# Patient Record
Sex: Male | Born: 1965 | Race: White | Hispanic: No | Smoking: Current every day smoker
Health system: Southern US, Community
[De-identification: ages and names within clinical notes are randomized; demographics above are authoritative.]

## PROBLEM LIST (undated history)

## (undated) DIAGNOSIS — F32A Depression, unspecified: Secondary | ICD-10-CM

## (undated) DIAGNOSIS — E785 Hyperlipidemia, unspecified: Secondary | ICD-10-CM

## (undated) DIAGNOSIS — K859 Acute pancreatitis without necrosis or infection, unspecified: Secondary | ICD-10-CM

## (undated) DIAGNOSIS — F419 Anxiety disorder, unspecified: Secondary | ICD-10-CM

## (undated) DIAGNOSIS — G4730 Sleep apnea, unspecified: Secondary | ICD-10-CM

## (undated) DIAGNOSIS — Z720 Tobacco use: Secondary | ICD-10-CM

## (undated) DIAGNOSIS — M1990 Unspecified osteoarthritis, unspecified site: Secondary | ICD-10-CM

## (undated) DIAGNOSIS — F329 Major depressive disorder, single episode, unspecified: Secondary | ICD-10-CM

## (undated) DIAGNOSIS — I10 Essential (primary) hypertension: Secondary | ICD-10-CM

## (undated) DIAGNOSIS — K219 Gastro-esophageal reflux disease without esophagitis: Secondary | ICD-10-CM

## (undated) HISTORY — DX: Hyperlipidemia, unspecified: E78.5

## (undated) HISTORY — PX: FRACTURE SURGERY: SHX138

## (undated) HISTORY — DX: Depression, unspecified: F32.A

## (undated) HISTORY — DX: Anxiety disorder, unspecified: F41.9

## (undated) HISTORY — PX: JOINT REPLACEMENT: SHX530

## (undated) HISTORY — DX: Gastro-esophageal reflux disease without esophagitis: K21.9

## (undated) HISTORY — DX: Acute pancreatitis without necrosis or infection, unspecified: K85.90

## (undated) HISTORY — DX: Major depressive disorder, single episode, unspecified: F32.9

## (undated) HISTORY — DX: Tobacco use: Z72.0

## (undated) HISTORY — DX: Sleep apnea, unspecified: G47.30

## (undated) HISTORY — PX: EYE SURGERY: SHX253

## (undated) HISTORY — DX: Essential (primary) hypertension: I10

## (undated) HISTORY — DX: Unspecified osteoarthritis, unspecified site: M19.90

---

## 1998-05-24 ENCOUNTER — Ambulatory Visit (HOSPITAL_BASED_OUTPATIENT_CLINIC_OR_DEPARTMENT_OTHER): Admission: RE | Admit: 1998-05-24 | Discharge: 1998-05-24 | Payer: Self-pay | Admitting: Specialist

## 2000-11-05 ENCOUNTER — Emergency Department (HOSPITAL_COMMUNITY): Admission: EM | Admit: 2000-11-05 | Discharge: 2000-11-06 | Payer: Self-pay | Admitting: Emergency Medicine

## 2000-11-14 ENCOUNTER — Encounter: Admission: RE | Admit: 2000-11-14 | Discharge: 2000-11-14 | Payer: Self-pay | Admitting: *Deleted

## 2000-11-14 ENCOUNTER — Encounter: Payer: Self-pay | Admitting: *Deleted

## 2008-02-28 ENCOUNTER — Emergency Department (HOSPITAL_COMMUNITY): Admission: EM | Admit: 2008-02-28 | Discharge: 2008-02-28 | Payer: Self-pay | Admitting: Emergency Medicine

## 2008-06-30 ENCOUNTER — Emergency Department (HOSPITAL_COMMUNITY): Admission: EM | Admit: 2008-06-30 | Discharge: 2008-06-30 | Payer: Self-pay | Admitting: Emergency Medicine

## 2009-07-07 ENCOUNTER — Encounter: Payer: Self-pay | Admitting: Emergency Medicine

## 2009-07-07 ENCOUNTER — Ambulatory Visit: Payer: Self-pay | Admitting: Family Medicine

## 2009-07-08 ENCOUNTER — Inpatient Hospital Stay (HOSPITAL_COMMUNITY): Admission: EM | Admit: 2009-07-08 | Discharge: 2009-07-09 | Payer: Self-pay | Admitting: Family Medicine

## 2010-11-09 LAB — CBC AND DIFFERENTIAL: WBC: 10.8 10^3/mL

## 2010-11-09 LAB — HEPATIC FUNCTION PANEL
ALT: 29 U/L (ref 10–40)
AST: 31 U/L (ref 14–40)
Bilirubin, Total: 0.5 mg/dL

## 2010-11-09 LAB — BASIC METABOLIC PANEL
BUN: 11 mg/dL (ref 4–21)
Creatinine: 0.8 mg/dL (ref <=1.3)
Glucose: 90 mg/dL
Sodium: 138 mmol/L (ref 137–147)

## 2010-11-09 LAB — LIPID PANEL
HDL: 38 mg/dL (ref 35–70)
LDl/HDL Ratio: 4.4

## 2011-01-20 LAB — LIPASE, BLOOD: Lipase: 1539 U/L — ABNORMAL HIGH (ref 11–59)

## 2011-01-20 LAB — CBC
HCT: 40.8 % (ref 39.0–52.0)
HCT: 33.5 % — ABNORMAL LOW (ref 39.0–52.0)
HCT: 35.1 % — ABNORMAL LOW (ref 39.0–52.0)
Hemoglobin: 14.3 g/dL (ref 13.0–17.0)
Hemoglobin: 12.4 g/dL — ABNORMAL LOW (ref 13.0–17.0)
MCHC: 35.1 g/dL (ref 30.0–36.0)
MCHC: 35.3 g/dL (ref 30.0–36.0)
MCV: 91.8 fL (ref 78.0–100.0)
MCV: 92.7 fL (ref 78.0–100.0)
MCV: 92.9 fL (ref 78.0–100.0)
Platelets: 179 10*3/uL (ref 150–400)
RBC: 4.44 MIL/uL (ref 4.22–5.81)
RBC: 3.6 MIL/uL — ABNORMAL LOW (ref 4.22–5.81)
RBC: 3.78 MIL/uL — ABNORMAL LOW (ref 4.22–5.81)
RDW: 13.2 % (ref 11.5–15.5)
RDW: 13.2 % (ref 11.5–15.5)
WBC: 16.7 10*3/uL — ABNORMAL HIGH (ref 4.0–10.5)
WBC: 10.8 10*3/uL — ABNORMAL HIGH (ref 4.0–10.5)

## 2011-01-20 LAB — DIFFERENTIAL
Basophils Absolute: 0 10*3/uL (ref 0.0–0.1)
Basophils Relative: 0 % (ref 0–1)
Lymphocytes Relative: 11 % — ABNORMAL LOW (ref 12–46)
Monocytes Absolute: 0.6 10*3/uL (ref 0.1–1.0)
Neutro Abs: 14.2 10*3/uL — ABNORMAL HIGH (ref 1.7–7.7)
Neutrophils Relative %: 85 % — ABNORMAL HIGH (ref 43–77)

## 2011-01-20 LAB — COMPREHENSIVE METABOLIC PANEL
AST: 74 U/L — ABNORMAL HIGH (ref 0–37)
Albumin: 3.8 g/dL (ref 3.5–5.2)
Albumin: 2.8 g/dL — ABNORMAL LOW (ref 3.5–5.2)
Albumin: 3.1 g/dL — ABNORMAL LOW (ref 3.5–5.2)
Alkaline Phosphatase: 143 U/L — ABNORMAL HIGH (ref 39–117)
Alkaline Phosphatase: 77 U/L (ref 39–117)
BUN: 9 mg/dL (ref 6–23)
BUN: 5 mg/dL — ABNORMAL LOW (ref 6–23)
CO2: 29 mEq/L (ref 19–32)
Calcium: 8.4 mg/dL (ref 8.4–10.5)
Chloride: 102 mEq/L (ref 96–112)
Chloride: 100 mEq/L (ref 96–112)
Creatinine, Ser: 0.82 mg/dL (ref 0.4–1.5)
Creatinine, Ser: 0.76 mg/dL (ref 0.4–1.5)
Creatinine, Ser: 0.79 mg/dL (ref 0.4–1.5)
GFR calc Af Amer: >60 mL/min (ref >60)
GFR calc non Af Amer: >60 mL/min (ref >60)
Glucose, Bld: 97 mg/dL (ref 70–99)
Glucose, Bld: 79 mg/dL (ref 70–99)
Potassium: 3.1 mEq/L — ABNORMAL LOW (ref 3.5–5.1)
Potassium: 3.9 mEq/L (ref 3.5–5.1)
Total Bilirubin: 1.6 mg/dL — ABNORMAL HIGH (ref 0.3–1.2)
Total Bilirubin: 1.2 mg/dL (ref 0.3–1.2)
Total Protein: 7.3 g/dL (ref 6.0–8.3)
Total Protein: 6.1 g/dL (ref 6.0–8.3)

## 2011-01-20 LAB — LIPID PANEL
Cholesterol: 82 mg/dL (ref 0–200)
LDL Cholesterol: 32 mg/dL (ref 0–99)
LDL Cholesterol: 7 mg/dL (ref 0–99)
Total CHOL/HDL Ratio: 3.4 RATIO
Triglycerides: 266 mg/dL — ABNORMAL HIGH (ref <150)
VLDL: 24 mg/dL (ref 0–40)
VLDL: 53 mg/dL — ABNORMAL HIGH (ref 0–40)

## 2011-01-20 LAB — URINALYSIS, ROUTINE W REFLEX MICROSCOPIC
Glucose, UA: NEGATIVE mg/dL
Hgb urine dipstick: NEGATIVE
Ketones, ur: 40 mg/dL — AB
Leukocytes, UA: NEGATIVE
Nitrite: NEGATIVE
Protein, ur: 30 mg/dL — AB
Specific Gravity, Urine: 1.031 — ABNORMAL HIGH (ref 1.005–1.030)
Urobilinogen, UA: 1 mg/dL (ref 0.0–1.0)
pH: 5.5 (ref 5.0–8.0)

## 2011-01-20 LAB — URINE MICROSCOPIC-ADD ON

## 2011-03-02 ENCOUNTER — Inpatient Hospital Stay (HOSPITAL_COMMUNITY)
Admission: EM | Admit: 2011-03-02 | Discharge: 2011-03-07 | DRG: 494 | Disposition: A | Discharge status: Home Health | Payer: PRIVATE HEALTH INSURANCE | Attending: Orthopaedic Surgery | Admitting: Orthopaedic Surgery

## 2011-03-02 ENCOUNTER — Emergency Department (HOSPITAL_COMMUNITY): Payer: PRIVATE HEALTH INSURANCE

## 2011-03-02 DIAGNOSIS — E7800 Pure hypercholesterolemia, unspecified: Secondary | ICD-10-CM | POA: Diagnosis present

## 2011-03-02 DIAGNOSIS — F411 Generalized anxiety disorder: Secondary | ICD-10-CM | POA: Diagnosis present

## 2011-03-02 DIAGNOSIS — Y92009 Unspecified place in unspecified non-institutional (private) residence as the place of occurrence of the external cause: Secondary | ICD-10-CM

## 2011-03-02 DIAGNOSIS — K219 Gastro-esophageal reflux disease without esophagitis: Secondary | ICD-10-CM | POA: Diagnosis present

## 2011-03-02 DIAGNOSIS — W03XXXA Other fall on same level due to collision with another person, initial encounter: Secondary | ICD-10-CM | POA: Diagnosis present

## 2011-03-02 DIAGNOSIS — F17200 Nicotine dependence, unspecified, uncomplicated: Secondary | ICD-10-CM | POA: Diagnosis present

## 2011-03-02 DIAGNOSIS — I10 Essential (primary) hypertension: Secondary | ICD-10-CM | POA: Diagnosis present

## 2011-03-02 DIAGNOSIS — S82109A Unspecified fracture of upper end of unspecified tibia, initial encounter for closed fracture: Principal | ICD-10-CM | POA: Diagnosis present

## 2011-03-03 ENCOUNTER — Emergency Department (HOSPITAL_COMMUNITY): Payer: PRIVATE HEALTH INSURANCE

## 2011-03-03 LAB — POCT I-STAT, CHEM 8
BUN: 10 mg/dL (ref 6–23)
Calcium, Ion: 1.06 mmol/L — ABNORMAL LOW (ref 1.12–1.32)
Chloride: 100 mEq/L (ref 96–112)
HCT: 44 % (ref 39.0–52.0)
Potassium: 3 mEq/L — ABNORMAL LOW (ref 3.5–5.1)
Sodium: 136 mEq/L (ref 135–145)

## 2011-03-03 LAB — CBC
HCT: 39.7 % (ref 39.0–52.0)
Hemoglobin: 13.7 g/dL (ref 13.0–17.0)
MCH: 30.8 pg (ref 26.0–34.0)
MCV: 89.2 fL (ref 78.0–100.0)
Platelets: 188 10*3/uL (ref 150–400)
RBC: 4.45 MIL/uL (ref 4.22–5.81)
WBC: 16.8 10*3/uL — ABNORMAL HIGH (ref 4.0–10.5)

## 2011-03-03 LAB — DIFFERENTIAL
Eosinophils Absolute: 0.1 10*3/uL (ref 0.0–0.7)
Lymphocytes Relative: 11 % — ABNORMAL LOW (ref 12–46)
Lymphs Abs: 1.9 10*3/uL (ref 0.7–4.0)
Monocytes Relative: 7 % (ref 3–12)
Neutro Abs: 13.7 10*3/uL — ABNORMAL HIGH (ref 1.7–7.7)
Neutrophils Relative %: 82 % — ABNORMAL HIGH (ref 43–77)

## 2011-03-03 LAB — ETHANOL: Alcohol, Ethyl (B): 53 mg/dL — ABNORMAL HIGH (ref 0–10)

## 2011-03-04 LAB — CBC
HCT: 38.2 % — ABNORMAL LOW (ref 39.0–52.0)
MCV: 89.7 fL (ref 78.0–100.0)
RBC: 4.26 MIL/uL (ref 4.22–5.81)
RDW: 12.8 % (ref 11.5–15.5)
WBC: 13.8 10*3/uL — ABNORMAL HIGH (ref 4.0–10.5)

## 2011-03-04 LAB — BASIC METABOLIC PANEL
BUN: 5 mg/dL — ABNORMAL LOW (ref 6–23)
Chloride: 96 mEq/L (ref 96–112)
GFR calc non Af Amer: >60 mL/min (ref >60)
Glucose, Bld: 102 mg/dL — ABNORMAL HIGH (ref 70–99)
Potassium: 3.6 mEq/L (ref 3.5–5.1)
Sodium: 133 mEq/L — ABNORMAL LOW (ref 135–145)

## 2011-03-05 ENCOUNTER — Inpatient Hospital Stay (HOSPITAL_COMMUNITY): Payer: PRIVATE HEALTH INSURANCE

## 2011-03-05 LAB — PROTIME-INR: Prothrombin Time: 13.4 seconds (ref 11.6–15.2)

## 2011-03-06 LAB — PROTIME-INR
INR: 1.11 (ref 0.00–1.49)
Prothrombin Time: 14.5 seconds (ref 11.6–15.2)

## 2011-03-07 LAB — PROTIME-INR: Prothrombin Time: 19.3 seconds — ABNORMAL HIGH (ref 11.6–15.2)

## 2011-03-11 NOTE — Discharge Summary (Signed)
  NAMEJARAN, Tanner Gomez                  ACCOUNT NO.:  [id number]  MEDICAL RECORD NO.:  [id number]           PATIENT TYPE:  I  LOCATION:  1612                         FACILITY:  [facility]  PHYSICIAN:  Vanita Panda. Magnus Ivan, M.D.DATE OF BIRTH:  06/14/1966  DATE OF ADMISSION:  03/02/2011 DATE OF DISCHARGE:  03/07/2011                              DISCHARGE SUMMARY   ADMITTING DIAGNOSIS:  Left knee bicondylar tibial plateau fracture.  DISCHARGE DIAGNOSIS:  Left knee bicondylar tibial plateau fracture.  PROCEDURES:  Open reduction and internal fixation of left knee, complex bicondylar tibial plateau fracture on Mar 05, 2011.  HOSPITAL COURSE:  [name] is a 45 year old gentleman who was admitted to the emergency room after having sustained severely displaced impacted bicondylar tibial plateau fracture when he was wrestling a friend.  We waited 2 days to take him to the operating room to have the soft tissue swelling to decrease.  He was taken to the operating room on Mar 05, 2011, where he underwent a successful open reduction and internal fixation of his bicondylar tibial plateau fracture.  He then began working with physical therapy with nonweightbearing on his left lower extremity and was placed in a Bledsoe hinged knee brace.  By the day of discharge, incision was clean, dry, and intact.  He was tolerating physical therapy as well as oral pain medications.  He was thought he could be discharged safely to home.  DISPOSITION:  Discharged to home.  DISCHARGE INSTRUCTIONS:  While he is at home, he will keep his incision clean and dry.  He can get it wet in the shower starting Friday, Mar 10, 2011.  Physical Therapy will come in to work on balance, gait training, coordination with nonweightbearing of his left lower extremity.  A followup appointment should be established in our office in 2 weeks.  DISCHARGE MEDICATIONS:  Prescription given as follows, 1. Percocet. 2. Robaxin. 3.  Xarelto.  Continue home medications including alprazolam, amlodipine, atenolol, Niaspan, pravastatin, and Prilosec.     Vanita Panda. Magnus Ivan, M.D.     CYB/MEDQ  D:  03/07/2011  T:  03/08/2011  Job:  [id number]  Electronically Signed by Doneen Poisson M.D. on 03/11/2011 11:10:32 AM

## 2011-03-11 NOTE — Op Note (Signed)
NAMELYRIQ, JARCHOW                  ACCOUNT NO.:  [id number]  MEDICAL RECORD NO.:  [id number]           PATIENT TYPE:  I  LOCATION:  1612                         FACILITY:  [facility]  PHYSICIAN:  Vanita Panda. Magnus Ivan, M.D.DATE OF BIRTH:  10/11/1966  DATE OF PROCEDURE:  03/05/2011 DATE OF DISCHARGE:                              OPERATIVE REPORT   PREOPERATIVE DIAGNOSIS:  Left knee bicondylar tibial plateau fracture.  POSTOPERATIVE DIAGNOSIS:  Left knee bicondylar tibial plateau fracture.  PROCEDURE:  Open reduction and internal fixation of left knee bicondylar tibial plateau fracture with lateral and medial plating.  SURGEON:  Vanita Panda. Magnus Ivan, M.D.  ANESTHESIA:  General.  ESTIMATED BLOOD LOSS:  Less than 100 cc.  TOURNIQUET TIME:  113 minutes.  ANTIBIOTICS:  1 g of IV Ancef.  COMPLICATIONS:  None.  INDICATIONS:  Mr. Mcwhirter is a 45 year old gentleman who was wrestling in the ER with a friend of his who was obese.  They tangled each other and the friend landed on him and his knee popped.  He was seen in the [facility] Emergency Room and found to have a significantly comminuted intra- articular bicondylar tibial plateau fracture.  Due to the comminuted nature of this fracture and the swelling, we admitted him as an inpatient, so we could observe soft tissue swelling to make sure he did not develop a compartment syndrome.  He is now presenting for definitive fixation of the fracture.  The risks and benefits of this have been explained to him in detail and he understands the reason behind proceeding with surgery.  PROCEDURE DESCRIPTION:  Informed consent was obtained, appropriate left knee was marked.  He was brought to the operating room, placed supine on a flat Jackson radiolucent table.  General anesthesia was then obtained and a sterile tourniquet was placed around his upper left thigh and his left leg was prepped and draped with DuraPrep and sterile  drapes including sterile stockinette.  A time-out was called, identified the correct patient, correct left knee.  I then used Esmarch wrap on the leg and the tourniquet was inflated to 300 mm of pressure.  First, I made a lateral incision along the lateral plateau, the level of the joint line down distal.  I dissected down to the soft tissues.  I did not perform an arthrotomy at all.  I was able to access the fracture and use a Freer to tap up any posterior joint line in the central area of the lateral condyle.  I then made a separate stab incision on the medial side and using Darrick Penna reduction forceps.  I was able to reduce the fracture under visualization indirect fluoroscope and guidance.  This was reviewed in the AP and lateral planes.  I then chose a Smith and Nephew 3.5 mm periarticular locking plate and fashioned this along the lateral cortex of the tibial plateau and was secured this proximally and distally.  Attention was then turned to the medial side of the knee, I made a posteromedial incision.  I was able to meticulously dissect down to interval the hamstrings just anterior in the area  of the PES.  I was able to then place a Smith and Nephew buttress type of T plate with 2 screws proximal and 2 screws distal with buttress effect for the posteromedial aspect to hopefully keep these from following into a varus position.  Again, this was all performed under direct fluoroscopic guidance.  I then copiously irrigated the 2 incisions.  I closed the deep tissue with #1 Vicryl followed by 0-Vicryl, 2-0 Vicryl, and interrupted 2-0 nylon on both skin incisions.  Xeroform followed by a well-padded sterile dressings and plaster splint with medial and lateral slabs placed.  The tourniquet was let down.  Of note, the toes had pinked up nicely, he had a palpable pulse in this foot.  He was awakened, extubated, and taken to the recovery room in stable condition. All final counts were  correct and there were no complications noted. Postoperatively, he will start getting followup with Physical Therapy and Occupational Therapy with nonweightbearing on his left lower extremity.  We will also then get him into a type of Bledsoe hinged knee brace knee brace.     Vanita Panda. Magnus Ivan, M.D.     CYB/MEDQ  D:  03/05/2011  T:  03/06/2011  Job:  [id number]  Electronically Signed by Doneen Poisson M.D. on 03/11/2011 11:10:30 AM

## 2011-07-12 LAB — CBC
HCT: 39.9
Hemoglobin: 14
MCHC: 35.2
MCV: 90.3
Platelets: 227
RBC: 4.42
RDW: 12.8
WBC: 22.8 — ABNORMAL HIGH

## 2011-07-12 LAB — URINALYSIS, ROUTINE W REFLEX MICROSCOPIC
Glucose, UA: NEGATIVE
Hgb urine dipstick: NEGATIVE
Leukocytes, UA: NEGATIVE
Specific Gravity, Urine: 1.02
pH: 6

## 2011-07-12 LAB — DIFFERENTIAL
Basophils Relative: 0
Eosinophils Relative: 1
Lymphs Abs: 2.1
Monocytes Relative: 3
Neutro Abs: 19.8 — ABNORMAL HIGH

## 2011-07-12 LAB — COMPREHENSIVE METABOLIC PANEL
ALT: 49
AST: 57 — ABNORMAL HIGH
Albumin: 4
CO2: 24
Calcium: 9.3
GFR calc Af Amer: >60
GFR calc non Af Amer: >60
Sodium: 137
Total Protein: 7.6

## 2011-07-12 LAB — URINE MICROSCOPIC-ADD ON

## 2011-07-17 LAB — COMPREHENSIVE METABOLIC PANEL
AST: 46 — ABNORMAL HIGH
Albumin: 4.3
BUN: 13
Chloride: 98
Creatinine, Ser: 1.03
GFR calc Af Amer: >60
Potassium: 4.1
Total Bilirubin: 0.6
Total Protein: 7.9

## 2011-07-17 LAB — URINALYSIS, ROUTINE W REFLEX MICROSCOPIC
Bilirubin Urine: NEGATIVE
Glucose, UA: NEGATIVE
Hgb urine dipstick: NEGATIVE
Specific Gravity, Urine: 1.013
pH: 6

## 2011-07-17 LAB — DIFFERENTIAL
Basophils Absolute: 0.1
Eosinophils Relative: 1
Lymphocytes Relative: 11 — ABNORMAL LOW
Monocytes Absolute: 0.7
Monocytes Relative: 4
Neutro Abs: 16.2 — ABNORMAL HIGH

## 2011-07-17 LAB — CBC
MCV: 91.9
Platelets: 243
RDW: 13
WBC: 19.4 — ABNORMAL HIGH

## 2011-11-09 ENCOUNTER — Encounter: Payer: Self-pay | Admitting: Internal Medicine

## 2011-11-09 DIAGNOSIS — F329 Major depressive disorder, single episode, unspecified: Secondary | ICD-10-CM | POA: Insufficient documentation

## 2011-11-09 DIAGNOSIS — F17200 Nicotine dependence, unspecified, uncomplicated: Secondary | ICD-10-CM

## 2011-11-09 DIAGNOSIS — F32A Depression, unspecified: Secondary | ICD-10-CM | POA: Insufficient documentation

## 2011-11-09 DIAGNOSIS — E781 Pure hyperglyceridemia: Secondary | ICD-10-CM | POA: Insufficient documentation

## 2011-11-09 DIAGNOSIS — K859 Acute pancreatitis without necrosis or infection, unspecified: Secondary | ICD-10-CM | POA: Insufficient documentation

## 2011-11-09 DIAGNOSIS — E785 Hyperlipidemia, unspecified: Secondary | ICD-10-CM | POA: Insufficient documentation

## 2011-11-09 DIAGNOSIS — K219 Gastro-esophageal reflux disease without esophagitis: Secondary | ICD-10-CM | POA: Insufficient documentation

## 2011-11-09 DIAGNOSIS — I10 Essential (primary) hypertension: Secondary | ICD-10-CM | POA: Insufficient documentation

## 2011-11-09 DIAGNOSIS — M109 Gout, unspecified: Secondary | ICD-10-CM | POA: Insufficient documentation

## 2011-11-15 ENCOUNTER — Encounter: Payer: Self-pay | Admitting: Internal Medicine

## 2011-11-15 ENCOUNTER — Ambulatory Visit (INDEPENDENT_AMBULATORY_CARE_PROVIDER_SITE_OTHER): Payer: PRIVATE HEALTH INSURANCE | Admitting: Internal Medicine

## 2011-11-15 VITALS — BP 135/96 | HR 72 | Temp 97.8°F | Resp 18 | Ht 68.5 in | Wt 202.0 lb

## 2011-11-15 DIAGNOSIS — K219 Gastro-esophageal reflux disease without esophagitis: Secondary | ICD-10-CM

## 2011-11-15 DIAGNOSIS — E785 Hyperlipidemia, unspecified: Secondary | ICD-10-CM

## 2011-11-15 DIAGNOSIS — I10 Essential (primary) hypertension: Secondary | ICD-10-CM

## 2011-11-15 DIAGNOSIS — F17200 Nicotine dependence, unspecified, uncomplicated: Secondary | ICD-10-CM

## 2011-11-15 DIAGNOSIS — F32A Depression, unspecified: Secondary | ICD-10-CM

## 2011-11-15 DIAGNOSIS — E291 Testicular hypofunction: Secondary | ICD-10-CM

## 2011-11-15 DIAGNOSIS — F329 Major depressive disorder, single episode, unspecified: Secondary | ICD-10-CM

## 2011-11-15 LAB — COMPREHENSIVE METABOLIC PANEL
Albumin: 4.8 g/dL (ref 3.5–5.2)
BUN: 10 mg/dL (ref 6–23)
Calcium: 9.9 mg/dL (ref 8.4–10.5)
Chloride: 101 mEq/L (ref 96–112)
Creat: 0.84 mg/dL (ref 0.50–1.35)
Glucose, Bld: 101 mg/dL — ABNORMAL HIGH (ref 70–99)
Potassium: 4 mEq/L (ref 3.5–5.3)

## 2011-11-15 LAB — CBC WITH DIFFERENTIAL/PLATELET
Eosinophils Absolute: 0.3 10*3/uL (ref 0.0–0.7)
Hemoglobin: 15.8 g/dL (ref 13.0–17.0)
Lymphs Abs: 3 10*3/uL (ref 0.7–4.0)
MCH: 32 pg (ref 26.0–34.0)
Neutro Abs: 6.6 10*3/uL (ref 1.7–7.7)
Neutrophils Relative %: 61 % (ref 43–77)
Platelets: 218 10*3/uL (ref 150–400)
RBC: 4.94 MIL/uL (ref 4.22–5.81)
WBC: 10.8 10*3/uL — ABNORMAL HIGH (ref 4.0–10.5)

## 2011-11-15 LAB — LIPID PANEL
Cholesterol: 187 mg/dL (ref 0–200)
Total CHOL/HDL Ratio: 4.9 Ratio
Triglycerides: 310 mg/dL — ABNORMAL HIGH (ref <150)

## 2011-11-15 MED ORDER — ATENOLOL 100 MG PO TABS: 100.0000 mg | ORAL_TABLET | Freq: Every day | ORAL | Status: DC

## 2011-11-15 MED ORDER — AMLODIPINE BESYLATE 10 MG PO TABS: 10.0000 mg | ORAL_TABLET | Freq: Every day | ORAL | Status: DC

## 2011-11-15 MED ORDER — OMEPRAZOLE 40 MG PO CPDR: 40.0000 mg | DELAYED_RELEASE_CAPSULE | Freq: Every day | ORAL | Status: DC

## 2011-11-15 MED ORDER — NIACIN ER (ANTIHYPERLIPIDEMIC) 1000 MG PO TBCR: 1000.0000 mg | EXTENDED_RELEASE_TABLET | Freq: Every day | ORAL | Status: DC

## 2011-11-15 MED ORDER — PRAVASTATIN SODIUM 40 MG PO TABS: 40.0000 mg | ORAL_TABLET | Freq: Every day | ORAL | Status: DC

## 2011-11-15 NOTE — Patient Instructions (Signed)
Nicotine Addiction Nicotine can act as both a stimulant (excites/activates) and a sedative (calms/quiets). Immediately after exposure to nicotine, there is a "kick" caused in part by the drug's stimulation of the adrenal glands and resulting discharge of adrenaline (epinephrine). The rush of adrenaline stimulates the body and causes a sudden release of sugar. This means that smokers are always slightly hyperglycemic. Hyperglycemic means that the blood sugar is high, just like in diabetics. Nicotine also decreases the amount of insulin which helps control sugar levels in the body. There is an increase in blood pressure, breathing, and the rate of heart beats.  In addition, nicotine indirectly causes a release of dopamine in the brain that controls pleasure and motivation. A similar reaction is seen with other drugs of abuse, such as cocaine and heroin. This dopamine release is thought to cause the pleasurable sensations when smoking. In some different cases, nicotine can also create a calming effect, depending on sensitivity of the smoker's nervous system and the dose of nicotine taken. WHAT HAPPENS WHEN NICOTINE IS TAKEN FOR LONG PERIODS OF TIME?  Long-term use of nicotine results in addiction. It is difficult to stop.   Repeated use of nicotine creates tolerance. Higher doses of nicotine are needed to get the "kick."  When nicotine use is stopped, withdrawal may last a month or more. Withdrawal may begin within a few hours after the last cigarette. Symptoms peak within the first few days and may lessen within a few weeks. For some people, however, symptoms may last for months or longer. Withdrawal symptoms include:   Irritability.   Craving.   Learning and attention deficits.   Sleep disturbances.   Increased appetite.  Craving for tobacco may last for 6 months or longer. Many behaviors done while using nicotine can also play a part in the severity of withdrawal symptoms. For some people, the  feel, smell, and sight of a cigarette and the ritual of obtaining, handling, lighting, and smoking the cigarette are closely linked with the pleasure of smoking. When stopped, they also miss the related behaviors which make the withdrawal or craving worse. While nicotine gum and patches may lessen the drug aspects of withdrawal, cravings often persist. WHAT ARE THE MEDICAL CONSEQUENCES OF NICOTINE USE?  Nicotine addiction accounts for one-third of all cancers. The top cancer caused by tobacco is lung cancer. Lung cancer is the number one cancer killer of both men and women.   Smoking is also associated with cancers of the:   Mouth.   Pharynx.   Larynx.   Esophagus.   Stomach.   Pancreas.   Cervix.   Kidney.   Ureter.   Bladder.   Smoking also causes lung diseases such as lasting (chronic) bronchitis and emphysema.   It worsens asthma in adults and children.   Smoking increases the risk of heart disease, including:   Stroke.   Heart attack.   Vascular disease.   Aneurysm.   Passive or secondary smoke can also increase medical risks including:   Asthma in children.   Sudden Infant Death Syndrome (SIDS).   Additionally, dropped cigarettes are the leading cause of residential fire fatalities.   Nicotine poisoning has been reported from accidental ingestion of tobacco products by children and pets. Death usually results in a few minutes from respiratory failure (when a person stops breathing) caused by paralysis.  TREATMENT   Medication. Nicotine replacement medicines such as nicotine gum and the patch are used to stop smoking. These medicines gradually lower the dosage   of nicotine in the body. These medicines do not contain the carbon monoxide and other toxins found in tobacco smoke.   Hypnotherapy.   Relaxation therapy.   Nicotine Anonymous (a 12-step support program). Find times and locations in your local yellow pages.  Document Released: 06/07/2004 Document  Revised: 06/14/2011 Document Reviewed: 10/30/2007 Exit[employer] Patient Information 2012 [employer]. 

## 2011-11-15 NOTE — Progress Notes (Signed)
  Subjective:    Patient ID: Tanner Gomez, male    DOB: 1966/08/27, 46 y.o.   MRN: [id number]  HPI Tanner Gomez is here for followup of his chronic problems. He is still out of work recovering from knee surgery by Dr. Rayburn Ma. He has been relat and has not been able to lose weight. He has had no gallop since he's been out of work. His hypertension reflux and hyperlipidemia have apparently been in control during this time.he has a new complaint of a loss of interest in sex. His girlfriend is currently being evaluated for Crohn's disease and this may be part of the problem. 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 It will be difficult for him to stop doing the heavy work and just be a Merchandiser, retail because of his nature     Review of Systems Subjective:    Tanner Gomez is here for follow-up of his hypertension. His high blood pressure was first noted at doctor's office several years ago. Home blood pressure readings: not doing. Salt intake and diet: salt not added to cooking. Usual weight: 200#. Associated signs and symptoms: none. Patient denies: blurred vision, chest pain, dyspnea, neck aches, palpitations and peripheral edema. Use of agents associated with hypertension:      Review of Systems Genitourinary:positive for libido changes Musculoskeletal:positive for recent surgery with prolonged recovery Behavioral/Psych: negative for Problems related to his anxiety. He has not overused alcohol. He has not been depressed.     Objective:    Physical Exam: BP 135/96  Pulse 72  Temp(Src) 97.8 F (36.6 C) (Oral)  Resp 18  Ht 5' 8.5" (1.74 m)  Wt 202 lb (91.627 kg)  BMI 30.27 kg/m2 General appearance: alert, cooperative and appears older than stated age Head: Normocephalic, without obvious abnormality, atraumatic Eyes: conjunctivae/corneas clear. PERRL, EOM's intact. Fundi benign. Neck: no adenopathy, no carotid bruit, no  JVD, supple, symmetrical, trachea midline and thyroid not enlarged, symmetric, no tenderness/mass/nodules Lungs: clear to auscultation bilaterally Heart: regular rate and rhythm, S1, S2 normal, no murmur, click, rub or gallop Abdomen: soft, non-tender; bowel sounds normal; no masses,  no organomegaly  Lab Review  Abstract on 11/09/2011  Component Date Value  . WBC 11/09/2010 10.8   . Glucose 11/09/2010 90   . BUN 11/09/2010 11   . Creatinine 11/09/2010 0.8   . Sodium 11/09/2010 138   . LDl/HDL Ratio 11/09/2010 4.4   . Triglycerides 11/09/2010 349*  . Cholesterol 11/09/2010 166   . HDL 11/09/2010 38   . LDL Cholesterol 11/09/2010 58   . Alkaline Phosphatase 11/09/2010 66   . ALT 11/09/2010 29   . AST 11/09/2010 31   . Bilirubin, Total 11/09/2010 0.5        Assessment:    Hypertension, stage 1 in fair control. Evidence of target organ damage: none. Hyperlipidemia no change in labs checked  Depression with anxiety stable GERD stable Smoker unwilling to quit Possible hypergonadism with labs being checked   Plan:    Medication: no change. Regular aerobic exercise.      Objective:   Physical Exam         Assessment & Plan:

## 2011-11-16 LAB — TESTOSTERONE, FREE, TOTAL, SHBG
Sex Hormone Binding: 44 nmol/L (ref 13–71)
Testosterone, Free: 138.2 pg/mL (ref 47.0–244.0)
Testosterone-% Free: 1.9 % (ref 1.6–2.9)

## 2011-11-20 ENCOUNTER — Other Ambulatory Visit: Payer: Self-pay | Admitting: Internal Medicine

## 2011-11-20 ENCOUNTER — Encounter: Payer: Self-pay | Admitting: Internal Medicine

## 2011-12-01 ENCOUNTER — Encounter: Payer: Self-pay | Admitting: Internal Medicine

## 2012-02-13 ENCOUNTER — Other Ambulatory Visit: Payer: Self-pay | Admitting: *Deleted

## 2012-02-13 MED ORDER — ALPRAZOLAM 1 MG PO TABS: 1.0000 mg | ORAL_TABLET | Freq: Three times a day (TID) | ORAL | Status: DC | PRN

## 2012-03-18 ENCOUNTER — Other Ambulatory Visit: Payer: Self-pay | Admitting: Physician Assistant

## 2012-03-18 MED ORDER — ALPRAZOLAM 1 MG PO TABS: 1.0000 mg | ORAL_TABLET | Freq: Three times a day (TID) | ORAL | Status: DC | PRN

## 2012-05-16 ENCOUNTER — Other Ambulatory Visit: Payer: Self-pay | Admitting: Physician Assistant

## 2012-05-16 MED ORDER — ALPRAZOLAM 1 MG PO TABS: ORAL_TABLET | ORAL | Status: DC

## 2012-07-16 ENCOUNTER — Ambulatory Visit (INDEPENDENT_AMBULATORY_CARE_PROVIDER_SITE_OTHER): Payer: BC Managed Care – PPO | Admitting: Internal Medicine

## 2012-07-16 VITALS — BP 124/80 | HR 79 | Temp 98.5°F | Resp 16 | Ht 68.0 in | Wt 208.0 lb

## 2012-07-16 DIAGNOSIS — N481 Balanitis: Secondary | ICD-10-CM

## 2012-07-16 DIAGNOSIS — N476 Balanoposthitis: Secondary | ICD-10-CM

## 2012-07-16 DIAGNOSIS — F411 Generalized anxiety disorder: Secondary | ICD-10-CM | POA: Insufficient documentation

## 2012-07-16 DIAGNOSIS — F17200 Nicotine dependence, unspecified, uncomplicated: Secondary | ICD-10-CM

## 2012-07-16 MED ORDER — FLUCONAZOLE 150 MG PO TABS: 150.0000 mg | ORAL_TABLET | Freq: Once | ORAL | Status: DC

## 2012-07-16 MED ORDER — KETOCONAZOLE 2 % EX CREA: TOPICAL_CREAM | Freq: Every day | CUTANEOUS | Status: DC

## 2012-07-16 MED ORDER — ALPRAZOLAM 1 MG PO TABS: 1.0000 mg | ORAL_TABLET | Freq: Three times a day (TID) | ORAL | Status: DC | PRN

## 2012-07-16 NOTE — Progress Notes (Signed)
  Subjective:    Patient ID: Tanner Gomez, male    DOB: [birth year], 46 y.o.   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 Balanitis  fluconazole (DIFLUCAN) 150 MG tablet, ketoconazole (NIZORAL) 2 % cream  2. GAD (generalized anxiety disorder)  ALPRAZolam (XANAX) 1 MG tablet  3. Active smoker     Meds ordered this encounter  Medications  . fluconazole (DIFLUCAN) 150 MG tablet    Sig: Take 1 tablet (150 mg total) by mouth once.    Dispense:  1 tablet    Refill:  0  . ketoconazole (NIZORAL) 2 % cream    Sig: Apply topically daily. For at least one month    Dispense:  15 g    Refill:  2  . ALPRAZolam (XANAX) 1 MG tablet    Sig: Take 1 tablet (1 mg total) by mouth 3 (three) times daily as needed for anxiety.    Dispense:  90 tablet    Refill:  3   Followup physical examination in January 104 appt made Recheck #1 if not well in one month

## 2012-07-19 NOTE — Progress Notes (Signed)
CPE scheduled for 1/8 with Dr. Merla Riches.

## 2012-09-07 ENCOUNTER — Other Ambulatory Visit: Payer: Self-pay | Admitting: Internal Medicine

## 2012-10-23 ENCOUNTER — Ambulatory Visit (INDEPENDENT_AMBULATORY_CARE_PROVIDER_SITE_OTHER): Payer: BC Managed Care – PPO | Admitting: Internal Medicine

## 2012-10-23 ENCOUNTER — Encounter: Payer: Self-pay | Admitting: Internal Medicine

## 2012-10-23 ENCOUNTER — Telehealth: Payer: Self-pay | Admitting: *Deleted

## 2012-10-23 VITALS — BP 132/88 | HR 74 | Temp 98.1°F | Resp 16 | Ht 68.0 in | Wt 203.8 lb

## 2012-10-23 DIAGNOSIS — M25569 Pain in unspecified knee: Secondary | ICD-10-CM

## 2012-10-23 DIAGNOSIS — Z0000 Encounter for general adult medical examination without abnormal findings: Secondary | ICD-10-CM

## 2012-10-23 DIAGNOSIS — K219 Gastro-esophageal reflux disease without esophagitis: Secondary | ICD-10-CM

## 2012-10-23 DIAGNOSIS — E785 Hyperlipidemia, unspecified: Secondary | ICD-10-CM

## 2012-10-23 DIAGNOSIS — B351 Tinea unguium: Secondary | ICD-10-CM

## 2012-10-23 DIAGNOSIS — N481 Balanitis: Secondary | ICD-10-CM

## 2012-10-23 DIAGNOSIS — I10 Essential (primary) hypertension: Secondary | ICD-10-CM

## 2012-10-23 DIAGNOSIS — F411 Generalized anxiety disorder: Secondary | ICD-10-CM

## 2012-10-23 LAB — CBC WITH DIFFERENTIAL/PLATELET
Basophils Absolute: 0 10*3/uL (ref 0.0–0.1)
Basophils Relative: 0 % (ref 0–1)
Eosinophils Absolute: 0.2 10*3/uL (ref 0.0–0.7)
Eosinophils Relative: 1 % (ref 0–5)
MCH: 31.7 pg (ref 26.0–34.0)
MCHC: 35.6 g/dL (ref 30.0–36.0)
MCV: 89.1 fL (ref 78.0–100.0)
Platelets: 246 10*3/uL (ref 150–400)
RDW: 13.2 % (ref 11.5–15.5)

## 2012-10-23 LAB — POCT URINALYSIS DIPSTICK
Glucose, UA: NEGATIVE
Leukocytes, UA: NEGATIVE
Nitrite, UA: NEGATIVE
Protein, UA: NEGATIVE
Urobilinogen, UA: 0.2

## 2012-10-23 LAB — COMPREHENSIVE METABOLIC PANEL
Albumin: 4.7 g/dL (ref 3.5–5.2)
Alkaline Phosphatase: 63 U/L (ref 39–117)
CO2: 24 mEq/L (ref 19–32)
Chloride: 101 mEq/L (ref 96–112)
Glucose, Bld: 85 mg/dL (ref 70–99)
Potassium: 4.1 mEq/L (ref 3.5–5.3)
Sodium: 136 mEq/L (ref 135–145)
Total Protein: 7.6 g/dL (ref 6.0–8.3)

## 2012-10-23 LAB — LIPID PANEL: Total CHOL/HDL Ratio: 4.8 Ratio

## 2012-10-23 MED ORDER — ALPRAZOLAM 1 MG PO TABS: 1.0000 mg | ORAL_TABLET | Freq: Three times a day (TID) | ORAL | Status: DC | PRN

## 2012-10-23 MED ORDER — NIACIN ER (ANTIHYPERLIPIDEMIC) 1000 MG PO TBCR: 1000.0000 mg | EXTENDED_RELEASE_TABLET | Freq: Every day | ORAL | Status: DC

## 2012-10-23 MED ORDER — AMLODIPINE BESYLATE 10 MG PO TABS: 10.0000 mg | ORAL_TABLET | Freq: Every day | ORAL | Status: DC

## 2012-10-23 MED ORDER — OMEPRAZOLE 40 MG PO CPDR: 40.0000 mg | DELAYED_RELEASE_CAPSULE | Freq: Every day | ORAL | Status: DC

## 2012-10-23 MED ORDER — IBUPROFEN 800 MG PO TABS: 800.0000 mg | ORAL_TABLET | Freq: Three times a day (TID) | ORAL | Status: DC | PRN

## 2012-10-23 MED ORDER — PRAVASTATIN SODIUM 40 MG PO TABS: 40.0000 mg | ORAL_TABLET | Freq: Every day | ORAL | Status: DC

## 2012-10-23 MED ORDER — KETOCONAZOLE 2 % EX CREA: TOPICAL_CREAM | Freq: Every day | CUTANEOUS | Status: DC

## 2012-10-23 MED ORDER — ATENOLOL 100 MG PO TABS: 100.0000 mg | ORAL_TABLET | Freq: Every day | ORAL | Status: DC

## 2012-10-23 MED ORDER — TERBINAFINE HCL 250 MG PO TABS: 250.0000 mg | ORAL_TABLET | Freq: Every day | ORAL | Status: DC

## 2012-10-23 NOTE — Telephone Encounter (Signed)
Xanax RX faxed to pharmacy ([facility]) in [place] and confirmation page rec'd [phone].

## 2012-10-23 NOTE — Patient Instructions (Signed)
Open patellar knee brace or patellar sleeve

## 2012-10-23 NOTE — Progress Notes (Signed)
  Subjective:    Patient ID: Tanner Gomez, male    DOB: 05-Feb-1966, 47 y.o.   MRN: [id number]  HPI    Review of Systems  Constitutional: Negative.   HENT: Negative.   Eyes: Positive for visual disturbance.  Respiratory: Negative.   Cardiovascular: Negative.   Gastrointestinal: Negative.   Genitourinary: Positive for penile pain.  Musculoskeletal: Positive for arthralgias.  Skin: Negative.   Neurological: Negative.   Hematological: Negative.   Psychiatric/Behavioral: Negative.        Objective:   Physical Exam        Assessment & Plan:

## 2012-10-23 NOTE — Progress Notes (Signed)
Subjective:    Patient ID: Tanner Gomez, male    DOB: 1965/12/25, 47 y.o.   MRN: [id number]  HPIannual Patient Active Problem List  Diagnosis  . HTN (hypertension)  . Hyperlipidemia  . Active smoker-down to 1/2 pack-trying  . GERD (gastroesophageal reflux disease)-stable on meds  . LFT elevation  . Depression-stable  . Gout-none this year  . Acute pancreatitis-no probs now off etoh  . GAD (generalized anxiety disorder)-stable on meds  Current outpatient prescriptions: ALPRAZolam (XANAX) 1 MG tablet, Take 1 tablet (1 mg total) by mouth 3 (three) times daily as needed for anxiety amLODipine (NORVASC) 10 MG tablet, Take 1 tablet (10 mg total) by mouth daily. atenolol (TENORMIN) 100 MG tablet, Take 1 tablet (100 mg total) by mouth daily. ibuprofen (ADVIL,MOTRIN) 800 MG tablet, Take 1 tablet (800 mg total) by mouth every 8 (eight) hours as needed  niacin (NIASPAN) 1000 MG CR tablet, Take 1 tablet (1,000 mg total) by mouth daily omeprazole (PRILOSEC) 40 MG capsule, Take 1 capsule (40 mg total) by mouth daily.  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       Decreased belt size!  HENT: Positive for neck stiffness. Negative for hearing loss, congestion, trouble swallowing and tinnitus.   Eyes: Negative for visual disturbance.  Respiratory: Negative for cough, shortness of breath and wheezing.   Cardiovascular: Negative for chest pain, palpitations and leg swelling.  Gastrointestinal: Negative for abdominal pain, diarrhea and constipation.  Genitourinary: Negative for frequency and difficulty urinating.   Musculoskeletal: Negative for joint swelling.  Neurological: Negative for tremors, speech difficulty, weakness and headaches.  Hematological: Negative for adenopathy. Does not bruise/bleed easily.  Psychiatric/Behavioral: Negative for suicidal ideas, behavioral problems, sleep disturbance and dysphoric mood.       Objective:   Physical Exam  Constitutional: He is oriented to person, place, and time. He appears well-developed and well-nourished. No distress.  HENT:  Right Ear: External ear normal.  Left Ear: External ear normal.  Nose: Nose normal.  Mouth/Throat: Oropharynx is clear and moist.       Teeth poor dentition with chipping fronts and several caries/no destroyed teeth,gum disease  Eyes: Conjunctivae normal and EOM are normal. Pupils are equal, round, and reactive to light.  Neck: Normal range of motion. Neck supple. No thyromegaly present.  Cardiovascular: Normal rate, regular rhythm, normal heart sounds and intact distal pulses.  Exam reveals no gallop.   No murmur heard. Pulmonary/Chest: Effort normal and breath sounds normal. He has no wheezes. He has no rales.  Abdominal: Soft. Bowel sounds are normal. He exhibits no distension and no mass. There is no tenderness.  Genitourinary:       No balanitis today  Musculoskeletal: He exhibits no edema.       L knee with tenderness laterally at prox fibula/no laxity w/ stressors  Lymphadenopathy:    He has no cervical adenopathy.  Neurological: He is alert and oriented to person, place, and time. He has normal reflexes. No cranial nerve deficit.  Skin: No rash noted.       All toenails thickened-#1's markedly so /nails loose/tender to [employer]  Psychiatric: He has a  normal mood and affect. His behavior is normal. Judgment and thought content normal.   Filed Vitals:   10/23/12 1101  BP: 132/88  Pulse: 74  Temp: 98.1 F (36.7 C)  Resp: 16  wt unchanged at 203         Results for orders placed in visit on 10/23/12  POCT  GLYCOSYLATED HEMOGLOBIN (HGB A1C)      Component Value Range   Hemoglobin A1C 5.6    POCT URINALYSIS DIPSTICK      Component Value Range   Color, UA yellow     Clarity, UA clear     Glucose, UA neg     Bilirubin, UA neg     Ketones, UA neg     Spec Grav, UA 1.010     Blood, UA neg     pH, UA 6.0     Protein, UA neg     Urobilinogen, UA 0.2     Nitrite, UA neg     Leukocytes, UA Negative      Assessment & Plan:   1. Annual physical exam   2. Balanitis -recurrent  3. HTN (hypertension)   4. Hyperlipidemia   5. Onychomycosis -start rx-83mos then call if not controlled/consider 2nd 3 mos vs removal  6. GAD (generalized anxiety disorder)   7. GERD (gastroesophageal reflux disease)   8. Hyperlipemia   9. Knee pain   10. Poor dentition--referred Meds ordered this encounter  Medications  . ketoconazole (NIZORAL) 2 % cream    Sig: Apply topically daily. For at least one month    Dispense:  30 g    Refill:  2  . terbinafine (LAMISIL) 250 MG tablet    Sig: Take 1 tablet (250 mg total) by mouth daily.    Dispense:  90 tablet    Refill:  0  . ALPRAZolam (XANAX) 1 MG tablet    Sig: Take 1 tablet (1 mg total) by mouth 3 (three) times daily as needed for anxiety.    Dispense:  90 tablet    Refill:  5  . omeprazole (PRILOSEC) 40 MG capsule    Sig: Take 1 capsule (40 mg total) by mouth daily.    Dispense:  90 capsule    Refill:  3  . niacin (NIASPAN) 1000 MG CR tablet    Sig: Take 1 tablet (1,000 mg total) by mouth daily.    Dispense:  90 tablet    Refill:  3  . pravastatin (PRAVACHOL) 40 MG tablet    Sig: Take 1 tablet (40 mg total) by mouth daily.    Dispense:  90 tablet    Refill:  3  . atenolol (TENORMIN) 100 MG tablet    Sig: Take 1 tablet (100 mg total) by mouth daily.    Dispense:  90 tablet    Refill:  3  . amLODipine (NORVASC) 10 MG tablet    Sig: Take 1 tablet (10 mg total) by mouth daily.    Dispense:  90 tablet    Refill:  3  . ibuprofen (ADVIL,MOTRIN) 800 MG  tablet    Sig: Take 1 tablet (800 mg total) by mouth every 8 (eight) hours as needed.    Dispense:  270 tablet    Refill:  3   Contact w/ labs

## 2012-10-24 ENCOUNTER — Encounter: Payer: Self-pay | Admitting: Internal Medicine

## 2012-10-25 ENCOUNTER — Encounter: Payer: Self-pay | Admitting: Internal Medicine

## 2012-12-02 ENCOUNTER — Other Ambulatory Visit: Payer: Self-pay | Admitting: Internal Medicine

## 2012-12-04 ENCOUNTER — Other Ambulatory Visit: Payer: Self-pay | Admitting: Internal Medicine

## 2012-12-10 ENCOUNTER — Other Ambulatory Visit: Payer: Self-pay | Admitting: Internal Medicine

## 2013-01-25 ENCOUNTER — Other Ambulatory Visit: Payer: Self-pay | Admitting: Internal Medicine

## 2013-01-28 ENCOUNTER — Telehealth: Payer: Self-pay

## 2013-01-28 NOTE — Telephone Encounter (Signed)
PATIENT HAS PRESCRIPTION REFILL QUESTIONS PLEASE CALL HIM AT [phone]

## 2013-01-29 NOTE — Telephone Encounter (Signed)
Called patient. He is requesting refill on Lamisil advised he needs labs for continuation of this medication.

## 2013-05-16 ENCOUNTER — Other Ambulatory Visit: Payer: Self-pay | Admitting: Internal Medicine

## 2013-05-17 NOTE — Telephone Encounter (Signed)
Forward to Dr. Doolittle 

## 2013-05-18 ENCOUNTER — Other Ambulatory Visit: Payer: Self-pay | Admitting: Radiology

## 2013-09-10 ENCOUNTER — Other Ambulatory Visit: Payer: Self-pay | Admitting: Physician Assistant

## 2013-09-10 ENCOUNTER — Other Ambulatory Visit: Payer: Self-pay | Admitting: Internal Medicine

## 2013-10-11 ENCOUNTER — Other Ambulatory Visit: Payer: Self-pay | Admitting: Internal Medicine

## 2013-10-12 ENCOUNTER — Other Ambulatory Visit: Payer: Self-pay | Admitting: Physician Assistant

## 2013-10-13 ENCOUNTER — Other Ambulatory Visit: Payer: Self-pay | Admitting: Internal Medicine

## 2013-10-13 NOTE — Telephone Encounter (Signed)
Dr Merla Riches, pt is overdue for f/up and has been given notices on last RFs. He does have an appt set up for 12/24/13. Do you want to RF these until then? I have pended for your review.

## 2013-10-19 ENCOUNTER — Other Ambulatory Visit: Payer: Self-pay | Admitting: Internal Medicine

## 2013-10-20 NOTE — Telephone Encounter (Signed)
OK to RF for 90 days? Pt has appt 12/24/13.

## 2013-10-20 NOTE — Telephone Encounter (Signed)
OK to RF 90 day? Pt has appt 12/24/13.

## 2013-10-28 ENCOUNTER — Other Ambulatory Visit: Payer: Self-pay | Admitting: Internal Medicine

## 2013-12-07 ENCOUNTER — Other Ambulatory Visit: Payer: Self-pay | Admitting: Physician Assistant

## 2013-12-24 ENCOUNTER — Ambulatory Visit (INDEPENDENT_AMBULATORY_CARE_PROVIDER_SITE_OTHER): Payer: BC Managed Care – PPO | Admitting: Internal Medicine

## 2013-12-24 ENCOUNTER — Other Ambulatory Visit: Payer: Self-pay | Admitting: Radiology

## 2013-12-24 ENCOUNTER — Ambulatory Visit: Payer: BC Managed Care – PPO

## 2013-12-24 ENCOUNTER — Encounter: Payer: Self-pay | Admitting: Internal Medicine

## 2013-12-24 VITALS — BP 140/91 | HR 66 | Temp 98.7°F | Resp 16 | Ht 67.5 in | Wt 197.0 lb

## 2013-12-24 DIAGNOSIS — N481 Balanitis: Secondary | ICD-10-CM

## 2013-12-24 DIAGNOSIS — I10 Essential (primary) hypertension: Secondary | ICD-10-CM

## 2013-12-24 DIAGNOSIS — B351 Tinea unguium: Secondary | ICD-10-CM

## 2013-12-24 DIAGNOSIS — K219 Gastro-esophageal reflux disease without esophagitis: Secondary | ICD-10-CM

## 2013-12-24 DIAGNOSIS — E785 Hyperlipidemia, unspecified: Secondary | ICD-10-CM

## 2013-12-24 DIAGNOSIS — F32A Depression, unspecified: Secondary | ICD-10-CM

## 2013-12-24 DIAGNOSIS — Z0000 Encounter for general adult medical examination without abnormal findings: Secondary | ICD-10-CM

## 2013-12-24 DIAGNOSIS — R7989 Other specified abnormal findings of blood chemistry: Secondary | ICD-10-CM

## 2013-12-24 DIAGNOSIS — R059 Cough, unspecified: Secondary | ICD-10-CM

## 2013-12-24 DIAGNOSIS — R05 Cough: Secondary | ICD-10-CM

## 2013-12-24 DIAGNOSIS — F17200 Nicotine dependence, unspecified, uncomplicated: Secondary | ICD-10-CM

## 2013-12-24 DIAGNOSIS — R945 Abnormal results of liver function studies: Secondary | ICD-10-CM

## 2013-12-24 DIAGNOSIS — F411 Generalized anxiety disorder: Secondary | ICD-10-CM

## 2013-12-24 DIAGNOSIS — F329 Major depressive disorder, single episode, unspecified: Secondary | ICD-10-CM

## 2013-12-24 LAB — POCT URINALYSIS DIPSTICK
BILIRUBIN UA: NEGATIVE
Blood, UA: NEGATIVE
Glucose, UA: NEGATIVE
KETONES UA: NEGATIVE
LEUKOCYTES UA: NEGATIVE
Nitrite, UA: NEGATIVE
PH UA: 6.5
Protein, UA: NEGATIVE
SPEC GRAV UA: 1.01
Urobilinogen, UA: 0.2

## 2013-12-24 LAB — POCT GLYCOSYLATED HEMOGLOBIN (HGB A1C): Hemoglobin A1C: 5.3

## 2013-12-24 LAB — CBC WITH DIFFERENTIAL/PLATELET
BASOS PCT: 0 % (ref 0–1)
Basophils Absolute: 0 10*3/uL (ref 0.0–0.1)
Eosinophils Absolute: 0.2 10*3/uL (ref 0.0–0.7)
Eosinophils Relative: 2 % (ref 0–5)
HEMATOCRIT: 43.8 % (ref 39.0–52.0)
HEMOGLOBIN: 15.2 g/dL (ref 13.0–17.0)
Lymphocytes Relative: 24 % (ref 12–46)
Lymphs Abs: 2.4 10*3/uL (ref 0.7–4.0)
MCH: 31.4 pg (ref 26.0–34.0)
MCHC: 34.7 g/dL (ref 30.0–36.0)
MCV: 90.5 fL (ref 78.0–100.0)
MONO ABS: 0.9 10*3/uL (ref 0.1–1.0)
MONOS PCT: 9 % (ref 3–12)
Neutro Abs: 6.5 10*3/uL (ref 1.7–7.7)
Neutrophils Relative %: 65 % (ref 43–77)
Platelets: 247 10*3/uL (ref 150–400)
RBC: 4.84 MIL/uL (ref 4.22–5.81)
RDW: 13.3 % (ref 11.5–15.5)
WBC: 10 10*3/uL (ref 4.0–10.5)

## 2013-12-24 MED ORDER — ALPRAZOLAM 1 MG PO TABS: ORAL_TABLET | ORAL | Status: DC

## 2013-12-24 MED ORDER — ATENOLOL 100 MG PO TABS: ORAL_TABLET | ORAL | Status: DC

## 2013-12-24 MED ORDER — IBUPROFEN 800 MG PO TABS: 800.0000 mg | ORAL_TABLET | Freq: Three times a day (TID) | ORAL | Status: DC | PRN

## 2013-12-24 MED ORDER — NIACIN ER (ANTIHYPERLIPIDEMIC) 1000 MG PO TBCR: EXTENDED_RELEASE_TABLET | ORAL | Status: DC

## 2013-12-24 MED ORDER — TERBINAFINE HCL 250 MG PO TABS: 250.0000 mg | ORAL_TABLET | Freq: Every day | ORAL | Status: DC

## 2013-12-24 MED ORDER — OMEPRAZOLE 40 MG PO CPDR: 40.0000 mg | DELAYED_RELEASE_CAPSULE | Freq: Every day | ORAL | Status: DC

## 2013-12-24 MED ORDER — TERBINAFINE HCL 250 MG PO TABS
250.0000 mg | ORAL_TABLET | Freq: Every day | ORAL | Status: DC
Start: 2013-12-24 — End: 2013-12-24

## 2013-12-24 MED ORDER — AMLODIPINE BESYLATE 10 MG PO TABS: 10.0000 mg | ORAL_TABLET | Freq: Every day | ORAL | Status: DC

## 2013-12-24 MED ORDER — KETOCONAZOLE 2 % EX CREA: TOPICAL_CREAM | Freq: Every day | CUTANEOUS | Status: DC

## 2013-12-24 MED ORDER — PRAVASTATIN SODIUM 40 MG PO TABS: ORAL_TABLET | ORAL | Status: DC

## 2013-12-24 MED ORDER — AMLODIPINE BESYLATE 10 MG PO TABS
10.0000 mg | ORAL_TABLET | Freq: Every day | ORAL | Status: DC
Start: 2013-12-24 — End: 2013-12-24

## 2013-12-24 NOTE — Progress Notes (Signed)
This chart was scribed for Tonye Pearsonobert P Adalid Beckmann, MD by Joaquin MusicKristina Sanchez-Matthews, ED Scribe. This patient was seen in room Room/bed 27 and the patient's care was started at 12:00 PM. Subjective:    Patient ID: Tanner Gomez, male    DOB: 01-23-1966, 48 y.o.   MRN: [id number] Chief Complaint  Patient presents with  . Annual Exam   HPI Tanner Gomez is a 48 y.o. male with a hx of smoking who presents [facility] for F/U apt and annual exam. Pt states his job is going well but states [place]. Pt states he wakes up at 4 am and generally gets home at 8 pm. 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 Pt states he also has problems with his girlfriend whom has Crohn's disease. Pt states he drinks water frequently daily.  Pt states he generally goes into deep sleep but states he constantly is thinking of his job while working. He states he was having CP "a while back" but reports taking Asprin with relief. He states he was having chest tightness that caused SOB. He denies becoming SOB when working excessively. He reports being nasally congested for the past week. Pt denies coughing at night. Pt denies having trouble with digestion.  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 Pt states he has some relief while taking Ibuprofen.  Pt states he has been having visual complications. He states he has near sighted complications. Pt states he has been using OTC [employer] glasses but states he "still has some problems seeing". 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  Pt states his Xanax has been working well and reports taking it during the day. He states he has not considered taking Xanax during the night. He reports having deep sleep and denies having any problems sleeping.  Pt states he  has been taking all his medications as prescribed. He reports taking Ibuprofen PRN. Pt has requested to have a refill of the topical ointment he uses PRN for her recurrent rash.   Patient Active Problem List   Diagnosis Date Noted  . GAD (generalized anxiety disorder)---Stress from family and work though medicines helpful  07/16/2012  . HTN (hypertension) 11/09/2011  . Hyperlipidemia 11/09/2011  . Current smoker--- unable to quit /has no resolution  11/09/2011  . GERD (gastroesophageal reflux disease)--stable  11/09/2011  . LFT elevation--needs rechecking  11/09/2011  . Depression--- stable on meds  11/09/2011  . Gout--not recently active  11/09/2011  . Acute pancreatitis--- has never recurred  11/09/2011   Current Outpatient Prescriptions on File Prior to Visit  Medication Sig Dispense Refill  . ALPRAZolam (XANAX) 1 MG tablet TAKE 1 TABLET BY MOUTH 3 TIMES DAILY AS NEEDED FOR ANXIETY  90 tablet  2  . amLODipine (NORVASC) 10 MG tablet Take 1 tablet (10 mg total) by mouth daily.  90 tablet  3  . amLODipine (NORVASC) 10 MG tablet Take 1 tablet (10 mg total) by mouth daily. PATIENT NEEDS OFFICE VISIT FOR ADDITIONAL REFILLS  30 tablet  0  . atenolol (TENORMIN) 100 MG tablet TAKE 1 TABLET BY MOUTH ONCE DAILY  90 tablet  0  . fluconazole (DIFLUCAN) 150 MG tablet Take 1 tablet (150 mg total) by mouth once.  1 tablet  0  . ibuprofen (ADVIL,MOTRIN) 800 MG tablet Take 1 tablet (800 mg total) by mouth every 8 (eight) hours as needed.  270 tablet  3  . ketoconazole (NIZORAL) 2 % cream Apply topically daily. For at least one month  30 g  2  . niacin (NIASPAN) 1000 MG CR tablet TAKE 1 TABLET BY MOUTH EVERY DAY  90 tablet  0  . omeprazole (PRILOSEC) 40 MG capsule Take 1 capsule (40 mg total) by mouth daily.  90 capsule  3  . omeprazole (PRILOSEC) 40 MG capsule Take 1 capsule (40 mg total) by mouth daily. PATIENT NEEDS OFFICE VISIT FOR ADDITIONAL REFILLS  30 capsule  0  . pravastatin (PRAVACHOL) 40 MG  tablet TAKE 1 TABLET BY MOUTH EVERY DAY  90 tablet  0  . terbinafine (LAMISIL) 250 MG tablet Take 1 tablet (250 mg total) by mouth daily.  90 tablet  0   No current facility-administered medications on file prior to visit.   Review of Systems  Constitutional: Positive for fatigue and unexpected weight change.  HENT: Positive for dental problem.   Eyes: Positive for visual disturbance.  Respiratory: Positive for chest tightness.   Cardiovascular: Positive for leg swelling.  Musculoskeletal: Positive for back pain and neck stiffness.  Psychiatric/Behavioral: Positive for sleep disturbance.  All other systems reviewed and are negative.   Objective:   Physical Exam  Nursing note and vitals reviewed. Constitutional: He is oriented to person, place, and time. He appears well-developed and well-nourished. No distress.  HENT:  Head: Normocephalic and atraumatic.  Right Ear: External ear normal.  Left Ear: External ear normal.  Mouth/Throat: Oropharynx is clear and moist. No oropharyngeal exudate.  Dental decay along front upper teeth.  Eyes: Conjunctivae and EOM are normal. Pupils are equal, round, and reactive to light.  Neck: Normal range of motion. Neck supple. No thyromegaly present.  Cardiovascular: Normal rate, regular rhythm, normal heart sounds and normal pulses.  Exam reveals no gallop and no friction rub.   No murmur heard. Pulmonary/Chest: Effort normal. He has no wheezes. He has rhonchi.  Rhonchi in R posterior and anterior lobe. No wheezing on forced expiration.  Abdominal: Bowel sounds are normal. He exhibits no mass. There is no rebound and no guarding.  Musculoskeletal: Normal range of motion. He exhibits no tenderness.  Fungal involvement of all toenails.  Neurological: He is alert and oriented to person, place, and time. He has normal reflexes.  Skin: Skin is warm and dry. He is not diaphoretic.  Psychiatric: He has a normal mood and affect. His behavior is normal.  Judgment and thought content normal.   Results for orders placed in visit on 12/24/13  POCT GLYCOSYLATED HEMOGLOBIN (HGB A1C)      Result Value Ref Range   Hemoglobin A1C 5.3    POCT URINALYSIS DIPSTICK      Result Value Ref Range   Color, UA yellow     Clarity, UA clear     Glucose, UA neg     Bilirubin, UA neg     Ketones, UA neg     Spec Grav, UA 1.010     Blood, UA neg     pH, UA 6.5     Protein, UA neg     Urobilinogen, UA 0.2     Nitrite, UA neg     Leukocytes, UA Negative     [facility] preliminary x-ray report read by Dr. Ellamae Sia: CXR clear.    Assessment & Plan:  I have completed the patient encounter in its entirety as documented by the scribe, with editing by me where necessary. Zaynab Chipman P. Merla Riches, M.D. HTN (hypertension) - Plan: CBC with Differential, COMPLETE METABOLIC PANEL WITH GFR, DG Chest 2 View, DISCONTINUED: atenolol (TENORMIN) 100 MG tablet  Hyperlipidemia - Plan: Lipid panel, DISCONTINUED: niacin (NIASPAN) 1000 MG CR tablet, DISCONTINUED: pravastatin (PRAVACHOL) 40 MG tablet  LFT elevation  Routine general medical examination at a health care facility - Plan: POCT glycosylated hemoglobin (Hb A1C), POCT urinalysis dipstick, CBC with Differential, Lipid panel, COMPLETE METABOLIC PANEL WITH GFR, PSA  GERD (gastroesophageal reflux disease) - Plan: DISCONTINUED: omeprazole (PRILOSEC) 40 MG capsule  GAD (generalized anxiety disorder) - Plan: ALPRAZolam (XANAX) 1 MG tablet  Depression  Current smoker-not ready to quit at  Balanitis -intermittent/recurrent/responsive to treatment Plan:  ketoconazole (NIZORAL) 2 % cream  Onychomycosis/all toes - Plan: 3 month supply terbinafine (LAMISIL) 250 MG tablet  Cough-secondary nicotine addiction  Meds ordered this encounter  Medications  . ALPRAZolam (XANAX) 1 MG tablet    Sig: TAKE 1 TABLET BY MOUTH 3 TIMES DAILY AS NEEDED FOR ANXIETY    Dispense:  90 tablet    Refill:  5  .  amLODipine (NORVASC) 10 MG  tablet    Sig: Take 1 tablet (10 mg total) by mouth daily.    Dispense:  90 tablet    Refill:  3  . DISCONTD: atenolol (TENORMIN) 100 MG tablet    Sig: TAKE 1 TABLET BY MOUTH ONCE DAILY    Dispense:  90 tablet    Refill:  3  . ibuprofen (ADVIL,MOTRIN) 800 MG tablet    Sig: Take 1 tablet (800 mg total) by mouth every 8 (eight) hours as needed.    Dispense:  270 tablet    Refill:  3  .  ketoconazole (NIZORAL) 2 % cream    Sig: Apply topically daily.    Dispense:  30 g    Refill:  5  .  niacin (NIASPAN) 1000 MG CR tablet    Sig: TAKE 1 TABLET BY MOUTH EVERY DAY    Dispense:  90 tablet    Refill:  3  .  omeprazole (PRILOSEC) 40 MG capsule    Sig: Take 1 capsule (40 mg total) by mouth daily.    Dispense:  90 capsule    Refill:  3  .  pravastatin (PRAVACHOL) 40 MG tablet    Sig: TAKE 1 TABLET BY MOUTH EVERY DAY    Dispense:  90 tablet    Refill:  3  .  terbinafine (LAMISIL) 250 MG tablet    Sig: Take 1 tablet (250 mg total) by mouth daily. For 3 months    Dispense:  90 tablet    Refill:  0

## 2013-12-24 NOTE — Progress Notes (Signed)
   Subjective:    Patient ID: Tanner RyderLarry W Gomez, male    DOB: [birth year], 48 y.o.   MRN: [id number]  HPI    Review of Systems  Constitutional: Positive for fatigue.  HENT: Positive for dental problem.   Eyes: Positive for visual disturbance.  Respiratory: Positive for chest tightness.   Cardiovascular: Positive for leg swelling.  Gastrointestinal: Negative.   Endocrine: Negative.   Genitourinary: Negative.   Musculoskeletal: Positive for back pain and neck stiffness.  Allergic/Immunologic: Negative.   Neurological: Negative.   Hematological: Negative.   Psychiatric/Behavioral: Positive for sleep disturbance.       Objective:   Physical Exam        Assessment & Plan:

## 2013-12-25 ENCOUNTER — Telehealth: Payer: Self-pay | Admitting: Radiology

## 2013-12-25 LAB — COMPLETE METABOLIC PANEL WITH GFR
ALT: 17 U/L (ref 0–53)
AST: 22 U/L (ref 0–37)
Albumin: 4.5 g/dL (ref 3.5–5.2)
Alkaline Phosphatase: 57 U/L (ref 39–117)
BILIRUBIN TOTAL: 0.5 mg/dL (ref 0.2–1.2)
BUN: 9 mg/dL (ref 6–23)
CO2: 24 mEq/L (ref 19–32)
Calcium: 9.6 mg/dL (ref 8.4–10.5)
Chloride: 103 mEq/L (ref 96–112)
Creat: 0.91 mg/dL (ref 0.50–1.35)
GFR, Est African American: >89 mL/min
GFR, Est Non African American: >89 mL/min
Glucose, Bld: 88 mg/dL (ref 70–99)
Potassium: 3.9 mEq/L (ref 3.5–5.3)
Sodium: 138 mEq/L (ref 135–145)
Total Protein: 7.5 g/dL (ref 6.0–8.3)

## 2013-12-25 LAB — LIPID PANEL
Cholesterol: 162 mg/dL (ref 0–200)
HDL: 38 mg/dL — AB (ref >39)
LDL CALC: 72 mg/dL (ref 0–99)
Total CHOL/HDL Ratio: 4.3 Ratio
Triglycerides: 258 mg/dL — ABNORMAL HIGH (ref <150)
VLDL: 52 mg/dL — AB (ref 0–40)

## 2013-12-25 LAB — PSA: PSA: 0.21 ng/mL (ref <=4.00)

## 2013-12-25 NOTE — Telephone Encounter (Signed)
Faxed wellness form  To [facility] with labs per patient and Dr Merla Richesoolittle.

## 2014-01-09 ENCOUNTER — Telehealth: Payer: Self-pay

## 2014-01-09 NOTE — Telephone Encounter (Signed)
Pt called needing his labs faxed to [phone] for his insurance and I mailed him a copy too

## 2014-01-12 ENCOUNTER — Encounter: Payer: Self-pay | Admitting: Internal Medicine

## 2014-06-24 ENCOUNTER — Encounter: Payer: Self-pay | Admitting: Internal Medicine

## 2014-06-24 ENCOUNTER — Ambulatory Visit (INDEPENDENT_AMBULATORY_CARE_PROVIDER_SITE_OTHER): Payer: BC Managed Care – PPO | Admitting: Internal Medicine

## 2014-06-24 VITALS — BP 128/81 | HR 65 | Temp 97.9°F | Resp 16 | Ht 68.0 in | Wt 195.0 lb

## 2014-06-24 DIAGNOSIS — N529 Male erectile dysfunction, unspecified: Secondary | ICD-10-CM

## 2014-06-24 DIAGNOSIS — F17200 Nicotine dependence, unspecified, uncomplicated: Secondary | ICD-10-CM

## 2014-06-24 DIAGNOSIS — F32A Depression, unspecified: Secondary | ICD-10-CM

## 2014-06-24 DIAGNOSIS — K219 Gastro-esophageal reflux disease without esophagitis: Secondary | ICD-10-CM

## 2014-06-24 DIAGNOSIS — I10 Essential (primary) hypertension: Secondary | ICD-10-CM

## 2014-06-24 DIAGNOSIS — R945 Abnormal results of liver function studies: Secondary | ICD-10-CM

## 2014-06-24 DIAGNOSIS — F411 Generalized anxiety disorder: Secondary | ICD-10-CM

## 2014-06-24 DIAGNOSIS — R7989 Other specified abnormal findings of blood chemistry: Secondary | ICD-10-CM

## 2014-06-24 DIAGNOSIS — M1A9XX0 Chronic gout, unspecified, without tophus (tophi): Secondary | ICD-10-CM

## 2014-06-24 DIAGNOSIS — N521 Erectile dysfunction due to diseases classified elsewhere: Secondary | ICD-10-CM

## 2014-06-24 DIAGNOSIS — F3289 Other specified depressive episodes: Secondary | ICD-10-CM

## 2014-06-24 DIAGNOSIS — F329 Major depressive disorder, single episode, unspecified: Secondary | ICD-10-CM

## 2014-06-24 DIAGNOSIS — M1A00X0 Idiopathic chronic gout, unspecified site, without tophus (tophi): Secondary | ICD-10-CM

## 2014-06-24 DIAGNOSIS — Z23 Encounter for immunization: Secondary | ICD-10-CM

## 2014-06-24 DIAGNOSIS — E785 Hyperlipidemia, unspecified: Secondary | ICD-10-CM

## 2014-06-24 MED ORDER — ALPRAZOLAM 1 MG PO TABS: ORAL_TABLET | ORAL | Status: DC

## 2014-06-24 MED ORDER — SILDENAFIL CITRATE 100 MG PO TABS
50.0000 mg | ORAL_TABLET | Freq: Every day | ORAL | Status: DC | PRN
Start: 2014-06-24 — End: 2014-12-30

## 2014-06-24 NOTE — Progress Notes (Signed)
Subjective:    Patient ID: Tanner Gomez, male    DOB: 05/30/1966, 48 y.o.   MRN: [id number]  This chart was scribed for Tonye Pearson, MD by Tonye Royalty, ED Scribe. This patient was seen in room 27 and the patient's care was started at 11:24 AM.   HPI HPI Comments: Tanner Gomez is a 48 y.o. male who presents to the [facility] for 6 month follow-up and medication refill. He reports that his brother recently passed away but states that he has been okay. He states he takes usually 2.5 doses of Xanax a day. He denies problems with any other medications. He denies pancreas flare-up, particular problems from GERD, or particular hypertension. No gout.  He reports a decrease in libido, which he states may be associated with his brother's passing or his work. GF struggling w/ crohns.  He states his work has moved from [place] to [place] so it is closer, but still reports that it is strenuous. He reports decreased smoking, less than 1 pack a day.  Patient Active Problem List   Diagnosis Date Noted  . GAD (generalized anxiety disorder) 07/16/2012  . HTN (hypertension) 11/09/2011  . Hyperlipidemia 11/09/2011  . Current smoker 11/09/2011  . GERD (gastroesophageal reflux disease) 11/09/2011  . LFT elevation 11/09/2011  . Depression 11/09/2011  . Gout 11/09/2011  . Acute pancreatitis 11/09/2011   Prior to Admission medications   Medication Sig Start Date End Date Taking? Authorizing Provider  ALPRAZolam Prudy Feeler) 1 MG tablet TAKE 1 TABLET BY MOUTH 3 TIMES DAILY AS NEEDED FOR ANXIETY 12/24/13  Yes Tonye Pearson, MD  amLODipine (NORVASC) 10 MG tablet Take 1 tablet (10 mg total) by mouth daily. 12/24/13  Yes Tonye Pearson, MD  atenolol (TENORMIN) 100 MG tablet TAKE 1 TABLET BY MOUTH ONCE DAILY 12/24/13  Yes Tonye Pearson, MD  ibuprofen (ADVIL,MOTRIN) 800 MG tablet Take 1 tablet (800 mg total) by mouth every 8 (eight) hours as needed. 12/24/13  Yes Tonye Pearson,  MD  ketoconazole (NIZORAL) 2 % cream Apply topically daily. 12/24/13  Yes Tonye Pearson, MD  niacin (NIASPAN) 1000 MG CR tablet TAKE 1 TABLET BY MOUTH EVERY DAY 12/24/13  Yes Tonye Pearson, MD  omeprazole (PRILOSEC) 40 MG capsule Take 1 capsule (40 mg total) by mouth daily. 12/24/13  Yes Tonye Pearson, MD  pravastatin (PRAVACHOL) 40 MG tablet TAKE 1 TABLET BY MOUTH EVERY DAY 12/24/13  Yes Tonye Pearson, MD  terbinafine (LAMISIL) 250 MG tablet Take 1 tablet (250 mg total) by mouth daily. For 3 months 12/24/13  Yes Tonye Pearson, MD     Review of Systems  Gastrointestinal:       Denies pancreas flare-up  Psychiatric/Behavioral: Negative for behavioral problems and sleep disturbance.       Decreased libido       Objective:   Physical Exam  Nursing note and vitals reviewed. Constitutional: He is oriented to person, place, and time. He appears well-developed and well-nourished. No distress.  HENT:  Head: Normocephalic and atraumatic.  Eyes: Conjunctivae are normal. Pupils are equal, round, and reactive to light.  Neck: Normal range of motion. Neck supple.  Cardiovascular: Normal rate, regular rhythm and normal heart sounds.   No murmur heard. Pulmonary/Chest: Effort normal and breath sounds normal. No respiratory distress. He has no wheezes. He has no rales.  Musculoskeletal: Normal range of motion. He exhibits no edema.  Neurological: He is alert  and oriented to person, place, and time.  Skin: Skin is warm and dry.  Psychiatric: He has a normal mood and affect.          Assessment & Plan:   I have completed the patient encounter in its entirety as documented by the scribe, with editing by me where necessary. Latrina Guttman P. Merla Riches, M.D. Essential hypertension  Need for prophylactic vaccination and inoculation against influenza - Plan: Flu Vaccine QUAD 36+ mos IM  Current smoker  Depression  GAD (generalized anxiety disorder) - Plan: ALPRAZolam (XANAX) 1  MG tablet  Gastroesophageal reflux disease without esophagitis  Chronic gout without tophus, unspecified cause, unspecified site - Plan: Uric Acid  Hyperlipidemia - Plan: Lipid panel  LFT elevation - Plan: Hepatitis C Antibody  Erectile dysfunction due to diseases classified elsewhere - Plan: Testosterone, free, total  Meds ordered this encounter  Medications  . ALPRAZolam (XANAX) 1 MG tablet    Sig: TAKE 1 TABLET BY MOUTH 3 TIMES DAILY AS NEEDED FOR ANXIETY    Dispense:  90 tablet    Refill:  5  . sildenafil (VIAGRA) 100 MG tablet    Sig: Take 0.5-1 tablets (50-100 mg total) by mouth daily as needed for erectile dysfunction.    Dispense:  5 tablet    Refill:  0

## 2014-06-25 LAB — TESTOSTERONE, FREE, TOTAL, SHBG
SEX HORMONE BINDING: 41 nmol/L (ref 13–71)
TESTOSTERONE: 756 ng/dL (ref 300–890)
Testosterone, Free: 149.9 pg/mL (ref 47.0–244.0)
Testosterone-% Free: 2 % (ref 1.6–2.9)

## 2014-06-25 LAB — LIPID PANEL
Cholesterol: 190 mg/dL (ref 0–200)
HDL: 45 mg/dL (ref >39)
LDL Cholesterol: 78 mg/dL (ref 0–99)
Total CHOL/HDL Ratio: 4.2 Ratio
Triglycerides: 333 mg/dL — ABNORMAL HIGH (ref <150)
VLDL: 67 mg/dL — AB (ref 0–40)

## 2014-06-25 LAB — HEPATITIS C ANTIBODY: HCV AB: NEGATIVE

## 2014-06-25 LAB — URIC ACID: Uric Acid, Serum: 4.6 mg/dL (ref 4.0–7.8)

## 2014-07-01 ENCOUNTER — Encounter: Payer: Self-pay | Admitting: Internal Medicine

## 2014-09-12 ENCOUNTER — Other Ambulatory Visit: Payer: Self-pay | Admitting: Internal Medicine

## 2014-12-02 ENCOUNTER — Other Ambulatory Visit: Payer: Self-pay | Admitting: Internal Medicine

## 2014-12-30 ENCOUNTER — Encounter: Payer: Self-pay | Admitting: Family Medicine

## 2014-12-30 ENCOUNTER — Ambulatory Visit (INDEPENDENT_AMBULATORY_CARE_PROVIDER_SITE_OTHER): Payer: BLUE CROSS/BLUE SHIELD | Admitting: Family Medicine

## 2014-12-30 ENCOUNTER — Encounter: Payer: BC Managed Care – PPO | Admitting: Internal Medicine

## 2014-12-30 VITALS — BP 123/80 | HR 73 | Temp 97.8°F | Resp 16 | Ht 68.0 in | Wt 210.8 lb

## 2014-12-30 DIAGNOSIS — F411 Generalized anxiety disorder: Secondary | ICD-10-CM

## 2014-12-30 DIAGNOSIS — K219 Gastro-esophageal reflux disease without esophagitis: Secondary | ICD-10-CM | POA: Diagnosis not present

## 2014-12-30 DIAGNOSIS — I10 Essential (primary) hypertension: Secondary | ICD-10-CM

## 2014-12-30 DIAGNOSIS — Z0000 Encounter for general adult medical examination without abnormal findings: Secondary | ICD-10-CM

## 2014-12-30 DIAGNOSIS — E785 Hyperlipidemia, unspecified: Secondary | ICD-10-CM

## 2014-12-30 DIAGNOSIS — F17200 Nicotine dependence, unspecified, uncomplicated: Secondary | ICD-10-CM

## 2014-12-30 DIAGNOSIS — G8929 Other chronic pain: Secondary | ICD-10-CM

## 2014-12-30 DIAGNOSIS — Z139 Encounter for screening, unspecified: Secondary | ICD-10-CM | POA: Diagnosis not present

## 2014-12-30 DIAGNOSIS — E669 Obesity, unspecified: Secondary | ICD-10-CM

## 2014-12-30 DIAGNOSIS — Z720 Tobacco use: Secondary | ICD-10-CM

## 2014-12-30 DIAGNOSIS — M25562 Pain in left knee: Secondary | ICD-10-CM

## 2014-12-30 DIAGNOSIS — Z125 Encounter for screening for malignant neoplasm of prostate: Secondary | ICD-10-CM | POA: Diagnosis not present

## 2014-12-30 DIAGNOSIS — Z1389 Encounter for screening for other disorder: Secondary | ICD-10-CM

## 2014-12-30 LAB — POCT URINALYSIS DIPSTICK
BILIRUBIN UA: NEGATIVE
Blood, UA: NEGATIVE
GLUCOSE UA: NEGATIVE
KETONES UA: NEGATIVE
Leukocytes, UA: NEGATIVE
Nitrite, UA: NEGATIVE
SPEC GRAV UA: 1.015
Urobilinogen, UA: 0.2
pH, UA: 5

## 2014-12-30 LAB — COMPREHENSIVE METABOLIC PANEL
ALK PHOS: 50 U/L (ref 39–117)
ALT: 31 U/L (ref 0–53)
AST: 36 U/L (ref 0–37)
Albumin: 4.4 g/dL (ref 3.5–5.2)
BUN: 11 mg/dL (ref 6–23)
CHLORIDE: 104 meq/L (ref 96–112)
CO2: 23 mEq/L (ref 19–32)
CREATININE: 0.85 mg/dL (ref 0.50–1.35)
Calcium: 8.9 mg/dL (ref 8.4–10.5)
Glucose, Bld: 83 mg/dL (ref 70–99)
POTASSIUM: 3.7 meq/L (ref 3.5–5.3)
Sodium: 134 mEq/L — ABNORMAL LOW (ref 135–145)
TOTAL PROTEIN: 7.3 g/dL (ref 6.0–8.3)
Total Bilirubin: 0.4 mg/dL (ref 0.2–1.2)

## 2014-12-30 LAB — LIPID PANEL
Cholesterol: 230 mg/dL — ABNORMAL HIGH (ref 0–200)
HDL: 24 mg/dL — AB (ref >=40)
TRIGLYCERIDES: 826 mg/dL — AB (ref <150)
Total CHOL/HDL Ratio: 9.6 Ratio

## 2014-12-30 LAB — CBC
HCT: 43.1 % (ref 39.0–52.0)
Hemoglobin: 14.5 g/dL (ref 13.0–17.0)
MCH: 31.4 pg (ref 26.0–34.0)
MCHC: 33.6 g/dL (ref 30.0–36.0)
MCV: 93.3 fL (ref 78.0–100.0)
MPV: 10.2 fL (ref 8.6–12.4)
PLATELETS: 255 10*3/uL (ref 150–400)
RBC: 4.62 MIL/uL (ref 4.22–5.81)
RDW: 13.6 % (ref 11.5–15.5)
WBC: 7.3 10*3/uL (ref 4.0–10.5)

## 2014-12-30 MED ORDER — AMLODIPINE BESYLATE 10 MG PO TABS
10.0000 mg | ORAL_TABLET | Freq: Every day | ORAL | Status: DC
Start: 2014-12-30 — End: 2015-06-30

## 2014-12-30 MED ORDER — ATENOLOL 100 MG PO TABS: ORAL_TABLET | ORAL | Status: DC

## 2014-12-30 MED ORDER — NIACIN ER (ANTIHYPERLIPIDEMIC) 1000 MG PO TBCR: EXTENDED_RELEASE_TABLET | ORAL | Status: DC

## 2014-12-30 MED ORDER — OMEPRAZOLE 40 MG PO CPDR: 40.0000 mg | DELAYED_RELEASE_CAPSULE | Freq: Every day | ORAL | Status: DC

## 2014-12-30 MED ORDER — ALPRAZOLAM 1 MG PO TABS: ORAL_TABLET | ORAL | Status: DC

## 2014-12-30 MED ORDER — PRAVASTATIN SODIUM 40 MG PO TABS: ORAL_TABLET | ORAL | Status: DC

## 2014-12-30 MED ORDER — MELOXICAM 7.5 MG PO TABS: 7.5000 mg | ORAL_TABLET | Freq: Every day | ORAL | Status: DC

## 2014-12-30 NOTE — Patient Instructions (Signed)
Can try meloxicam for your knee pain- once every 24 hours, don't take other NSAIDs like ibuprofen, alleve at same time.   Smoking Cessation Quitting smoking is important to your health and has many advantages. However, it is not always easy to quit since nicotine is a very addictive drug. Oftentimes, people try 3 times or more before being able to quit. This document explains the best ways for you to prepare to quit smoking. Quitting takes hard work and a lot of effort, but you can do it. ADVANTAGES OF QUITTING SMOKING  You will live longer, feel better, and live better.  Your body will feel the impact of quitting smoking almost immediately.  Within 20 minutes, blood pressure decreases. Your pulse returns to its normal level.  After 8 hours, carbon monoxide levels in the blood return to normal. Your oxygen level increases.  After 24 hours, the chance of having a heart attack starts to decrease. Your breath, hair, and body stop smelling like smoke.  After 48 hours, damaged nerve endings begin to recover. Your sense of taste and smell improve.  After 72 hours, the body is virtually free of nicotine. Your bronchial tubes relax and breathing becomes easier.  After 2 to 12 weeks, lungs can hold more air. Exercise becomes easier and circulation improves.  The risk of having a heart attack, stroke, cancer, or lung disease is greatly reduced.  After 1 year, the risk of coronary heart disease is cut in half.  After 5 years, the risk of stroke falls to the same as a nonsmoker.  After 10 years, the risk of lung cancer is cut in half and the risk of other cancers decreases significantly.  After 15 years, the risk of coronary heart disease drops, usually to the level of a nonsmoker.  If you are pregnant, quitting smoking will improve your chances of having a healthy baby.  The people you live with, especially any children, will be healthier.  You will have extra money to spend on things other  than cigarettes. QUESTIONS TO THINK ABOUT BEFORE ATTEMPTING TO QUIT You may want to talk about your answers with your health care provider.  Why do you want to quit?  If you tried to quit in the past, what helped and what did not?  What will be the most difficult situations for you after you quit? How will you plan to handle them?  Who can help you through the tough times? Your family? Friends? A health care provider?  What pleasures do you get from smoking? What ways can you still get pleasure if you quit? Here are some questions to ask your health care provider:  How can you help me to be successful at quitting?  What medicine do you think would be best for me and how should I take it?  What should I do if I need more help?  What is smoking withdrawal like? How can I get information on withdrawal? GET READY  Set a quit date.  Change your environment by getting rid of all cigarettes, ashtrays, matches, and lighters in your home, car, or work. Do not let people smoke in your home.  Review your past attempts to quit. Think about what worked and what did not. GET SUPPORT AND ENCOURAGEMENT You have a better chance of being successful if you have help. You can get support in many ways.  Tell your family, friends, and coworkers that you are going to quit and need their support. Ask them not  to smoke around you.  Get individual, group, or telephone counseling and support. Programs are available at local hospitals and health centers. Call your local health department for information about programs in your area.  Spiritual beliefs and practices may help some smokers quit.  Download a "quit meter" on your computer to keep track of quit statistics, such as how long you have gone without smoking, cigarettes not smoked, and money saved.  Get a self-help book about quitting smoking and staying off tobacco. LEARN NEW SKILLS AND BEHAVIORS  Distract yourself from urges to smoke. Talk to  someone, go for a walk, or occupy your time with a task.  Change your normal routine. Take a different route to work. Drink tea instead of coffee. Eat breakfast in a different place.  Reduce your stress. Take a hot bath, exercise, or read a book.  Plan something enjoyable to do every day. Reward yourself for not smoking.  Explore interactive web-based programs that specialize in helping you quit. GET MEDICINE AND USE IT CORRECTLY Medicines can help you stop smoking and decrease the urge to smoke. Combining medicine with the above behavioral methods and support can greatly increase your chances of successfully quitting smoking.  Nicotine replacement therapy helps deliver nicotine to your body without the negative effects and risks of smoking. Nicotine replacement therapy includes nicotine gum, lozenges, inhalers, nasal sprays, and skin patches. Some may be available over-the-counter and others require a prescription.  Antidepressant medicine helps people abstain from smoking, but how this works is unknown. This medicine is available by prescription.  Nicotinic receptor partial agonist medicine simulates the effect of nicotine in your brain. This medicine is available by prescription. Ask your health care provider for advice about which medicines to use and how to use them based on your health history. Your health care provider will tell you what side effects to look out for if you choose to be on a medicine or therapy. Carefully read the information on the package. Do not use any other product containing nicotine while using a nicotine replacement product.  RELAPSE OR DIFFICULT SITUATIONS Most relapses occur within the first 3 months after quitting. Do not be discouraged if you start smoking again. Remember, most people try several times before finally quitting. You may have symptoms of withdrawal because your body is used to nicotine. You may crave cigarettes, be irritable, feel very hungry, cough  often, get headaches, or have difficulty concentrating. The withdrawal symptoms are only temporary. They are strongest when you first quit, but they will go away within 10-14 days. To reduce the chances of relapse, try to:  Avoid drinking alcohol. Drinking lowers your chances of successfully quitting.  Reduce the amount of caffeine you consume. Once you quit smoking, the amount of caffeine in your body increases and can give you symptoms, such as a rapid heartbeat, sweating, and anxiety.  Avoid smokers because they can make you want to smoke.  Do not let weight gain distract you. Many smokers will gain weight when they quit, usually less than 10 pounds. Eat a healthy diet and stay active. You can always lose the weight gained after you quit.  Find ways to improve your mood other than smoking. FOR MORE INFORMATION  www.smokefree.gov  Document Released: 09/26/2001 Document Revised: 02/16/2014 Document Reviewed: 01/11/2012 [employer] Patient Information 2015 [employer]. This information is not intended to replace advice given to you by your health care provider. Make sure you discuss any questions you have with your health care   provider.  

## 2014-12-30 NOTE — Progress Notes (Signed)
Subjective:    Patient ID: Tanner Gomez, male    DOB: 17-Feb-1966, 49 y.o.   MRN: [id number]  HPI Patient presents today for CPE Last CPE- 3/15 Tdap- 2009 Flu-06/24/14 Dental- not for awhile Eye- not regular STI screening- in stable, monogamous relationship, does not desire screening at this time Exercise- uses step counter on his phone and usually gets > 7000 steps daily. Exercises his dog on the weekends and does a lot of yard work when weather is nice.  Tobacco- smokes 1 ppd for 30 years. Has tried to quit a couple of times. Nicotine patch gave him nightmares. Girlfriend also smokes. His workplace is going to institute a tobacco free policy soon.  Denies any side effects from medications.   Patient reports that he has gone through a lot the last couple of months. His brother died, his uncle died and his cousin's wife died. He is taking Xanax 1-2x a day. He reports that this keeps him from being so angry on the job site and when driving. Doesn't always take Xanax for sleep, and sleeps poorly if he doesn't. Sleeps about 6 hours a night. His girlfriend has been dealing with healthy issues- UC/diabetes.   Main complaint is left knee and lower leg pain where he had a crush injury (3-4 years ago) and surgery with titanium plates. Pain is in his knee as well as distal to the knee anterior. He notices it is worse with cold weather, rain, prolonged sitting. No falls, no weakness, rare numbness/tingling. Not currently bothering him. Little relief with ibuprofen 800 mg po BID.   Has gained 15 pounds in last 6 months. Attributes this to eating our for lunch instead of packing his lunch. Drinks 1 [employer] daily, no additional sweet tea, juice.   Review of Systems  Constitutional: Negative.   HENT: Negative.   Eyes: Negative.   Respiratory: Negative.   Cardiovascular: Negative.   Gastrointestinal: Negative.   Endocrine: Negative.   Genitourinary: Negative.   Musculoskeletal: Positive for  arthralgias.  Skin: Negative.   Allergic/Immunologic: Negative.   Neurological: Negative.   Hematological: Negative.   Psychiatric/Behavioral: Positive for sleep disturbance. The patient is nervous/anxious.       Objective:   Physical Exam  Constitutional: He is oriented to person, place, and time. He appears well-developed and well-nourished.  HENT:  Head: Normocephalic and atraumatic.  Right Ear: Tympanic membrane, external ear and ear canal normal.  Left Ear: Tympanic membrane, external ear and ear canal normal.  Nose: Nose normal.  Mouth/Throat: Oropharynx is clear and moist. No oropharyngeal exudate.  Eyes: Conjunctivae are normal. Pupils are equal, round, and reactive to light.  Neck: Normal range of motion. Neck supple. No JVD present.  Cardiovascular: Normal rate, regular rhythm and normal heart sounds.   Pulmonary/Chest: Effort normal and breath sounds normal.  Abdominal: Soft. Bowel sounds are normal. He exhibits no distension and no mass. There is no hepatosplenomegaly. There is no tenderness. There is no rebound, no guarding and no CVA tenderness.  Genitourinary: Rectum normal, prostate normal, testes normal and penis normal. Circumcised. No penile tenderness.  Musculoskeletal: Normal range of motion.       Left knee: He exhibits normal range of motion, no swelling and no effusion.  Left lower leg with well healed scars. Some crepitus with extension. No laxity.   Lymphadenopathy:    He has no cervical adenopathy.  Neurological: He is alert and oriented to person, place, and time.  Skin: Skin is warm  and dry.  Psychiatric: He has a normal mood and affect. His behavior is normal. Judgment and thought content normal.  Vitals reviewed.  BP 123/80 mmHg  Pulse 73  Temp(Src) 97.8 F (36.6 C) (Oral)  Resp 16  Ht 5\' 8"  (1.727 m)  Wt 210 lb 12.8 oz (95.618 kg)  BMI 32.06 kg/m2  SpO2 96%     Assessment & Plan:  1. Annual physical exam - Encouraged weight loss, regular  dental and eye exams  2. Essential hypertension - Comprehensive metabolic panel - atenolol (TENORMIN) 100 MG tablet; TAKE 1 TABLET BY MOUTH ONCE DAILY  Dispense: 90 tablet; Refill: 2 - amLODipine (NORVASC) 10 MG tablet; Take 1 tablet (10 mg total) by mouth daily.  Dispense: 90 tablet; Refill: 2  3. Current smoker - CBC - Discussed smoking cessation and encouraged patient to pick stop smoking date, provided written information for support  4. GAD (generalized anxiety disorder) - ALPRAZolam (XANAX) 1 MG tablet; TAKE 1 TABLET BY MOUTH 3 TIMES DAILY AS NEEDED FOR ANXIETY  Dispense: 90 tablet; Refill: 1 - discussed habit forming potential and encouraged patient to use sparingly. - I have provided 1 refill, he can call for additional refills   5. Gastroesophageal reflux disease without esophagitis - CBC - omeprazole (PRILOSEC) 40 MG capsule; Take 1 capsule (40 mg total) by mouth daily.  Dispense: 90 capsule; Refill: 2  6. Hyperlipidemia - Lipid panel - pravastatin (PRAVACHOL) 40 MG tablet; TAKE 1 TABLET BY MOUTH EVERY DAY  Dispense: 90 tablet; Refill: 2 - niacin (NIASPAN) 1000 MG CR tablet; TAKE 1 TABLET BY MOUTH EVERY DAY  Dispense: 90 tablet; Refill: 2  7. Screening for hematuria or proteinuria - POCT urinalysis dipstick  8. Screening for prostate cancer - PSA  9. Obesity - Hemoglobin A1c  10. Knee pain, chronic, left - meloxicam (MOBIC) 7.5 MG tablet; Take 1 tablet (7.5 mg total) by mouth daily.  Dispense: 30 tablet; Refill: 0  -follow up in 6 months  Emi Belfasteborah B. Gessner, FNP-BC  [facility], [facility]  12/31/2014 8:28 AM

## 2014-12-31 ENCOUNTER — Telehealth: Payer: Self-pay | Admitting: Family Medicine

## 2014-12-31 ENCOUNTER — Other Ambulatory Visit: Payer: Self-pay | Admitting: Family Medicine

## 2014-12-31 ENCOUNTER — Other Ambulatory Visit: Payer: Self-pay | Admitting: Internal Medicine

## 2014-12-31 ENCOUNTER — Encounter: Payer: Self-pay | Admitting: Family Medicine

## 2014-12-31 DIAGNOSIS — E781 Pure hyperglyceridemia: Secondary | ICD-10-CM

## 2014-12-31 LAB — HEMOGLOBIN A1C
Hgb A1c MFr Bld: 5.5 % (ref <5.7)
Mean Plasma Glucose: 111 mg/dL (ref <117)

## 2014-12-31 LAB — PSA: PSA: 0.16 ng/mL (ref <=4.00)

## 2014-12-31 MED ORDER — FENOFIBRATE 145 MG PO TABS: 145.0000 mg | ORAL_TABLET | Freq: Every day | ORAL | Status: DC

## 2014-12-31 NOTE — Telephone Encounter (Signed)
Called patient to review labs. He states he is taking pravastatin and niacin daily. Will add fenofibrate and recheck in 4-6 weeks. Will send him letter as well. Discussed risk of pancreatitis with his triglycerides being this elevated. Discussed need to lose weight and importance of decreasing sugars and starches.

## 2015-01-07 ENCOUNTER — Other Ambulatory Visit: Payer: Self-pay | Admitting: Internal Medicine

## 2015-01-11 ENCOUNTER — Encounter: Payer: Self-pay | Admitting: Family Medicine

## 2015-01-15 ENCOUNTER — Telehealth: Payer: Self-pay | Admitting: Family Medicine

## 2015-01-15 NOTE — Telephone Encounter (Signed)
Appt made

## 2015-01-21 ENCOUNTER — Other Ambulatory Visit: Payer: Self-pay | Admitting: Internal Medicine

## 2015-02-09 ENCOUNTER — Encounter: Payer: Self-pay | Admitting: Family Medicine

## 2015-02-09 ENCOUNTER — Ambulatory Visit (INDEPENDENT_AMBULATORY_CARE_PROVIDER_SITE_OTHER): Payer: BLUE CROSS/BLUE SHIELD | Admitting: Family Medicine

## 2015-02-09 VITALS — BP 133/86 | HR 75 | Temp 98.6°F | Resp 16 | Ht 67.75 in | Wt 211.0 lb

## 2015-02-09 DIAGNOSIS — E669 Obesity, unspecified: Secondary | ICD-10-CM | POA: Diagnosis not present

## 2015-02-09 DIAGNOSIS — E785 Hyperlipidemia, unspecified: Secondary | ICD-10-CM | POA: Diagnosis not present

## 2015-02-09 DIAGNOSIS — Z720 Tobacco use: Secondary | ICD-10-CM | POA: Diagnosis not present

## 2015-02-09 DIAGNOSIS — F17200 Nicotine dependence, unspecified, uncomplicated: Secondary | ICD-10-CM

## 2015-02-09 LAB — LIPID PANEL
CHOL/HDL RATIO: 36 ratio
CHOLESTEROL: 288 mg/dL — AB (ref 0–200)
HDL: 8 mg/dL — ABNORMAL LOW (ref >=40)
Triglycerides: 709 mg/dL — ABNORMAL HIGH (ref <150)

## 2015-02-09 NOTE — Patient Instructions (Signed)
Keep up the good work with your diet- I'm proud of you for making changes! Continue to work on your smoking, decreasing your cigarette use is a good start. Begin thinking about a stop smoking date!

## 2015-02-09 NOTE — Progress Notes (Signed)
   Subjective:    Patient ID: Tanner RyderLarry W Divito, male    DOB: 04-21-1966, 49 y.o.   MRN: [id number]  HPI Patient presents today for follow up of hypertriglyceridemia. He has stopped going to [employer] and is taking his lunch and eating more vegetables. No side effects from medication. He is working on cutting back on his cigarette smoking. He is meeting about his health screening next week and thinks that his company will be going tobacco free soon.   He has noticed he is more hungry since starting fenofibrate. Has had no weight loss with change in eating. Is not eating breakfast since he is avoiding fast food.   Review of Systems No chest pain, no SOB, no nausea or vomiting, had 3 brief episodes of stabbing pain in his right lower abdomen that lasted for a couple of minutes. No pain recently. No edema.    Objective:   Physical Exam  Constitutional: He is oriented to person, place, and time. He appears well-developed and well-nourished. No distress.  HENT:  Head: Normocephalic and atraumatic.  Eyes: Conjunctivae are normal.  Neck: Normal range of motion. Neck supple.  Cardiovascular: Normal rate, regular rhythm and normal heart sounds.   Pulmonary/Chest: Effort normal and breath sounds normal.  Musculoskeletal: Normal range of motion.  Neurological: He is alert and oriented to person, place, and time.  Skin: Skin is warm and dry. He is not diaphoretic.  Psychiatric: He has a normal mood and affect. His behavior is normal. Judgment and thought content normal.  Vitals reviewed.  BP 133/86 mmHg  Pulse 75  Temp(Src) 98.6 F (37 C) (Oral)  Resp 16  Ht 5' 7.75" (1.721 m)  Wt 211 lb (95.709 kg)  BMI 32.31 kg/m2  SpO2 97%    Assessment & Plan:  1. Hyperlipidemia - Lipid panel  2. Current smoker - Encouraged patient to consider smoking cessation and picking a date. He will be getting some counseling at his work place regarding smoking cessation.   3. Obesity - Discussed importance of  regular meals and encouraged him to keep  Working on healthy food choices.   - Will determine follow up based on lab results.    Olean Reeeborah Gessner, FNP-BC  [facility], [facility]  02/09/2015 8:36 AM

## 2015-02-11 ENCOUNTER — Other Ambulatory Visit: Payer: Self-pay | Admitting: Family Medicine

## 2015-02-11 DIAGNOSIS — E782 Mixed hyperlipidemia: Secondary | ICD-10-CM

## 2015-02-21 ENCOUNTER — Other Ambulatory Visit: Payer: Self-pay | Admitting: Family Medicine

## 2015-02-21 NOTE — Telephone Encounter (Signed)
Can we refill the mobic on this patient?

## 2015-03-10 ENCOUNTER — Other Ambulatory Visit: Payer: Self-pay | Admitting: Internal Medicine

## 2015-04-01 ENCOUNTER — Ambulatory Visit (INDEPENDENT_AMBULATORY_CARE_PROVIDER_SITE_OTHER): Payer: BLUE CROSS/BLUE SHIELD | Admitting: Cardiology

## 2015-04-01 ENCOUNTER — Encounter: Payer: Self-pay | Admitting: Cardiology

## 2015-04-01 ENCOUNTER — Other Ambulatory Visit: Payer: Self-pay | Admitting: Family Medicine

## 2015-04-01 VITALS — BP 120/80 | HR 69 | Ht 67.75 in | Wt 217.1 lb

## 2015-04-01 DIAGNOSIS — Z720 Tobacco use: Secondary | ICD-10-CM | POA: Diagnosis not present

## 2015-04-01 DIAGNOSIS — R0609 Other forms of dyspnea: Secondary | ICD-10-CM | POA: Diagnosis not present

## 2015-04-01 DIAGNOSIS — E785 Hyperlipidemia, unspecified: Secondary | ICD-10-CM

## 2015-04-01 DIAGNOSIS — K859 Acute pancreatitis without necrosis or infection, unspecified: Secondary | ICD-10-CM | POA: Insufficient documentation

## 2015-04-01 DIAGNOSIS — I10 Essential (primary) hypertension: Secondary | ICD-10-CM

## 2015-04-01 LAB — TSH: TSH: 2.29 u[IU]/mL (ref 0.35–4.50)

## 2015-04-01 NOTE — Patient Instructions (Signed)
Medication Instructions:  Your physician recommends that you continue on your current medications as directed. Please refer to the Current Medication list given to you today.   Labwork: TODAY: TSH  Testing/Procedures: Dr. Mayford Knife recommends you have an EXERCISE MYOVIEW.  Follow-Up: Your physician wants you to follow-up in: 1 year with Dr. Mayford Knife. You will receive a reminder letter in the mail two months in advance. If you don't receive a letter, please call our office to schedule the follow-up appointment.   Any Other Special Instructions Will Be Listed Below (If Applicable).

## 2015-04-01 NOTE — Progress Notes (Signed)
Cardiology Office Note   Date:  04/01/2015   ID:  Tanner Gomez, DOB [birth year], MRN [id number]  PCP:  Tonye Pearson, MD    Chief Complaint  Patient presents with  . New Evaluation    hypercholesterolemia with hypertriglyceridemia      History of Present Illness: Tanner Gomez is a 49 y.o. male who presents for evaluation of hyperlipidemia by his PCP.  His last lipids showed a total cholesterol of 288, TAGS of 709, HDL 8.  LDL could not be calculated due to severe hypertriglyceridemia.  He is currently on fenofibrate, Niaspan and pravastatin.  He says that when he goes up steps he will get DOE.  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  He denies any associated nausea or diaphoresis.    He denies any  LE edema, dizziness, palpitations or syncope.  He has had pancreatitis 3 times in the past 10 years.  He continues to smoke but has cut back to 1/2ppd.  He doesn't get much aerobic exercise due to working from 6am to 7pm.      Past Medical History  Diagnosis Date  . Hypertension   . Hyperlipidemia   . Pancreatitis   . Tobacco abuse     Past Surgical History  Procedure Laterality Date  . Eye surgery      RIGHT (CHILDHOOD)  . Joint replacement       Current Outpatient Prescriptions  Medication Sig Dispense Refill  . ALPRAZolam (XANAX) 1 MG tablet TAKE 1 TABLET BY MOUTH 3 TIMES DAILY AS NEEDED FOR ANXIETY 90 tablet 1  . amLODipine (NORVASC) 10 MG tablet Take 1 tablet (10 mg total) by mouth daily. 90 tablet 2  . atenolol (TENORMIN) 100 MG tablet TAKE 1 TABLET BY MOUTH ONCE DAILY 90 tablet 2  . fenofibrate (TRICOR) 145 MG tablet Take 1 tablet (145 mg total) by mouth daily. 90 tablet 1  . ibuprofen (ADVIL,MOTRIN) 800 MG tablet TAKE 1 TABLET BY MOUTH EVERY 8 HOURS AS NEEDED 270 tablet 0  . ketoconazole (NIZORAL) 2 % cream APPLY TOPICALLY DAILY. 30 g 2  .  meloxicam (MOBIC) 7.5 MG tablet TAKE 1 TABLET (7.5 MG TOTAL) BY MOUTH DAILY. 30 tablet 0  . niacin (NIASPAN) 1000 MG CR tablet TAKE 1 TABLET BY MOUTH EVERY DAY 90 tablet 2  . omeprazole (PRILOSEC) 40 MG capsule Take 1 capsule (40 mg total) by mouth daily. 90 capsule 2  . pravastatin (PRAVACHOL) 40 MG tablet TAKE 1 TABLET BY MOUTH EVERY DAY 90 tablet 2   No current facility-administered medications for this visit.    Allergies:   Sular    Social History:  The patient  reports that he has been smoking Cigarettes.  He has a 37 pack-year smoking history. He does not have any smokeless tobacco history on file. He reports that he drinks about 19.8 oz of alcohol per week.   Family History:  The patient's family history includes Diabetes in his father; Heart attack in his mother; Heart disease in his mother; Hypertension in his father and mother; Kidney Stones in his sister; Kidney disease in his mother.    ROS:  Please see the history of present illness.   Otherwise, review of systems are positive for none.   All other systems are  reviewed and negative.    PHYSICAL EXAM: VS:  BP 120/80 mmHg  Pulse 69  Ht 5' 7.75" (1.721 m)  Wt 217 lb 1.9 oz (98.485 kg)  BMI 33.25 kg/m2 , BMI Body mass index is 33.25 kg/(m^2). GEN: Well nourished, well developed, in no acute distress HEENT: normal Neck: no JVD, carotid bruits, or masses Cardiac: RRR; no murmurs, rubs, or gallops,no edema  Respiratory:  clear to auscultation bilaterally, normal work of breathing GI: soft, nontender, nondistended, + BS MS: no deformity or atrophy Skin: warm and dry, no rash Neuro:  Strength and sensation are intact Psych: euthymic mood, full affect   EKG:  EKG is ordered today. The ekg ordered today demonstrates NSR with no ST changes   Recent Labs: 12/30/2014: ALT 31; BUN 11; Creat 0.85; Hemoglobin 14.5; Platelets 255; Potassium 3.7; Sodium 134*    Lipid Panel    Component Value Date/Time   CHOL 288* 02/09/2015  0828   TRIG 709* 02/09/2015 0828   HDL 8* 02/09/2015 0828   CHOLHDL 36.0 02/09/2015 0828   VLDL NOT CALC 02/09/2015 0828   LDLCALC NOT CALC 02/09/2015 0828      Wt Readings from Last 3 Encounters:  04/01/15 217 lb 1.9 oz (98.485 kg)  02/09/15 211 lb (95.709 kg)  12/30/14 210 lb 12.8 oz (95.618 kg)        ASSESSMENT AND PLAN:  1.  Hyperlipidemia with severe hypertriglyceridemia.  We discussed the complications of elevated triglycerides to the degree that his are, including, pancreatitis and heart disease.  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  He is already on a fibrate, statin and niacin.  I am going to refer him to a nutritionist.  I have encouraged him to get into a routine exercise program.  I will check a TSH.  His last HbA1C was 5.5.  I have encouraged him to quit smoking as this will help his very low HDL.  I am going to refer him to lipid clinic for further recommendations on medical therapy.  I have encouraged him to cut back on his alcohol intake. 2.  HTN 3.  Ongoing tobacco use - I have encouraged him to quit 4.  DOE with going up stairs.  He has several CRF including family history of CAD, hyperlipidemia, ongoing tobacco use and HTN.     Current medicines are reviewed at length with the patient today.  The patient does not have concerns regarding medicines.  The following changes have been made:  no change  Labs/ tests ordered today: See above Assessment and Plan No orders of the defined types were placed in this encounter.     Disposition:   FU with me in 1 year  Signed, Quintella Reichert, MD  04/01/2015 2:06 PM    [facility] [address] [place], [place], [place]  [address] Phone: [phone]; Fax: [phone]

## 2015-04-13 ENCOUNTER — Telehealth (HOSPITAL_COMMUNITY): Payer: Self-pay

## 2015-04-13 NOTE — Telephone Encounter (Signed)
Patient given detailed instructions per Myocardial Perfusion Study Information Sheet for test on 04-15-2015 at 9:15am. Patient Notified to arrive 15 minutes early, and that it is imperative to arrive on time for appointment to keep from having the test rescheduled. Patient verbalized understanding. Tanner EvensEdwards, Tanner Gomez

## 2015-04-15 ENCOUNTER — Ambulatory Visit (INDEPENDENT_AMBULATORY_CARE_PROVIDER_SITE_OTHER): Payer: BLUE CROSS/BLUE SHIELD | Admitting: Pharmacist

## 2015-04-15 ENCOUNTER — Ambulatory Visit (HOSPITAL_COMMUNITY): Payer: BLUE CROSS/BLUE SHIELD | Attending: Cardiology

## 2015-04-15 DIAGNOSIS — E785 Hyperlipidemia, unspecified: Secondary | ICD-10-CM | POA: Diagnosis not present

## 2015-04-15 DIAGNOSIS — R0609 Other forms of dyspnea: Secondary | ICD-10-CM | POA: Insufficient documentation

## 2015-04-15 LAB — LDL CHOLESTEROL, DIRECT: Direct LDL: 140 mg/dL

## 2015-04-15 LAB — MYOCARDIAL PERFUSION IMAGING
CHL CUP NUCLEAR SSS: 6
CHL RATE OF PERCEIVED EXERTION: 20
CSEPED: 10 min
CSEPEW: 11.4 METS
CSEPPHR: 153 {beats}/min
Exercise duration (sec): 15 s
LV dias vol: 130 mL
LV sys vol: 47 mL
MPHR: 171 {beats}/min
NUC STRESS TID: 0.87
Percent HR: 89 %
RATE: 0.33
Rest HR: 65 {beats}/min
SDS: 3
SRS: 3

## 2015-04-15 LAB — LIPID PANEL
Cholesterol: 228 mg/dL — ABNORMAL HIGH (ref 0–200)
HDL: 20.8 mg/dL — ABNORMAL LOW (ref >39.00)
Total CHOL/HDL Ratio: 11
Triglycerides: 476 mg/dL — ABNORMAL HIGH (ref 0.0–149.0)

## 2015-04-15 LAB — HEPATIC FUNCTION PANEL
ALT: 22 U/L (ref 0–53)
AST: 25 U/L (ref 0–37)
Albumin: 4.6 g/dL (ref 3.5–5.2)
Alkaline Phosphatase: 31 U/L — ABNORMAL LOW (ref 39–117)
BILIRUBIN DIRECT: 0.1 mg/dL (ref 0.0–0.3)
Total Bilirubin: 0.4 mg/dL (ref 0.2–1.2)
Total Protein: 7.9 g/dL (ref 6.0–8.3)

## 2015-04-15 MED ORDER — TECHNETIUM TC 99M SESTAMIBI GENERIC - CARDIOLITE
30.0000 | Freq: Once | INTRAVENOUS | Status: AC | PRN
  Administered 2015-04-15: 30 via INTRAVENOUS

## 2015-04-15 MED ORDER — TECHNETIUM TC 99M SESTAMIBI GENERIC - CARDIOLITE
10.6000 | Freq: Once | INTRAVENOUS | Status: AC | PRN
  Administered 2015-04-15: 11 via INTRAVENOUS

## 2015-04-15 NOTE — Progress Notes (Signed)
Chief Complaint  Patient presents with  . Hyperlipidemia      History of Present Illness: Tanner Gomez is a 49 y.o. male patient of Dr. Mayford Knifeurner who was referred to the lipid clinic due to a history of hypertriglyceridemia.     His last lipids showed a total cholesterol of 288, TAGS of 709, HDL 8.  LDL could not be calculated due to severe hypertriglyceridemia.  He is currently on fenofibrate, Niaspan and pravastatin.  He states he is compliant and takes his medications before breakfast each morning.  He has had pancreatitis 3 times in the past 10 years.  He states his TG have been has high as 1200 in the past but after looking through his available labs, this was as high as it has been in the past several years.  His most recent A1c was 5.5. He continues to smoke but has cut back to 1/2ppd.  He doesn't get much aerobic exercise due to working from 6am to 7pm.  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     Pt is trying to improve his diet.  He has lean Malaw[place]urkey sandwich on wheat bread or a PB and banana sandwich with honey.  During the week he will have a Lean Cuisine type meal for dinner.  When he cooks on the weekend, it may be baked or BBQ chicken, sweet potatoes, green beans, or corn.  He drinks water, 1 soft drink per day and ~ 3 beers per day.     Labs:  03/2015- TC 228, TG 476, HDL 21, LDL 140 (Niacin 1000mg  daily, fenofibrate 145mg  daily and pravastatin 40mg  daily) 01/2015- TC 288, TG 709, HDL 8, LDL not calcuated (niacin 1000mg  daily, fenofibrate 145mg , and pravastatin 40mg ) 12/2014- TC 230, TG 826, HDL 24, LDL not calculated  Past Medical History  Diagnosis Date  . Hypertension   . Hyperlipidemia   . Pancreatitis   . Tobacco abuse     Current Outpatient Prescriptions  Medication Sig Dispense Refill  . ALPRAZolam (XANAX) 1 MG tablet TAKE 1 TABLET BY MOUTH 3 TIMES DAILY AS NEEDED FOR ANXIETY 90 tablet 1  . amLODipine  (NORVASC) 10 MG tablet Take 1 tablet (10 mg total) by mouth daily. 90 tablet 2  . atenolol (TENORMIN) 100 MG tablet TAKE 1 TABLET BY MOUTH ONCE DAILY 90 tablet 2  . fenofibrate (TRICOR) 145 MG tablet Take 1 tablet (145 mg total) by mouth daily. 90 tablet 1  . ibuprofen (ADVIL,MOTRIN) 800 MG tablet TAKE 1 TABLET BY MOUTH EVERY 8 HOURS AS NEEDED 270 tablet 0  . meloxicam (MOBIC) 7.5 MG tablet TAKE 1 TABLET (7.5 MG TOTAL) BY MOUTH DAILY. 30 tablet 0  . niacin (NIASPAN) 1000 MG CR tablet TAKE 1 TABLET BY MOUTH EVERY DAY 90 tablet 2  . omeprazole (PRILOSEC) 40 MG capsule Take 1 capsule (40 mg total) by mouth daily. 90 capsule 2  . pravastatin (PRAVACHOL) 40 MG tablet TAKE 1 TABLET BY MOUTH EVERY DAY 90 tablet 2  . ketoconazole (NIZORAL) 2 % cream APPLY TOPICALLY DAILY. 30 g 2   No current facility-administered medications for this visit.    Allergies:   Sular    Social History:  The patient  reports that he has been smoking Cigarettes.  He has a 37 pack-year smoking history. He does not have any smokeless tobacco  history on file. He reports that he drinks about 19.8 oz of alcohol per week.   Family History:  The patient's family history includes Diabetes in his father; Heart attack in his mother; Heart disease in his mother; Hypertension in his father and mother; Kidney Stones in his sister; Kidney disease in his mother.       ASSESSMENT AND PLAN:  1.  Hyperlipidemia with severe hypertriglyceridemia-  Pt had labs redrawn today.  TG have improved greatly after 3 months of therapy.  Still not at goal of <150.  Non-HDL 200 (goal <160).  LFTS normal. Will increase Niaspan to  daily and recheck labs in 3 months.  If TG still elevated, may consider addition of fish oil or changing to a higher potency statin.   Edrick Oh [facility]  04/15/2015 3:41 PM    [facility] [address] [place], [place], [place]  [address] Phone: [phone]; Fax: [phone]

## 2015-04-21 ENCOUNTER — Other Ambulatory Visit: Payer: Self-pay | Admitting: Physician Assistant

## 2015-04-21 MED ORDER — NIACIN ER (ANTIHYPERLIPIDEMIC) 1000 MG PO TBCR: EXTENDED_RELEASE_TABLET | ORAL | Status: DC

## 2015-05-07 ENCOUNTER — Other Ambulatory Visit: Payer: Self-pay | Admitting: Family Medicine

## 2015-05-15 ENCOUNTER — Other Ambulatory Visit: Payer: Self-pay | Admitting: Family Medicine

## 2015-05-17 NOTE — Telephone Encounter (Signed)
[name], you have Rxd this for pt several times, but have never put RFs on it. Do you want to RF?

## 2015-05-20 ENCOUNTER — Other Ambulatory Visit: Payer: Self-pay | Admitting: Family Medicine

## 2015-06-10 ENCOUNTER — Telehealth: Payer: Self-pay | Admitting: Physician Assistant

## 2015-06-11 ENCOUNTER — Ambulatory Visit (INDEPENDENT_AMBULATORY_CARE_PROVIDER_SITE_OTHER): Payer: BLUE CROSS/BLUE SHIELD | Admitting: Physician Assistant

## 2015-06-11 ENCOUNTER — Ambulatory Visit (INDEPENDENT_AMBULATORY_CARE_PROVIDER_SITE_OTHER): Payer: BLUE CROSS/BLUE SHIELD

## 2015-06-11 VITALS — BP 128/82 | HR 75 | Temp 98.1°F | Resp 16 | Ht 67.75 in | Wt 221.4 lb

## 2015-06-11 DIAGNOSIS — J209 Acute bronchitis, unspecified: Secondary | ICD-10-CM | POA: Diagnosis not present

## 2015-06-11 DIAGNOSIS — R05 Cough: Secondary | ICD-10-CM | POA: Diagnosis not present

## 2015-06-11 DIAGNOSIS — R058 Other specified cough: Secondary | ICD-10-CM

## 2015-06-11 DIAGNOSIS — R062 Wheezing: Secondary | ICD-10-CM

## 2015-06-11 MED ORDER — HYDROCOD POLST-CPM POLST ER 10-8 MG/5ML PO SUER: 5.0000 mL | Freq: Every evening | ORAL | Status: AC | PRN

## 2015-06-11 MED ORDER — ALBUTEROL SULFATE HFA 108 (90 BASE) MCG/ACT IN AERS: 2.0000 | INHALATION_SPRAY | RESPIRATORY_TRACT | Status: DC | PRN

## 2015-06-11 MED ORDER — AZITHROMYCIN 250 MG PO TABS: ORAL_TABLET | ORAL | Status: DC

## 2015-06-11 MED ORDER — HYDROCOD POLST-CPM POLST ER 10-8 MG/5ML PO SUER: 5.0000 mL | Freq: Every evening | ORAL | Status: DC | PRN

## 2015-06-11 NOTE — Progress Notes (Signed)
[facility] [address], [place] [address] [phone]  Date:  06/11/2015   Name:  Tanner Gomez   DOB:  1965-12-14   MRN:  [id number]  PCP:  Tonye Pearson, MD    History of Present Illness:  Tanner Gomez is a 49 y.o. male patient who presents to [facility] for chief complaint of productive cough for the last 2 weeks.  Patient states that he has severe cough of productive green mucus.  He has pain at his ribs due to the coughing.  It worsens at night.  He has no hx of fever.  He denies sob and dyspnea constantly, but has this with his coughing.  He has no nasal congestion, sorethroat, or ear discomfort.    He is a smoker, who has scaled down his tobacco use to 1/3 of a pack per day.    Patient Active Problem List   Diagnosis Date Noted  . Pancreatitis   . Tobacco abuse   . GAD (generalized anxiety disorder) 07/16/2012  . HTN (hypertension) 11/09/2011  . Hyperlipidemia 11/09/2011  . GERD (gastroesophageal reflux disease) 11/09/2011  . LFT elevation 11/09/2011  . Depression 11/09/2011  . Gout 11/09/2011  . Acute pancreatitis 11/09/2011    Past Medical History  Diagnosis Date  . Hypertension   . Hyperlipidemia   . Pancreatitis   . Tobacco abuse     Past Surgical History  Procedure Laterality Date  . Eye surgery      RIGHT (CHILDHOOD)  . Joint replacement      Social History  Substance Use Topics  . Smoking status: Current Every Day Smoker -- 1.00 packs/day for 37 years    Types: Cigarettes  . Smokeless tobacco: None  . Alcohol Use: 19.8 oz/week    12 Standard drinks or equivalent, 21 Cans of beer per week    Family History  Problem Relation Age of Onset  . Kidney disease Mother   . Hypertension Mother   . Heart disease Mother   . Heart attack Mother   . Diabetes Father   . Hypertension Father   . Kidney Stones Sister     Allergies  Allergen Reactions  . Sular Swelling    Medication list has been reviewed and  updated.  Current Outpatient Prescriptions on File Prior to Visit  Medication Sig Dispense Refill  . ALPRAZolam (XANAX) 1 MG tablet TAKE 1 TABLET BY MOUTH 3 TIMES A DAY AS NEEDED FOR ANXIETY 90 tablet 1  . amLODipine (NORVASC) 10 MG tablet Take 1 tablet (10 mg total) by mouth daily. 90 tablet 2  . atenolol (TENORMIN) 100 MG tablet TAKE 1 TABLET BY MOUTH ONCE DAILY 90 tablet 2  . fenofibrate (TRICOR) 145 MG tablet Take 1 tablet (145 mg total) by mouth daily. 90 tablet 1  . ibuprofen (ADVIL,MOTRIN) 800 MG tablet TAKE 1 TABLET BY MOUTH EVERY 8 HOURS AS NEEDED 270 tablet 0  . ketoconazole (NIZORAL) 2 % cream APPLY TOPICALLY DAILY 30 g 0  . meloxicam (MOBIC) 7.5 MG tablet TAKE 1 TABLET (7.5 MG TOTAL) BY MOUTH DAILY. 30 tablet 0  . niacin (NIASPAN) 1000 MG CR tablet TAKE 2 TABLETS BY MOUTH EVERY DAY 180 tablet 2  . omeprazole (PRILOSEC) 40 MG capsule Take 1 capsule (40 mg total) by mouth daily. 90 capsule 2  . pravastatin (PRAVACHOL) 40 MG tablet TAKE 1 TABLET BY MOUTH EVERY DAY 90 tablet 2   No current facility-administered medications on file prior  to visit.    ROS ROS otherwise unremarkable unless listed above.   Physical Examination: BP 128/82 mmHg  Pulse 75  Temp(Src) 98.1 F (36.7 C) (Oral)  Resp 16  Ht 5' 7.75" (1.721 m)  Wt 221 lb 6.4 oz (100.426 kg)  BMI 33.91 kg/m2  SpO2 97% Ideal Body Weight: Weight in (lb) to have BMI = 25: 162.9  Physical Exam  Constitutional: He appears well-developed and well-nourished. No distress.  HENT:  Head: Normocephalic and atraumatic.  Right Ear: Tympanic membrane, external ear and ear canal normal.  Left Ear: Tympanic membrane, external ear and ear canal normal.  Nose: No mucosal edema or rhinorrhea.  Mouth/Throat: No oropharyngeal exudate, posterior oropharyngeal edema or posterior oropharyngeal erythema.  Eyes: Pupils are equal, round, and reactive to light.  Neck: Normal range of motion.  Cardiovascular: Normal rate, regular rhythm,  normal heart sounds and intact distal pulses.  Exam reveals no friction rub.   No murmur heard. Pulmonary/Chest: No apnea. No respiratory distress. He has no decreased breath sounds. He has wheezes (expiratory wheezing more prominent at lower lung bases. ). He has rhonchi.  Lymphadenopathy:       Head (right side): No submental, no submandibular and no tonsillar adenopathy present.       Head (left side): No submental, no submandibular and no tonsillar adenopathy present.    He has no cervical adenopathy.  Skin: He is not diaphoretic.  Psychiatric: He has a normal mood and affect. His behavior is normal.    [facility] reading (PRIMARY) by  Dr. Patsy Lager: XR consistent with bronchitis, but no evidence of infiltrate.  Assessment and Plan: 49 year old male is here today for chief complaint of productive cough for 2 weeks.  Treating with abx for bronchitis today.   have also issued albuterol to assist with his breathing.   Tussionex for cough.     Encouraged smoking cessation, and advised of hotline, as this likely will be the root of his compromise to uri to come.  Acute bronchitis, unspecified organism - Plan: azithromycin (ZITHROMAX) 250 MG tablet  Productive cough - Plan: DG Chest 2 View, albuterol (PROVENTIL HFA;VENTOLIN HFA) 108 (90 BASE) MCG/ACT inhaler, azithromycin (ZITHROMAX) 250 MG tablet, chlorpheniramine-HYDROcodone (TUSSIONEX PENNKINETIC ER) 10-8 MG/5ML SUER, DISCONTINUED: chlorpheniramine-HYDROcodone (TUSSIONEX PENNKINETIC ER) 10-8 MG/5ML SUER  Wheezing - Plan: DG Chest 2 View, albuterol (PROVENTIL HFA;VENTOLIN HFA) 108 (90 BASE) MCG/ACT inhaler, azithromycin (ZITHROMAX) 250 MG tablet   Trena Platt, PA-C [facility] 06/11/2015 8:32 AM

## 2015-06-11 NOTE — Patient Instructions (Addendum)
Please hydrate well. Try to lay off the smoking, and I have included information on smoking cessation.    Acute Bronchitis Bronchitis is inflammation of the airways that extend from the windpipe into the lungs (bronchi). The inflammation often causes mucus to develop. This leads to a cough, which is the most common symptom of bronchitis.  In acute bronchitis, the condition usually develops suddenly and goes away over time, usually in a couple weeks. Smoking, allergies, and asthma can make bronchitis worse. Repeated episodes of bronchitis may cause further lung problems.  CAUSES Acute bronchitis is most often caused by the same virus that causes a cold. The virus can spread from person to person (contagious) through coughing, sneezing, and touching contaminated objects. SIGNS AND SYMPTOMS   Cough.   Fever.   Coughing up mucus.   Body aches.   Chest congestion.   Chills.   Shortness of breath.   Sore throat.  DIAGNOSIS  Acute bronchitis is usually diagnosed through a physical exam. Your health care provider will also ask you questions about your medical history. Tests, such as chest X-rays, are sometimes done to rule out other conditions.  TREATMENT  Acute bronchitis usually goes away in a couple weeks. Oftentimes, no medical treatment is necessary. Medicines are sometimes given for relief of fever or cough. Antibiotic medicines are usually not needed but may be prescribed in certain situations. In some cases, an inhaler may be recommended to help reduce shortness of breath and control the cough. A cool mist vaporizer may also be used to help thin bronchial secretions and make it easier to clear the chest.  HOME CARE INSTRUCTIONS  Get plenty of rest.   Drink enough fluids to keep your urine clear or pale yellow (unless you have a medical condition that requires fluid restriction). Increasing fluids may help thin your respiratory secretions (sputum) and reduce chest congestion,  and it will prevent dehydration.   Take medicines only as directed by your health care provider.  If you were prescribed an antibiotic medicine, finish it all even if you start to feel better.  Avoid smoking and secondhand smoke. Exposure to cigarette smoke or irritating chemicals will make bronchitis worse. If you are a smoker, consider using nicotine gum or skin patches to help control withdrawal symptoms. Quitting smoking will help your lungs heal faster.   Reduce the chances of another bout of acute bronchitis by washing your hands frequently, avoiding people with cold symptoms, and trying not to touch your hands to your mouth, nose, or eyes.   Keep all follow-up visits as directed by your health care provider.  SEEK MEDICAL CARE IF: Your symptoms do not improve after 1 week of treatment.  SEEK IMMEDIATE MEDICAL CARE IF:  You develop an increased fever or chills.   You have chest pain.   You have severe shortness of breath.  You have bloody sputum.   You develop dehydration.  You faint or repeatedly feel like you are going to pass out.  You develop repeated vomiting.  You develop a severe headache. MAKE SURE YOU:   Understand these instructions.  Will watch your condition.  Will get help right away if you are not doing well or get worse. Document Released: 11/09/2004 Document Revised: 02/16/2014 Document Reviewed: 03/25/2013 [facility] Patient Information 2015 [place], [place]. This information is not intended to replace advice given to you by your health care provider. Make sure you discuss any questions you have with your health care provider.  Smoking Cessation Quitting  smoking is important to your health and has many advantages. However, it is not always easy to quit since nicotine is a very addictive drug. Oftentimes, people try 3 times or more before being able to quit. This document explains the best ways for you to prepare to quit smoking. Quitting takes hard  work and a lot of effort, but you can do it. ADVANTAGES OF QUITTING SMOKING  You will live longer, feel better, and live better.  Your body will feel the impact of quitting smoking almost immediately.  Within 20 minutes, blood pressure decreases. Your pulse returns to its normal level.  After 8 hours, carbon monoxide levels in the blood return to normal. Your oxygen level increases.  After 24 hours, the chance of having a heart attack starts to decrease. Your breath, hair, and body stop smelling like smoke.  After 48 hours, damaged nerve endings begin to recover. Your sense of taste and smell improve.  After 72 hours, the body is virtually free of nicotine. Your bronchial tubes relax and breathing becomes easier.  After 2 to 12 weeks, lungs can hold more air. Exercise becomes easier and circulation improves.  The risk of having a heart attack, stroke, cancer, or lung disease is greatly reduced.  After 1 year, the risk of coronary heart disease is cut in half.  After 5 years, the risk of stroke falls to the same as a nonsmoker.  After 10 years, the risk of lung cancer is cut in half and the risk of other cancers decreases significantly.  After 15 years, the risk of coronary heart disease drops, usually to the level of a nonsmoker.  If you are pregnant, quitting smoking will improve your chances of having a healthy baby.  The people you live with, especially any children, will be healthier.  You will have extra money to spend on things other than cigarettes. QUESTIONS TO THINK ABOUT BEFORE ATTEMPTING TO QUIT You may want to talk about your answers with your health care provider.  Why do you want to quit?  If you tried to quit in the past, what helped and what did not?  What will be the most difficult situations for you after you quit? How will you plan to handle them?  Who can help you through the tough times? Your family? Friends? A health care provider?  What pleasures do  you get from smoking? What ways can you still get pleasure if you quit? Here are some questions to ask your health care provider:  How can you help me to be successful at quitting?  What medicine do you think would be best for me and how should I take it?  What should I do if I need more help?  What is smoking withdrawal like? How can I get information on withdrawal? GET READY  Set a quit date.  Change your environment by getting rid of all cigarettes, ashtrays, matches, and lighters in your home, car, or work. Do not let people smoke in your home.  Review your past attempts to quit. Think about what worked and what did not. GET SUPPORT AND ENCOURAGEMENT You have a better chance of being successful if you have help. You can get support in many ways.  Tell your family, friends, and coworkers that you are going to quit and need their support. Ask them not to smoke around you.  Get individual, group, or telephone counseling and support. Programs are available at [employer] and health centers. Call your local  health department for information about programs in your area.  Spiritual beliefs and practices may help some smokers quit.  Download a "quit meter" on your computer to keep track of quit statistics, such as how long you have gone without smoking, cigarettes not smoked, and money saved.  Get a self-help book about quitting smoking and staying off tobacco. LEARN NEW SKILLS AND BEHAVIORS  Distract yourself from urges to smoke. Talk to someone, go for a walk, or occupy your time with a task.  Change your normal routine. Take a different route to work. Drink tea instead of coffee. Eat breakfast in a different place.  Reduce your stress. Take a hot bath, exercise, or read a book.  Plan something enjoyable to do every day. Reward yourself for not smoking.  Explore interactive web-based programs that specialize in helping you quit. GET MEDICINE AND USE IT CORRECTLY Medicines  can help you stop smoking and decrease the urge to smoke. Combining medicine with the above behavioral methods and support can greatly increase your chances of successfully quitting smoking.  Nicotine replacement therapy helps deliver nicotine to your body without the negative effects and risks of smoking. Nicotine replacement therapy includes nicotine gum, lozenges, inhalers, nasal sprays, and skin patches. Some may be available over-the-counter and others require a prescription.  Antidepressant medicine helps people abstain from smoking, but how this works is unknown. This medicine is available by prescription.  Nicotinic receptor partial agonist medicine simulates the effect of nicotine in your brain. This medicine is available by prescription. Ask your health care provider for advice about which medicines to use and how to use them based on your health history. Your health care provider will tell you what side effects to look out for if you choose to be on a medicine or therapy. Carefully read the information on the package. Do not use any other product containing nicotine while using a nicotine replacement product.  RELAPSE OR DIFFICULT SITUATIONS Most relapses occur within the first 3 months after quitting. Do not be discouraged if you start smoking again. Remember, most people try several times before finally quitting. You may have symptoms of withdrawal because your body is used to nicotine. You may crave cigarettes, be irritable, feel very hungry, cough often, get headaches, or have difficulty concentrating. The withdrawal symptoms are only temporary. They are strongest when you first quit, but they will go away within 10-14 days. To reduce the chances of relapse, try to:  Avoid drinking alcohol. Drinking lowers your chances of successfully quitting.  Reduce the amount of caffeine you consume. Once you quit smoking, the amount of caffeine in your body increases and can give you symptoms, such  as a rapid heartbeat, sweating, and anxiety.  Avoid smokers because they can make you want to smoke.  Do not let weight gain distract you. Many smokers will gain weight when they quit, usually less than 10 pounds. Eat a healthy diet and stay active. You can always lose the weight gained after you quit.  Find ways to improve your mood other than smoking. FOR MORE INFORMATION  www.smokefree.gov  Document Released: 09/26/2001 Document Revised: 02/16/2014 Document Reviewed: 01/11/2012 [facility] Patient Information 2015 [place], [place]. This information is not intended to replace advice given to you by your health care provider. Make sure you discuss any questions you have with your health care provider.

## 2015-06-11 NOTE — Telephone Encounter (Signed)
[name], I'm sending this req to you since you were last to see pt for physical/check up. This med has been Rxd for pt since 2013, this Dx is copied from 12/24/13 OV plan: Balanitis -intermittent/recurrent/responsive to treatment. Do you want to give RFs?

## 2015-06-12 NOTE — Telephone Encounter (Signed)
Left detailed message on voicemail.  

## 2015-06-12 NOTE — Telephone Encounter (Signed)
Please let him know that I will approve this, but he needs to come in and be seen if not better in 3-4 days, sooner if he has worsening symptoms.

## 2015-06-27 ENCOUNTER — Other Ambulatory Visit: Payer: Self-pay | Admitting: Family Medicine

## 2015-06-30 ENCOUNTER — Telehealth: Payer: Self-pay

## 2015-06-30 ENCOUNTER — Ambulatory Visit (INDEPENDENT_AMBULATORY_CARE_PROVIDER_SITE_OTHER): Payer: BLUE CROSS/BLUE SHIELD | Admitting: Internal Medicine

## 2015-06-30 ENCOUNTER — Encounter: Payer: Self-pay | Admitting: Internal Medicine

## 2015-06-30 VITALS — BP 119/80 | HR 70 | Temp 98.5°F | Resp 16 | Ht 67.75 in | Wt 221.2 lb

## 2015-06-30 DIAGNOSIS — Z114 Encounter for screening for human immunodeficiency virus [HIV]: Secondary | ICD-10-CM | POA: Diagnosis not present

## 2015-06-30 DIAGNOSIS — I10 Essential (primary) hypertension: Secondary | ICD-10-CM

## 2015-06-30 DIAGNOSIS — K219 Gastro-esophageal reflux disease without esophagitis: Secondary | ICD-10-CM

## 2015-06-30 DIAGNOSIS — E785 Hyperlipidemia, unspecified: Secondary | ICD-10-CM | POA: Diagnosis not present

## 2015-06-30 DIAGNOSIS — Z23 Encounter for immunization: Secondary | ICD-10-CM

## 2015-06-30 LAB — COMPREHENSIVE METABOLIC PANEL
ALK PHOS: 31 U/L — AB (ref 40–115)
ALT: 24 U/L (ref 9–46)
AST: 29 U/L (ref 10–40)
Albumin: 4.6 g/dL (ref 3.6–5.1)
BUN: 16 mg/dL (ref 7–25)
CALCIUM: 9.8 mg/dL (ref 8.6–10.3)
CHLORIDE: 100 mmol/L (ref 98–110)
CO2: 27 mmol/L (ref 20–31)
Creat: 1.1 mg/dL (ref 0.60–1.35)
GLUCOSE: 88 mg/dL (ref 65–99)
POTASSIUM: 3.9 mmol/L (ref 3.5–5.3)
Sodium: 135 mmol/L (ref 135–146)
Total Bilirubin: 0.5 mg/dL (ref 0.2–1.2)
Total Protein: 7.8 g/dL (ref 6.1–8.1)

## 2015-06-30 LAB — LIPID PANEL
CHOL/HDL RATIO: 31.9 ratio — AB (ref <=5.0)
CHOLESTEROL: 255 mg/dL — AB (ref 125–200)
HDL: 8 mg/dL — AB (ref >=40)
TRIGLYCERIDES: 692 mg/dL — AB (ref <150)

## 2015-06-30 LAB — HIV ANTIBODY (ROUTINE TESTING W REFLEX): HIV: NONREACTIVE

## 2015-06-30 MED ORDER — MELOXICAM 7.5 MG PO TABS: ORAL_TABLET | ORAL | Status: DC

## 2015-06-30 MED ORDER — PRAVASTATIN SODIUM 40 MG PO TABS: ORAL_TABLET | ORAL | Status: DC

## 2015-06-30 MED ORDER — OMEPRAZOLE 40 MG PO CPDR: 40.0000 mg | DELAYED_RELEASE_CAPSULE | Freq: Every day | ORAL | Status: DC

## 2015-06-30 MED ORDER — AMLODIPINE BESYLATE 10 MG PO TABS: 10.0000 mg | ORAL_TABLET | Freq: Every day | ORAL | Status: DC

## 2015-06-30 MED ORDER — FENOFIBRATE 145 MG PO TABS: ORAL_TABLET | ORAL | Status: DC

## 2015-06-30 MED ORDER — BECLOMETHASONE DIPROPIONATE 80 MCG/ACT IN AERS: 1.0000 | INHALATION_SPRAY | Freq: Two times a day (BID) | RESPIRATORY_TRACT | Status: DC

## 2015-06-30 MED ORDER — ALPRAZOLAM 1 MG PO TABS: ORAL_TABLET | ORAL | Status: DC

## 2015-06-30 MED ORDER — ATENOLOL 100 MG PO TABS: ORAL_TABLET | ORAL | Status: DC

## 2015-06-30 NOTE — Telephone Encounter (Signed)
Pharm faxed note asking for change of fenofibrate from 145 mg to 105 mg QD due to cost benefit for pt (145 mg would cost pt $90). Ok to change? I can't find the 105 mg in EPIC but can call in to OK change.

## 2015-06-30 NOTE — Telephone Encounter (Signed)
i can't see a 105 dose--?120mg  would be ok to change no matter

## 2015-06-30 NOTE — Progress Notes (Signed)
Subjective:    Patient ID: Tanner Gomez, male    DOB: [birth year], 49 y.o.   MRN: [id number]  HPIf/u Patient Active Problem List   Diagnosis Date Noted  . Pancreatitis--no problems since last visit    . Tobacco abuse--continues to be unable to quit /recognizes this is a hazard but is reassured by his recent cardiovascular exam    . 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 HTN (hypertension)--stable on meds  11/09/2011  . Hyperlipidemia--see recent office visits regarding hypertriglyceridemia and response to treatment  11/09/2011  . GERD (gastroesophageal reflux disease)--stable on medication  11/09/2011  . LFT elevation--labs 04/15/2015 within normal limits  11/09/2011  . Depression--stable on meds /he is enjoying life more  11/09/2011  . Gout--no recent episodes  11/09/2011   See cardiology notes  Note office visit recently for bronchitis episode with reactive airway disease thought secondary to his smoking. He responded treatment but still has some dyspnea on exertion and some coughing when he laughs hard  Review of Systems 14 point review of system negative problem list    Objective:   Physical Exam BP 119/80 mmHg  Pulse 70  Temp(Src) 98.5 F (36.9 C) (Oral)  Resp 16  Ht 5' 7.75" (1.721 m)  Wt 221 lb 3.2 oz (100.336 kg)  BMI 33.88 kg/m2 HEENT clear Heart regular without murmur/right normal/no carotid bruits Chest clear to auscultation except mild wheezing on forced expiration bilaterally heard anteriorly No peripheral edema Full peripheral pulses Neurological intact Mood good/affect appropriate/thought content normal/judgment sound      Assessment & Plan:  Need for prophylactic vaccination and inoculation against influenza - Plan: Flu Vaccine QUAD 36+ mos IM  Gastroesophageal reflux disease without esophagitis - Plan: omeprazole (PRILOSEC) 40 MG  capsule  Hyperlipidemia - Plan: pravastatin (PRAVACHOL) 40 MG tablet, Lipid panel  Essential hypertension - Plan: atenolol (TENORMIN) 100 MG tablet, amLODipine (NORVASC) 10 MG tablet, Comprehensive metabolic panel  Screening for HIV (human immunodeficiency virus) - Plan: HIV antibody  Anxiety and depression  Meds ordered this encounter  Medications  . omeprazole (PRILOSEC) 40 MG capsule    Sig: Take 1 capsule (40 mg total) by mouth daily.    Dispense:  90 capsule    Refill:  3  . pravastatin (PRAVACHOL) 40 MG tablet    Sig: TAKE 1 TABLET BY MOUTH EVERY DAY    Dispense:  90 tablet    Refill:  3  . meloxicam (MOBIC) 7.5 MG tablet    Sig: TAKE 1 TABLET (7.5 MG TOTAL) BY MOUTH DAILY.    Dispense:  30 tablet    Refill:  11  . fenofibrate (TRICOR) 145 MG tablet    Sig: TAKE 1 TABLET (145 MG TOTAL) BY MOUTH DAILY.    Dispense:  90 tablet    Refill:  3  . atenolol (TENORMIN) 100 MG tablet    Sig: TAKE 1 TABLET BY MOUTH ONCE DAILY    Dispense:  90 tablet    Refill:  3  . amLODipine (NORVASC) 10 MG tablet    Sig: Take 1 tablet (10 mg total) by mouth daily.    Dispense:  90 tablet    Refill:  3  . ALPRAZolam (XANAX) 1 MG tablet    Sig: TAKE 1 TABLET BY MOUTH 3 TIMES A DAY AS NEEDED FOR ANXIETY    Dispense:  90 tablet    Refill:  5  . beclomethasone (  QVAR) 80 MCG/ACT inhaler    Sig: Inhale 1 puff into the lungs 2 (two) times daily. For 1 month    Dispense:  1 Inhaler    Refill:  1   Follow-up 6 months  Addendum labs Results for orders placed or performed in visit on 06/30/15  Lipid panel  Result Value Ref Range   Cholesterol 255 (H) 125 - 200 mg/dL   Triglycerides 829 (H) <150 mg/dL   HDL 8 (L) >=56 mg/dL   Total CHOL/HDL Ratio 31.9 (H) <=5.0 Ratio   VLDL NOT CALC <30 mg/dL   LDL Cholesterol NOT CALC <130 mg/dL  Comprehensive metabolic panel  Result Value Ref Range   Sodium 135 135 - 146 mmol/L   Potassium 3.9 3.5 - 5.3 mmol/L   Chloride 100 98 - 110 mmol/L   CO2 27  20 - 31 mmol/L   Glucose, Bld 88 65 - 99 mg/dL   BUN 16 7 - 25 mg/dL   Creat 2.13 0.86 - 5.78 mg/dL   Total Bilirubin 0.5 0.2 - 1.2 mg/dL   Alkaline Phosphatase 31 (L) 40 - 115 U/L   AST 29 10 - 40 U/L   ALT 24 9 - 46 U/L   Total Protein 7.8 6.1 - 8.1 g/dL   Albumin 4.6 3.6 - 5.1 g/dL   Calcium 9.8 8.6 - 46.9 mg/dL  HIV antibody  Result Value Ref Range   HIV 1&2 Ab, 4th Generation NONREACTIVE NONREACTIVE   He is adhering to a plan from the lipid clinic and has a follow-up there in one month His meds were refilled and no dose changes were made

## 2015-07-01 MED ORDER — FENOFIBRIC ACID 105 MG PO TABS: 1.0000 | ORAL_TABLET | Freq: Every day | ORAL | Status: DC

## 2015-07-01 NOTE — Telephone Encounter (Signed)
Spoke to pharmacist who stated that the alternative is Fibricor 105 (which is fenofibric acid, and was advised it works in the same way). They have coupons which brings the cost way down for pt. Ok'd change per Dr Merla Riches below.

## 2015-07-03 ENCOUNTER — Encounter: Payer: Self-pay | Admitting: Internal Medicine

## 2015-07-07 ENCOUNTER — Other Ambulatory Visit: Payer: Self-pay | Admitting: Family Medicine

## 2015-07-08 NOTE — Telephone Encounter (Signed)
Dr Merla Riches, you just saw pt, but this med not discussed. OK to RF?

## 2015-07-21 ENCOUNTER — Other Ambulatory Visit (INDEPENDENT_AMBULATORY_CARE_PROVIDER_SITE_OTHER): Payer: BLUE CROSS/BLUE SHIELD | Admitting: *Deleted

## 2015-07-21 DIAGNOSIS — E785 Hyperlipidemia, unspecified: Secondary | ICD-10-CM | POA: Diagnosis not present

## 2015-07-21 LAB — HEPATIC FUNCTION PANEL
ALK PHOS: 30 U/L — AB (ref 40–115)
ALT: 25 U/L (ref 9–46)
AST: 26 U/L (ref 10–40)
Albumin: 4.7 g/dL (ref 3.6–5.1)
BILIRUBIN DIRECT: 0.1 mg/dL (ref <=0.2)
BILIRUBIN INDIRECT: 0.5 mg/dL (ref 0.2–1.2)
BILIRUBIN TOTAL: 0.6 mg/dL (ref 0.2–1.2)
TOTAL PROTEIN: 7.5 g/dL (ref 6.1–8.1)

## 2015-07-21 LAB — LIPID PANEL
Cholesterol: 196 mg/dL (ref 125–200)
HDL: 20 mg/dL — ABNORMAL LOW (ref >=40)
TRIGLYCERIDES: 435 mg/dL — AB (ref <150)
Total CHOL/HDL Ratio: 9.8 Ratio — ABNORMAL HIGH (ref <=5.0)

## 2015-07-26 ENCOUNTER — Other Ambulatory Visit: Payer: Self-pay | Admitting: Pharmacist

## 2015-07-26 DIAGNOSIS — E785 Hyperlipidemia, unspecified: Secondary | ICD-10-CM

## 2015-07-26 MED ORDER — PRAVASTATIN SODIUM 40 MG PO TABS: ORAL_TABLET | ORAL | Status: DC

## 2015-08-23 ENCOUNTER — Other Ambulatory Visit: Payer: Self-pay | Admitting: Internal Medicine

## 2015-08-24 ENCOUNTER — Other Ambulatory Visit: Payer: Self-pay | Admitting: Internal Medicine

## 2015-08-24 NOTE — Telephone Encounter (Signed)
Is patient still having shortness of breath?  Does he need the inhaler refilled?

## 2015-08-26 NOTE — Telephone Encounter (Signed)
[place]  Patient is having pain during intercourse.  Is there something you can prescribe for him.   [phone]

## 2015-08-27 ENCOUNTER — Other Ambulatory Visit (INDEPENDENT_AMBULATORY_CARE_PROVIDER_SITE_OTHER): Payer: BLUE CROSS/BLUE SHIELD | Admitting: *Deleted

## 2015-08-27 DIAGNOSIS — I10 Essential (primary) hypertension: Secondary | ICD-10-CM | POA: Diagnosis not present

## 2015-08-27 DIAGNOSIS — E785 Hyperlipidemia, unspecified: Secondary | ICD-10-CM

## 2015-08-27 LAB — HEPATIC FUNCTION PANEL
ALT: 23 U/L (ref 9–46)
AST: 28 U/L (ref 10–35)
Albumin: 4.3 g/dL (ref 3.6–5.1)
Alkaline Phosphatase: 30 U/L — ABNORMAL LOW (ref 40–115)
BILIRUBIN DIRECT: 0.1 mg/dL (ref <=0.2)
BILIRUBIN INDIRECT: 0.4 mg/dL (ref 0.2–1.2)
TOTAL PROTEIN: 7.5 g/dL (ref 6.1–8.1)
Total Bilirubin: 0.5 mg/dL (ref 0.2–1.2)

## 2015-08-27 LAB — LIPID PANEL
Cholesterol: 199 mg/dL (ref 125–200)
HDL: 12 mg/dL — ABNORMAL LOW (ref >=40)
Total CHOL/HDL Ratio: 16.6 Ratio — ABNORMAL HIGH (ref <=5.0)
Triglycerides: 497 mg/dL — ABNORMAL HIGH (ref <150)

## 2015-08-27 NOTE — Addendum Note (Signed)
Addended by: Tonita PhoenixBOWDEN, ROBIN K on: 08/27/2015 07:37 AM   Modules accepted: Orders

## 2015-08-31 ENCOUNTER — Telehealth: Payer: Self-pay | Admitting: Internal Medicine

## 2015-08-31 NOTE — Telephone Encounter (Signed)
Advised pt that he needs to come in for ov.  He stated that he is out of town working and cannot come in.  I advised him he can probably go the [facility] and ask the pharmacist is there he can get similar until he is seen.

## 2015-08-31 NOTE — Telephone Encounter (Signed)
The only cream I see is ketoconazole.  Can we refill?

## 2015-08-31 NOTE — Telephone Encounter (Signed)
Patient states that he needs a refill on a cream (starts with a "k" and is listed in patient's med list). Patient states that it is painful for him to have intercourse so he would really like this cream as soon as possible. [facility] [place].  [phone]

## 2015-08-31 NOTE — Telephone Encounter (Signed)
Needs OV.  

## 2015-09-03 ENCOUNTER — Other Ambulatory Visit: Payer: Self-pay | Admitting: Internal Medicine

## 2015-09-07 ENCOUNTER — Other Ambulatory Visit: Payer: BLUE CROSS/BLUE SHIELD

## 2015-09-13 ENCOUNTER — Encounter: Payer: Self-pay | Admitting: Internal Medicine

## 2015-09-20 ENCOUNTER — Ambulatory Visit (INDEPENDENT_AMBULATORY_CARE_PROVIDER_SITE_OTHER): Payer: BLUE CROSS/BLUE SHIELD | Admitting: Pharmacist

## 2015-09-20 DIAGNOSIS — E785 Hyperlipidemia, unspecified: Secondary | ICD-10-CM | POA: Diagnosis not present

## 2015-09-20 MED ORDER — PRAVASTATIN SODIUM 80 MG PO TABS: 80.0000 mg | ORAL_TABLET | Freq: Every evening | ORAL | Status: DC

## 2015-09-20 NOTE — Patient Instructions (Signed)
Take your medications with food.  This will help them work better.   Take the Ibuprofen about 30 minutes prior to the Niaspan to help with the flushing and tingling.   Continue all of your medications.  I sent in a new Rx for pravastatin 80mg  so you only take 1 tablet daily.   Cut down alcohol intake to 2-3 beers per day.   Recheck labs in 3 months.

## 2015-09-20 NOTE — Progress Notes (Signed)
Chief Complaint  Patient presents with  . Hyperlipidemia      History of Present Illness: Tanner Gomez is a 49 y.o. male patient of Dr. Mayford Knife who was referred to the lipid clinic due to a history of hypertriglyceridemia.  At his initial visit, his lipids showed a total cholesterol of 288, TAGS of 709, HDL 8.  LDL could not be calculated due to severe hypertriglyceridemia.  He is currently on fenofibrate, Niaspan 2 gm and pravastatin . He states he is compliant and takes his medications before breakfast each morning.  He has had pancreatitis 3 times in the past 10 years.  He states his TG have been has high as 1200 in the past.  His most recent A1c was 5.5. He continues to smoke but has cut back to 1/2ppd.  He doesn't get much aerobic exercise due to working from 6am to 7pm.  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  He reports drinking 4-6 beers per day during the week and more during the weekend.   Pt stops at [employer] for breakfast each morning and has a biscuit, borounds, and a [employer].  His wife fixes him a sandwich on flat bread for lunch.  She will also cook dinner during the week.  They will have chicken, some red meat, broccoli, potatoes, rice, green beans, etc.    Labs:  08/27/15- TC 199, TG 497, HDL 12, LDL not calculated (Niacin  daily, fenofibrate  daily, pravastatin  daily) 03/2015- TC 228, TG 476, HDL 21, LDL 140 (Niacin  daily, fenofibrate  daily and pravastatin  daily) 01/2015- TC 288, TG 709, HDL 8, LDL not calcuated (niacin  daily, fenofibrate , and pravastatin ) 12/2014- TC 230, TG 826, HDL 24, LDL not calculated   Past Medical History  Diagnosis Date  . Hypertension   . Hyperlipidemia   . Pancreatitis   . Tobacco abuse     Current Outpatient Prescriptions  Medication Sig Dispense Refill  . albuterol (PROVENTIL HFA;VENTOLIN HFA) 108 (90 BASE) MCG/ACT  inhaler Inhale 2 puffs into the lungs every 4 (four) hours as needed for wheezing or shortness of breath (cough, shortness of breath or wheezing.). 1 Inhaler 1  . ALPRAZolam (XANAX) 1 MG tablet TAKE 1 TABLET BY MOUTH 3 TIMES A DAY AS NEEDED FOR ANXIETY 90 tablet 5  . amLODipine (NORVASC) 10 MG tablet Take 1 tablet (10 mg total) by mouth daily. 90 tablet 3  . atenolol (TENORMIN) 100 MG tablet TAKE 1 TABLET BY MOUTH ONCE DAILY 90 tablet 3  . ibuprofen (ADVIL,MOTRIN) 800 MG tablet TAKE 1 TABLET BY MOUTH EVERY 8 HOURS AS NEEDED 270 tablet 1  . ketoconazole (NIZORAL) 2 % cream APPLY TOPICALLY DAILY 30 g 6  . meloxicam (MOBIC) 7.5 MG tablet TAKE 1 TABLET (7.5 MG TOTAL) BY MOUTH DAILY. 30 tablet 11  . niacin (NIASPAN) 1000 MG CR tablet TAKE 2 TABLETS BY MOUTH EVERY DAY 180 tablet 2  . omeprazole (PRILOSEC) 40 MG capsule Take 1 capsule (40 mg total) by mouth daily. 90 capsule 3  . pravastatin (PRAVACHOL) 40 MG tablet TAKE 2 TABLETS BY MOUTH EVERY DAY 90 tablet 3  . fenofibrate (TRICOR) 145 MG tablet TAKE 1 TABLET (145 MG TOTAL) BY MOUTH DAILY. 90 tablet 3   No current facility-administered medications for this visit.    Allergies:  Sular    Social History:  The patient  reports that he has been smoking Cigarettes.  He has a 37 pack-year smoking history. He does not have any smokeless tobacco history on file. He reports that he drinks about 19.8 oz of alcohol per week.   Family History:  The patient's family history includes Diabetes in his father; Heart attack in his mother; Heart disease in his mother; Hypertension in his father and mother; Kidney Stones in his sister; Kidney disease in his mother.       ASSESSMENT AND PLAN:  1.  Hyperlipidemia with severe hypertriglyceridemia-  Pt's TG remain severely elevated despite aggressive therapy.  Likely related to alcohol intake.  We had a frank discussion about trying to limit this to prevent long term complications and pancreatitis.  Pt states he  will try to work on this.  Have asked him to cut back to 1-2 beers per day.  We have also adjusted the timing of some of his medications to help with efficacy.  Plan to recheck labs in 3 months.  He has a physical with his PCP and will check labs at that time.   Edrick OhSigned, Errika Narvaiz R, [facility] Center[facility]PH  09/20/2015 2:50 PM    [facility] [address], [place], [place]  [address] Phone: [phone]) [phone]; Fax: [phone]

## 2015-09-25 ENCOUNTER — Other Ambulatory Visit: Payer: Self-pay | Admitting: Family Medicine

## 2015-12-27 ENCOUNTER — Telehealth: Payer: Self-pay

## 2015-12-27 NOTE — Telephone Encounter (Signed)
Wants to talk with dr Merla Richesdoolittle about who he recommends patient to make as his next pcp after he Tanner Nimsretires  Best number [phone]

## 2015-12-29 ENCOUNTER — Ambulatory Visit (INDEPENDENT_AMBULATORY_CARE_PROVIDER_SITE_OTHER): Payer: BLUE CROSS/BLUE SHIELD | Admitting: Internal Medicine

## 2015-12-29 VITALS — BP 129/83 | HR 67 | Temp 98.3°F | Resp 16 | Ht 68.0 in | Wt 219.0 lb

## 2015-12-29 DIAGNOSIS — E785 Hyperlipidemia, unspecified: Secondary | ICD-10-CM | POA: Diagnosis not present

## 2015-12-29 DIAGNOSIS — I10 Essential (primary) hypertension: Secondary | ICD-10-CM

## 2015-12-29 DIAGNOSIS — Z720 Tobacco use: Secondary | ICD-10-CM

## 2015-12-29 DIAGNOSIS — F329 Major depressive disorder, single episode, unspecified: Secondary | ICD-10-CM

## 2015-12-29 DIAGNOSIS — F411 Generalized anxiety disorder: Secondary | ICD-10-CM | POA: Diagnosis not present

## 2015-12-29 DIAGNOSIS — F32A Depression, unspecified: Secondary | ICD-10-CM

## 2015-12-29 DIAGNOSIS — M109 Gout, unspecified: Secondary | ICD-10-CM | POA: Diagnosis not present

## 2015-12-29 DIAGNOSIS — M1A9XX0 Chronic gout, unspecified, without tophus (tophi): Secondary | ICD-10-CM | POA: Diagnosis not present

## 2015-12-29 DIAGNOSIS — Z1211 Encounter for screening for malignant neoplasm of colon: Secondary | ICD-10-CM

## 2015-12-29 DIAGNOSIS — K219 Gastro-esophageal reflux disease without esophagitis: Secondary | ICD-10-CM | POA: Diagnosis not present

## 2015-12-29 LAB — COMPREHENSIVE METABOLIC PANEL
ALBUMIN: 4.9 g/dL (ref 3.6–5.1)
ALT: 20 U/L (ref 9–46)
AST: 30 U/L (ref 10–35)
Alkaline Phosphatase: 35 U/L — ABNORMAL LOW (ref 40–115)
BILIRUBIN TOTAL: 0.6 mg/dL (ref 0.2–1.2)
BUN: 15 mg/dL (ref 7–25)
CALCIUM: 10.3 mg/dL (ref 8.6–10.3)
CHLORIDE: 97 mmol/L — AB (ref 98–110)
CO2: 25 mmol/L (ref 20–31)
Creat: 1.04 mg/dL (ref 0.70–1.33)
Glucose, Bld: 89 mg/dL (ref 65–99)
Potassium: 4 mmol/L (ref 3.5–5.3)
SODIUM: 133 mmol/L — AB (ref 135–146)
Total Protein: 8 g/dL (ref 6.1–8.1)

## 2015-12-29 LAB — LIPID PANEL
CHOLESTEROL: 243 mg/dL — AB (ref 125–200)
HDL: 9 mg/dL — AB (ref >=40)
TRIGLYCERIDES: 531 mg/dL — AB (ref <150)
Total CHOL/HDL Ratio: 27 Ratio — ABNORMAL HIGH (ref <=5.0)

## 2015-12-29 LAB — CBC WITH DIFFERENTIAL/PLATELET
BASOS ABS: 0 10*3/uL (ref 0.0–0.1)
BASOS PCT: 0 % (ref 0–1)
EOS ABS: 0.2 10*3/uL (ref 0.0–0.7)
Eosinophils Relative: 2 % (ref 0–5)
HCT: 45.4 % (ref 39.0–52.0)
HEMOGLOBIN: 15.1 g/dL (ref 13.0–17.0)
LYMPHS ABS: 3.1 10*3/uL (ref 0.7–4.0)
Lymphocytes Relative: 26 % (ref 12–46)
MCH: 31.1 pg (ref 26.0–34.0)
MCHC: 33.3 g/dL (ref 30.0–36.0)
MCV: 93.6 fL (ref 78.0–100.0)
MPV: 10.4 fL (ref 8.6–12.4)
Monocytes Absolute: 1 10*3/uL (ref 0.1–1.0)
Monocytes Relative: 8 % (ref 3–12)
NEUTROS ABS: 7.7 10*3/uL (ref 1.7–7.7)
NEUTROS PCT: 64 % (ref 43–77)
PLATELETS: 310 10*3/uL (ref 150–400)
RBC: 4.85 MIL/uL (ref 4.22–5.81)
RDW: 13.6 % (ref 11.5–15.5)
WBC: 12 10*3/uL — ABNORMAL HIGH (ref 4.0–10.5)

## 2015-12-29 LAB — URIC ACID: Uric Acid, Serum: 5 mg/dL (ref 4.0–7.8)

## 2015-12-29 LAB — POCT GLYCOSYLATED HEMOGLOBIN (HGB A1C): HEMOGLOBIN A1C: 5.2

## 2015-12-29 MED ORDER — ALPRAZOLAM 1 MG PO TABS: ORAL_TABLET | ORAL | Status: DC

## 2015-12-29 NOTE — Patient Instructions (Signed)
     IF you received an x-ray today, you will receive an invoice from [facility]. Please contact [facility] at [phone] with questions or concerns regarding your invoice.   IF you received labwork today, you will receive an invoice from [facility]. Please contact [facility] at [phone] with questions or concerns regarding your invoice.   Our billing staff will not be able to assist you with questions regarding bills from these companies.  You will be contacted with the lab results as soon as they are available. The fastest way to get your results is to activate your My Chart account. Instructions are located on the last page of this paperwork. If you have not heard from us regarding the results in 2 weeks, please contact this office.      

## 2015-12-29 NOTE — Progress Notes (Signed)
   Subjective:    Patient ID: Tanner Gomez, Tanner Gomez    DOB: March 22, 1966, 50 y.o.   MRN: [id number]  HPI  Chief Complaint  Patient presents with  . Medication Refill  Doing very well!!!! 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 Patient Active Problem List   Diagnosis Date Noted  . Pancreatitis--hx of..stable   . Tobacco abuse---down to close to quitting///will soon say has to go outside   . GAD (generalized anxiety disorder)---still needs meds 3x a day esp the one at bedtime to sleep thru night But totally functional at work 07/16/2012  . HTN (hypertension) 11/09/2011  . Hyperlipidemia---triglycerides the issue 11/09/2011  . GERD (gastroesophageal reflux disease) 11/09/2011  . LFT elevation---down to 1-2 beers a day 11/09/2011  . Depression--not recent 11/09/2011  . Gout---none recent 11/09/2011  . Acute pancreatitis--none recent 11/09/2011   Diet change!!!! Cramps feet/hands (even playing guitar) fenofibr is 105 not 145 HM utd  Review of Systems  Constitutional: Negative for fever, activity change, fatigue and unexpected weight change.  HENT: Negative for dental problem and trouble swallowing.   Eyes: Negative for photophobia and visual disturbance.  Respiratory: Negative for cough, choking, chest tightness, shortness of breath and wheezing.   Cardiovascular: Negative for chest pain, palpitations and leg swelling.  Gastrointestinal: Negative for abdominal pain, diarrhea and constipation.  Genitourinary: Negative for dysuria, frequency and difficulty urinating.  Neurological: Negative for weakness, light-headedness, numbness and headaches.  Hematological: Negative for adenopathy. Does not bruise/bleed easily.  Psychiatric/Behavioral: Negative for behavioral problems, confusion, dysphoric  mood and decreased concentration.       Objective:   Physical Exam  Constitutional: He is oriented to person, place, and time. He appears well-developed and well-nourished.  HENT:  Mouth/Throat: Oropharynx is clear and moist.  Eyes: EOM are normal. Pupils are equal, round, and reactive to light.  Neck: No thyromegaly present.  Cardiovascular: Normal rate, regular rhythm, normal heart sounds and intact distal pulses.   No murmur heard. Pulmonary/Chest: Effort normal and breath sounds normal.  Musculoskeletal: He exhibits no edema.  Lymphadenopathy:    He has no cervical adenopathy.  Neurological: He is alert and oriented to person, place, and time. He has normal reflexes. No cranial nerve deficit.  Psychiatric: He has a normal mood and affect. His behavior is normal.  BP 129/83 mmHg  Pulse 67  Temp(Src) 98.3 F (36.8 C)  Resp 16  Ht 5\' 8"  (1.727 m)  Wt 219 lb (99.338 kg)  BMI 33.31 kg/m2  Wt Readings from Last 3 Encounters:  12/29/15 219 lb (99.338 kg)  06/30/15 221 lb 3.2 oz (100.336 kg)  06/11/15 221 lb 6.4 oz (100.426 kg)       Assessment & Plan:  Essential hypertension - Plan: CBC with Differential/Platelet, Comprehensive metabolic panel, PSA, POCT glycosylated hemoglobin (Hb A1C)---perhaps better control with wt loss Dr [facility]  Hyperlipidemia - controlling triglycerides the issue///continue wt loss/meds/etoh reduction/smoking cessation -pravachol 80/tricor 105/145/niaspan--seen in lipids clinic as well  Gastroesophageal reflux disease without esophagitis--stable  Depression-stable--now at a point where this dx may be able to be removed  GAD (generalized anxiety disorder)-stable/over the next several years as he continues to improve he will be able to slowly decrease xanax  Tobacco abuse--cont reducing  Gout-- Plan: Uric Acid/no recent sxt  colonos needed--will refer

## 2015-12-30 LAB — PSA: PSA: 0.18 ng/mL (ref <=4.00)

## 2015-12-31 ENCOUNTER — Encounter: Payer: Self-pay | Admitting: Internal Medicine

## 2016-01-03 ENCOUNTER — Telehealth: Payer: Self-pay

## 2016-01-03 NOTE — Telephone Encounter (Signed)
Pt was checking on the status of his physical paperwork. Dr. Merla Richesoolittle was suppose to fax it in to [facility] for [employer] so he wont have to come back for another physical. Pt will have to have this by Wednesday.  Please advise  [phone]

## 2016-01-04 NOTE — Telephone Encounter (Signed)
Pt states he is not sure if they have it or not. Advised him of his labs he will keep appt for tomorrow to discuss lipids.

## 2016-01-04 NOTE — Telephone Encounter (Signed)
Dr Doolittle 

## 2016-01-05 ENCOUNTER — Encounter: Payer: Self-pay | Admitting: Internal Medicine

## 2016-01-05 ENCOUNTER — Ambulatory Visit (INDEPENDENT_AMBULATORY_CARE_PROVIDER_SITE_OTHER): Payer: BLUE CROSS/BLUE SHIELD | Admitting: Internal Medicine

## 2016-01-05 VITALS — BP 126/83 | HR 70 | Temp 98.2°F | Resp 16 | Ht 68.0 in | Wt 222.0 lb

## 2016-01-05 DIAGNOSIS — R7989 Other specified abnormal findings of blood chemistry: Secondary | ICD-10-CM | POA: Diagnosis not present

## 2016-01-05 DIAGNOSIS — E781 Pure hyperglyceridemia: Secondary | ICD-10-CM | POA: Diagnosis not present

## 2016-01-05 DIAGNOSIS — I10 Essential (primary) hypertension: Secondary | ICD-10-CM | POA: Diagnosis not present

## 2016-01-05 DIAGNOSIS — E785 Hyperlipidemia, unspecified: Secondary | ICD-10-CM | POA: Diagnosis not present

## 2016-01-05 DIAGNOSIS — R945 Abnormal results of liver function studies: Secondary | ICD-10-CM

## 2016-01-05 LAB — LIPID PANEL
Cholesterol: 215 mg/dL — ABNORMAL HIGH (ref 125–200)
HDL: 8 mg/dL — ABNORMAL LOW (ref >=40)
Total CHOL/HDL Ratio: 26.9 Ratio — ABNORMAL HIGH (ref <=5.0)
Triglycerides: 572 mg/dL — ABNORMAL HIGH (ref <150)

## 2016-01-05 MED ORDER — ATORVASTATIN CALCIUM 80 MG PO TABS: 80.0000 mg | ORAL_TABLET | Freq: Every day | ORAL | Status: DC

## 2016-01-05 NOTE — Patient Instructions (Signed)
From an overall look at your health, you are doing better in many ways than you were in 2009, 2010, 2011. And now your dietary changes are going to make everything that much better. The Cardiology evaluation did show a "healthy" heart!! I like your idea of 20lb weight loss!

## 2016-01-05 NOTE — Progress Notes (Signed)
   Subjective:    Patient ID: Tanner Gomez, male    DOB: 1966-08-30, 50 y.o.   MRN: [id number]  HPI follow-up for hypertriglyceridemia His labs need repeating to be sure they were fasting/his last value was 572 He has had 3 episodes of pancreatitis most likely due to this rather than alcohol as was indicated in the hospital. After recent cardiology evaluation he was referred to the lipid clinic and the notes are available in the EMR. He has made significant dietary changes over the last few months and has started to lose weight. After last week he in his partner are researching whole 30 diet. He hopes to lose another 20-30 pounds.    Review of Systems see last visit    Objective:   Physical Exam  Constitutional: He is oriented to person, place, and time. He appears well-developed and well-nourished. No distress.  HENT:  Head: Normocephalic and atraumatic.  Eyes: Pupils are equal, round, and reactive to light.  Neck: Normal range of motion.  Cardiovascular: Normal rate and regular rhythm.   Pulmonary/Chest: Effort normal. No respiratory distress.  Musculoskeletal: Normal range of motion.  Neurological: He is alert and oriented to person, place, and time.  Skin: Skin is warm and dry.  Psychiatric: He has a normal mood and affect. His behavior is normal.  Nursing note and vitals reviewed.  BP 126/83 mmHg  Pulse 70  Temp(Src) 98.2 F (36.8 C)  Resp 16  Ht 5\' 8"  (1.727 m)  Wt 222 lb (100.699 kg)  BMI 33.76 kg/m2      Assessment & Plan:  Hypertriglyceridemia - Plan: Lipid panel  Essential hypertension--Remains controlled on Tenormin and Norvasc  Hyperlipidemia  LFT elevation---This had resolved at his last visit  Results for orders placed or performed in visit on 01/05/16  Lipid panel  Result Value Ref Range   Cholesterol 215 (H) 125 - 200 mg/dL   Triglycerides 409572 (H) <150 mg/dL   HDL 8 (L) >=81>=40 mg/dL   Total CHOL/HDL Ratio 26.9 (H) <=5.0 Ratio   VLDL NOT CALC <30  mg/dL   LDL Cholesterol NOT CALC <130 mg/dL  Over the last year he has gotten very serious about his health and is making great progress. A large part of this was medication to control his anxiety and irritability at work and having a good steady partner area He continues fenofibrate 145 daily, Niaspan thousand 2 tablets daily and his Pravachol 80 will be changed to Lipitor 80 to support his efforts at diet and weight loss and alcohol restriction he will follow-up here in 6 months for repeat labs he may establish follow-up here with anyone as [facility]

## 2016-01-10 NOTE — Addendum Note (Signed)
Addended by: Lucia GaskinsHAAS, Alyrica Thurow C on: 01/10/2016 07:46 AM   Modules accepted: Kipp BroodSmartSet

## 2016-01-14 ENCOUNTER — Other Ambulatory Visit: Payer: Self-pay | Admitting: Cardiology

## 2016-01-19 ENCOUNTER — Telehealth: Payer: Self-pay

## 2016-01-19 NOTE — Telephone Encounter (Signed)
Pt needs copies of his physical paperwork and Lab work sent into [facility] please. His job states that they haven't received it yet. The fax number is [phone].

## 2016-01-21 NOTE — Telephone Encounter (Signed)
IC - Partner Darl PikesSusan did not know if there was a specific form to fax to [facility] or not. I advised I could send the visit info and labs - she agreed that would be good - info needed for his group medical insurance.   Faxed to [facility]  [phone] today.

## 2016-02-03 ENCOUNTER — Other Ambulatory Visit: Payer: Self-pay

## 2016-02-03 DIAGNOSIS — Z0000 Encounter for general adult medical examination without abnormal findings: Secondary | ICD-10-CM

## 2016-03-16 IMAGING — NM NM MISC PROCEDURE
3 series · 18 of 18 positions shown · non-contrast
Comparison: none

[Series 1: stress-sum-em_(id)_sa · 6.4mm · 6.40mm/px · 6 of 64 frames shown]
[frame 6/64]
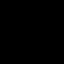
[frame 16/64]
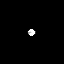
[frame 27/64]
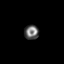
[frame 38/64]
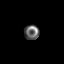
[frame 48/64]
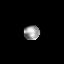
[frame 59/64]
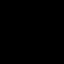

[Series 1: stress-gsp_(id)_sa · 6.4mm · 6.40mm/px · 6 of 512 frames shown]
[frame 43/512]
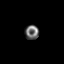
[frame 128/512]
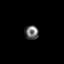
[frame 214/512]
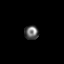
[frame 299/512]
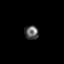
[frame 384/512]
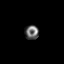
[frame 470/512]
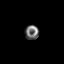

[Series 1: rest_(id)_sa · 6.4mm · 6.40mm/px · 6 of 64 frames shown]
[frame 6/64]
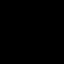
[frame 16/64]
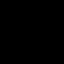
[frame 27/64]
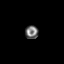
[frame 38/64]
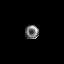
[frame 48/64]
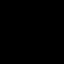
[frame 59/64]
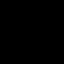

[18 of 18 positions shown; findings below may reference images not displayed]

Canned report from images found in remote index.

Refer to host system for actual result text.

## 2016-05-11 ENCOUNTER — Telehealth: Payer: Self-pay

## 2016-05-12 NOTE — Telephone Encounter (Signed)
error 

## 2016-05-19 ENCOUNTER — Telehealth: Payer: Self-pay

## 2016-05-19 NOTE — Telephone Encounter (Signed)
Pt would like a refill on his  niacin (NIASPAN) 1000 MG CR tablet [[id number]].  [id number] - [place], [place] - [address]   Please advise at [phone]

## 2016-05-19 NOTE — Telephone Encounter (Signed)
Can we refill? 

## 2016-05-20 MED ORDER — NIACIN ER (ANTIHYPERLIPIDEMIC) 1000 MG PO TBCR: 2000.0000 mg | EXTENDED_RELEASE_TABLET | Freq: Every day | ORAL | 0 refills | Status: DC

## 2016-05-20 NOTE — Telephone Encounter (Signed)
Refill provided. Patient can follow up before he runs out again ~2 months. Please let patient know.

## 2016-06-14 ENCOUNTER — Encounter: Payer: Self-pay | Admitting: Family

## 2016-06-14 ENCOUNTER — Ambulatory Visit (INDEPENDENT_AMBULATORY_CARE_PROVIDER_SITE_OTHER): Payer: BLUE CROSS/BLUE SHIELD | Admitting: Family

## 2016-06-14 ENCOUNTER — Other Ambulatory Visit (INDEPENDENT_AMBULATORY_CARE_PROVIDER_SITE_OTHER): Payer: BLUE CROSS/BLUE SHIELD

## 2016-06-14 VITALS — BP 120/74 | HR 67 | Temp 98.1°F | Resp 16 | Ht 68.0 in | Wt 218.0 lb

## 2016-06-14 DIAGNOSIS — E785 Hyperlipidemia, unspecified: Secondary | ICD-10-CM

## 2016-06-14 DIAGNOSIS — R252 Cramp and spasm: Secondary | ICD-10-CM | POA: Diagnosis not present

## 2016-06-14 DIAGNOSIS — I10 Essential (primary) hypertension: Secondary | ICD-10-CM

## 2016-06-14 DIAGNOSIS — Z720 Tobacco use: Secondary | ICD-10-CM | POA: Diagnosis not present

## 2016-06-14 LAB — COMPREHENSIVE METABOLIC PANEL
ALT: 19 U/L (ref 0–53)
AST: 23 U/L (ref 0–37)
Albumin: 4.4 g/dL (ref 3.5–5.2)
Alkaline Phosphatase: 33 U/L — ABNORMAL LOW (ref 39–117)
BILIRUBIN TOTAL: 0.6 mg/dL (ref 0.2–1.2)
BUN: 13 mg/dL (ref 6–23)
CALCIUM: 9.4 mg/dL (ref 8.4–10.5)
CHLORIDE: 103 meq/L (ref 96–112)
CO2: 27 meq/L (ref 19–32)
Creatinine, Ser: 0.95 mg/dL (ref 0.40–1.50)
GFR: 88.89 mL/min (ref >60.00)
Glucose, Bld: 86 mg/dL (ref 70–99)
POTASSIUM: 3.7 meq/L (ref 3.5–5.1)
Sodium: 136 mEq/L (ref 135–145)
Total Protein: 7.7 g/dL (ref 6.0–8.3)

## 2016-06-14 LAB — CBC
HEMATOCRIT: 42.6 % (ref 39.0–52.0)
Hemoglobin: 14.7 g/dL (ref 13.0–17.0)
MCHC: 34.4 g/dL (ref 30.0–36.0)
MCV: 90.9 fl (ref 78.0–100.0)
Platelets: 263 10*3/uL (ref 150.0–400.0)
RBC: 4.69 Mil/uL (ref 4.22–5.81)
RDW: 13.8 % (ref 11.5–15.5)
WBC: 11.5 10*3/uL — ABNORMAL HIGH (ref 4.0–10.5)

## 2016-06-14 LAB — CK: CK TOTAL: 81 U/L (ref 7–232)

## 2016-06-14 LAB — MAGNESIUM: Magnesium: 1.3 mg/dL — ABNORMAL LOW (ref 1.5–2.5)

## 2016-06-14 NOTE — Patient Instructions (Addendum)
Thank you for choosing [facility].  SUMMARY AND INSTRUCTIONS:  Please follow up with the Lipid Clinic :  [facility] [address], [place], [place]  [address] Phone: [phone]; Fax: [phone](336) [phone]   Continue to hydrate well with non-alcoholic/non-caffeinated beverges  Medication:  Please continue to take your medications as prescribed.  Consider 200 mg of magnesium daily.  Labs:  Please stop by the lab on the lower level of the building for your blood work. Your results will be released to MyChart (or called to you) after review, usually within 72 hours after test completion. If any changes need to be made, you will be notified at that same time.  1.) The lab is open from 7:30am to 5:30 pm Monday-Friday 2.) No appointment is necessary 3.) Fasting (if needed) is 6-8 hours after food and drink; black coffee and water are okay   Follow up:  If your symptoms worsen or fail to improve, please contact our office for further instruction, or in case of emergency go directly to the emergency room at the closest medical facility.    Muscle Cramps and Spasms Muscle cramps and spasms occur when a muscle or muscles tighten and you have no control over this tightening (involuntary muscle contraction). They are a common problem and can develop in any muscle. The most common place is in the calf muscles of the leg. Both muscle cramps and muscle spasms are involuntary muscle contractions, but they also have differences:   Muscle cramps are sporadic and painful. They may last a few seconds to a quarter of an hour. Muscle cramps are often more forceful and last longer than muscle spasms.  Muscle spasms may or may not be painful. They may also last just a few seconds or much longer. CAUSES  It is uncommon for cramps or spasms to be due to a serious underlying problem. In many cases, the cause of cramps or spasms is unknown. Some common causes are:    Overexertion.   Overuse from repetitive motions (doing the same thing over and over).   Remaining in a certain position for a long period of time.   Improper preparation, form, or technique while performing a sport or activity.   Dehydration.   Injury.   Side effects of some medicines.   Abnormally low levels of the salts and ions in your blood (electrolytes), especially potassium and calcium. This could happen if you are taking water pills (diuretics) or you are pregnant.  Some underlying medical problems can make it more likely to develop cramps or spasms. These include, but are not limited to:   Diabetes.   Parkinson disease.   Hormone disorders, such as thyroid problems.   Alcohol abuse.   Diseases specific to muscles, joints, and bones.   Blood vessel disease where not enough blood is getting to the muscles.  HOME CARE INSTRUCTIONS   Stay well hydrated. Drink enough water and fluids to keep your urine clear or pale yellow.  It may be helpful to massage, stretch, and relax the affected muscle.  For tight or tense muscles, use a warm towel, heating pad, or hot shower water directed to the affected area.  If you are sore or have pain after a cramp or spasm, applying ice to the affected area may relieve discomfort.  Put ice in a plastic bag.  Place a towel between your skin and the bag.  Leave the ice on for 15-20 minutes, 03-04 times a day.  Medicines used to treat a known cause of cramps or spasms may help reduce their frequency or severity. Only take over-the-counter or prescription medicines as directed by your caregiver. SEEK MEDICAL CARE IF:  Your cramps or spasms get more severe, more frequent, or do not improve over time.  MAKE SURE YOU:   Understand these instructions.  Will watch your condition.  Will get help right away if you are not doing well or get worse.   This information is not intended to replace advice given to you by your  health care provider. Make sure you discuss any questions you have with your health care provider.   Document Released: 03/24/2002 Document Revised: 01/27/2013 Document Reviewed: 09/18/2012 Elsevier Interactive Patient Education [employer]

## 2016-06-14 NOTE — Progress Notes (Signed)
Subjective:    Patient ID: Tanner Gomez, male    DOB: 1966-06-20, 50 y.o.   MRN: [id number]  Chief Complaint  Patient presents with  . Establish Care    feet and hand cramps, a couple weeks ago had an issue with balance being off    HPI:  Tanner RyderLarry W Nephew is a 50 y.o. male who  has a past medical history of Depression; Hyperlipidemia; Hypertension; Pancreatitis; and Tobacco abuse. and presents today for an office visit to establish care.   1.) Muscle cramping - Associated symptom of muscle cramping located in his legs and hands that waxes and wanes occurring on a sporadic nature has been going on for about 1 year. Cramps described as spams. Modifying factors include walking around. Cramps generally occur when he is relaxing and sitting around the house. Denies cramps occurring with activity or work. Occasional numbness in his hands when driving. Hand cramps can occur when he plays guitar.   2.) Hypertension - Currently maintained on atenolol and amlodipine. Reports taking the medication as prescribed and denies adverse side effects or hypotensive readings.Denies worst headache of life or symptoms of end organ damage.   BP Readings from Last 3 Encounters:  06/14/16 120/74  01/05/16 126/83  12/29/15 129/83    3.) Hypertriglyceridemia - Currently maintained on Niacin, Fenofibric and atorvastain. Has been to the lipid clinic in the past. Reports taking the medication as prescribed and denies significant adverse side effects. Does have muscle cramping as described above.   Lab Results  Component Value Date   CHOL 215 (H) 01/05/2016   HDL 8 (L) 01/05/2016   LDLCALC NOT CALC 01/05/2016   LDLDIRECT 140.0 04/15/2015   TRIG 572 (H) 01/05/2016   CHOLHDL 26.9 (H) 01/05/2016       Allergies  Allergen Reactions  . Sular Swelling      Outpatient Medications Prior to Visit  Medication Sig Dispense Refill  . ALPRAZolam (XANAX) 1 MG tablet TAKE 1 TABLET BY MOUTH 3 TIMES A DAY AS NEEDED  FOR ANXIETY 90 tablet 5  . amLODipine (NORVASC) 10 MG tablet Take 1 tablet (10 mg total) by mouth daily. 90 tablet 3  . atorvastatin (LIPITOR) 80 MG tablet Take 1 tablet (80 mg total) by mouth daily. 90 tablet 3  . ibuprofen (ADVIL,MOTRIN) 800 MG tablet TAKE 1 TABLET BY MOUTH EVERY 8 HOURS AS NEEDED 270 tablet 1  . ketoconazole (NIZORAL) 2 % cream APPLY TOPICALLY DAILY 30 g 6  . meloxicam (MOBIC) 7.5 MG tablet TAKE 1 TABLET (7.5 MG TOTAL) BY MOUTH DAILY. 30 tablet 11  . niacin (NIASPAN) 1000 MG CR tablet Take 2 tablets (2,000 mg total) by mouth daily. 180 tablet 0  . omeprazole (PRILOSEC) 40 MG capsule Take 1 capsule (40 mg total) by mouth daily. 90 capsule 3  . atenolol (TENORMIN) 100 MG tablet TAKE 1 TABLET BY MOUTH ONCE DAILY 90 tablet 3  . albuterol (PROVENTIL HFA;VENTOLIN HFA) 108 (90 BASE) MCG/ACT inhaler Inhale 2 puffs into the lungs every 4 (four) hours as needed for wheezing or shortness of breath (cough, shortness of breath or wheezing.). 1 Inhaler 1  . fenofibrate (TRICOR) 145 MG tablet TAKE 1 TABLET (145 MG TOTAL) BY MOUTH DAILY. 90 tablet 3   No facility-administered medications prior to visit.       Past Surgical History:  Procedure Laterality Date  . EYE SURGERY     RIGHT (CHILDHOOD)  . JOINT REPLACEMENT  Past Medical History:  Diagnosis Date  . Depression   . Hyperlipidemia   . Hypertension   . Pancreatitis   . Tobacco abuse     Review of Systems  Constitutional: Negative for chills and fever.  Eyes:       Negative for changes in vision  Respiratory: Negative for cough, chest tightness and wheezing.   Cardiovascular: Negative for chest pain, palpitations and leg swelling.  Neurological: Negative for dizziness, weakness and light-headedness.      Objective:    BP 120/74 (BP Location: Left Arm, Patient Position: Sitting, Cuff Size: Large)   Pulse 67   Temp 98.1 F (36.7 C) (Oral)   Resp 16   Ht 5\' 8"  (1.727 m)   Wt 218 lb (98.9 kg)   SpO2 97%    BMI 33.15 kg/m  Nursing note and vital signs reviewed.  Physical Exam  Constitutional: He is oriented to person, place, and time. He appears well-developed and well-nourished. No distress.  Neck: Neck supple.  Cardiovascular: Normal rate, regular rhythm, normal heart sounds and intact distal pulses.   Pulmonary/Chest: Effort normal and breath sounds normal.  Musculoskeletal: Normal range of motion. He exhibits no edema, tenderness or deformity.  Neurological: He is alert and oriented to person, place, and time.  Skin: Skin is warm and dry.  Psychiatric: He has a normal mood and affect. His behavior is normal. Judgment and thought content normal.       Assessment & Plan:   Problem List Items Addressed This Visit      Cardiovascular and Mediastinum   HTN (hypertension) - Primary    Hypertension is well-controlled below goal 140/90 with current regimen and no adverse side effects. Denies worse headache of life with no symptoms of end organ damage noted upon exam. Continue current dosage of amlodipine and atenolol. Continue to follow low-sodium diet. Monitor blood pressure at home. Continue to monitor.      Relevant Medications   Fenofibric Acid 105 MG TABS   Other Relevant Orders   CBC (Completed)   Comprehensive metabolic panel (Completed)     Other   Hyperlipidemia    Hyperlipidemia and hypertriglyceridemia appear to remain elevated and being followed by the lipid clinic. Patient advised to follow-up with lipid clinic for further assessment and treatments. Continue current dosage of fenofibrate acid, niacin and atorvastatin. There is some concern this combination of medications may be contributing to his muscle cramping. Continue to monitor.      Relevant Medications   Fenofibric Acid 105 MG TABS   Other Relevant Orders   CBC (Completed)   Tobacco abuse   Muscle cramping    Muscle cramping with possible origins including alcoholic intake, dehydration, and possible  medication related effects. No cramping noted upon exam today. Obtain magnesium level, plate metabolic panel, creatine kinase, and CBC. Encouraged to decrease alcohol intake and limit caffeine intake. He indicates he drinks plenty of water. Continue to monitor pending blood work results.      Relevant Orders   CK (Creatine Kinase) (Completed)   Magnesium (Completed)   Comprehensive metabolic panel (Completed)    Other Visit Diagnoses   None.      I have discontinued Mr. Crompton's albuterol and fenofibrate. I am also having him maintain his ketoconazole, omeprazole, meloxicam, atenolol, amLODipine, ibuprofen, ALPRAZolam, atorvastatin, niacin, and Fenofibric Acid.   Meds ordered this encounter  Medications  . Fenofibric Acid 105 MG TABS    Sig: Take 105 mg by mouth.  Follow-up: Return in about 1 month (around 07/15/2016), or if symptoms worsen or fail to improve.  Jeanine Luz, FNP

## 2016-06-14 NOTE — Assessment & Plan Note (Signed)
Hyperlipidemia and hypertriglyceridemia appear to remain elevated and being followed by the lipid clinic. Patient advised to follow-up with lipid clinic for further assessment and treatments. Continue current dosage of fenofibrate acid, niacin and atorvastatin. There is some concern this combination of medications may be contributing to his muscle cramping. Continue to monitor.

## 2016-06-14 NOTE — Assessment & Plan Note (Signed)
Hypertension is well-controlled below goal 140/90 with current regimen and no adverse side effects. Denies worse headache of life with no symptoms of end organ damage noted upon exam. Continue current dosage of amlodipine and atenolol. Continue to follow low-sodium diet. Monitor blood pressure at home. Continue to monitor.

## 2016-06-14 NOTE — Assessment & Plan Note (Signed)
Muscle cramping with possible origins including alcoholic intake, dehydration, and possible medication related effects. No cramping noted upon exam today. Obtain magnesium level, plate metabolic panel, creatine kinase, and CBC. Encouraged to decrease alcohol intake and limit caffeine intake. He indicates he drinks plenty of water. Continue to monitor pending blood work results.

## 2016-06-16 ENCOUNTER — Other Ambulatory Visit: Payer: Self-pay | Admitting: Pharmacist

## 2016-06-16 DIAGNOSIS — E785 Hyperlipidemia, unspecified: Secondary | ICD-10-CM

## 2016-06-22 ENCOUNTER — Other Ambulatory Visit: Payer: BLUE CROSS/BLUE SHIELD | Admitting: *Deleted

## 2016-06-22 DIAGNOSIS — E785 Hyperlipidemia, unspecified: Secondary | ICD-10-CM

## 2016-06-22 LAB — HEPATIC FUNCTION PANEL
ALK PHOS: 37 U/L — AB (ref 40–115)
ALT: 22 U/L (ref 9–46)
AST: 27 U/L (ref 10–35)
Albumin: 4.4 g/dL (ref 3.6–5.1)
BILIRUBIN DIRECT: 0.1 mg/dL (ref <=0.2)
BILIRUBIN INDIRECT: 0.7 mg/dL (ref 0.2–1.2)
BILIRUBIN TOTAL: 0.8 mg/dL (ref 0.2–1.2)
Total Protein: 7.5 g/dL (ref 6.1–8.1)

## 2016-06-22 LAB — LIPID PANEL
Cholesterol: 183 mg/dL (ref 125–200)
HDL: 12 mg/dL — AB (ref >=40)
LDL Cholesterol: 104 mg/dL (ref <130)
Total CHOL/HDL Ratio: 15.3 Ratio — ABNORMAL HIGH (ref <=5.0)
Triglycerides: 333 mg/dL — ABNORMAL HIGH (ref <150)
VLDL: 67 mg/dL — ABNORMAL HIGH (ref <30)

## 2016-06-29 ENCOUNTER — Other Ambulatory Visit: Payer: Self-pay | Admitting: Cardiology

## 2016-06-30 ENCOUNTER — Other Ambulatory Visit: Payer: Self-pay | Admitting: *Deleted

## 2016-06-30 MED ORDER — FENOFIBRIC ACID 105 MG PO TABS: 105.0000 mg | ORAL_TABLET | Freq: Every day | ORAL | 5 refills | Status: DC

## 2016-07-05 NOTE — Progress Notes (Signed)
Patient ID: Tanner Gomez                 DOB: 08-13-1966                    MRN: [id number]     HPI: Tanner Gomez is a 50 y.o. male patient referred to lipid clinic by Dr Mayford Knifeurner. PMH is significant for HTN, HLD, and tobacco abuse. He has previously been seen in the lipid clinic for management of hypertriglyceridemia. He has had pancreatitis 3 times in the past 10 years. Pt is currently taking atorvastatin 80mg , fenofibrate 105mg , and niacin 2g daily.  Pt reports he has been avoiding fast food for the past few months. He has started eating Special K cereal for breakfast. He cut back from eating 2 sandwiches to 1 sandwich for lunch, also has a jello for lunch. Has been avoiding fried foods and bread. Also cutting back on desserts (has been using the jello or a fruit cup as substitute). Will bake chicken for dinner. Does drink 3-4 beers per night after work. Has been working to lose weight - most recently 212 lbs. Walks a lot at work. Also walks his dog - has 3 acres of land. He recently picked up fish oil 1,000mg  capsules and has occasionally been taking 1 a day. Reports adherence to his atorvastatin, fenofibrate, and niacin.  Current Medications: atorvastatin 80mg , fenofibrate 105mg , and niacin 2g daily Risk Factors: history of pancreatitis x3 TG goal: 150mg /dL  Family History: Mother with kidney disease, HTN, heart disease, and heart attack. Father with DM and HTN. Maternal grandmother and paternal grandfather with heart attack.  Social History: Still smoking 1 PPD. Drinks 3-4 beers per night. Denies illicit drug use.  Labs: 06/22/16: TC 183, TG 333, HDL 12, LDL 104, total CK normal at 81 (atorvastatin 80mg , fenofibrate 105mg , and niacin 2g daily) 12/2015: TC 215, TG 572, HDL 8, LDL not calculable, Hgb A1c 5.2% (atorvastatin 80mg , fenofibrate 105mg , and niacin 2g daily) 08/2015: TC 199, TG 497, HDL 12, LDL not calculable   Past Medical History:  Diagnosis Date  . Depression   . Hyperlipidemia    . Hypertension   . Pancreatitis   . Tobacco abuse     Current Outpatient Prescriptions on File Prior to Visit  Medication Sig Dispense Refill  . ALPRAZolam (XANAX) 1 MG tablet TAKE 1 TABLET BY MOUTH 3 TIMES A DAY AS NEEDED FOR ANXIETY 90 tablet 5  . amLODipine (NORVASC) 10 MG tablet Take 1 tablet (10 mg total) by mouth daily. 90 tablet 3  . atenolol (TENORMIN) 100 MG tablet TAKE 1 TABLET BY MOUTH ONCE DAILY 90 tablet 3  . atorvastatin (LIPITOR) 80 MG tablet Take 1 tablet (80 mg total) by mouth daily. 90 tablet 3  . Fenofibric Acid 105 MG TABS Take 1 tablet (105 mg total) by mouth daily. 30 tablet 5  . ibuprofen (ADVIL,MOTRIN) 800 MG tablet TAKE 1 TABLET BY MOUTH EVERY 8 HOURS AS NEEDED 270 tablet 1  . ketoconazole (NIZORAL) 2 % cream APPLY TOPICALLY DAILY 30 g 6  . meloxicam (MOBIC) 7.5 MG tablet TAKE 1 TABLET (7.5 MG TOTAL) BY MOUTH DAILY. 30 tablet 11  . niacin (NIASPAN) 1000 MG CR tablet Take 2 tablets (2,000 mg total) by mouth daily. 180 tablet 0  . omeprazole (PRILOSEC) 40 MG capsule Take 1 capsule (40 mg total) by mouth daily. 90 capsule 3   No current facility-administered medications on file prior to visit.  Allergies  Allergen Reactions  . Sular Swelling    Assessment/Plan:  1. Hypertriglyceridemia - TG improved to 333, this is the lowest they have been in the past 2 years. Pt adherent to atorvastatin, fenofibrate, and niacin. Advised pt to increase his fish oil to 4g daily. Congratulated him on lifestyle improvements. Encouraged him to limit beer to 2 cans per night and switch to sugar free jello. Will follow up with lipid panel and direct LDl in 6 months.   Brad Mcgaughy E. Supple, PharmD, CPP [facility] [address] [place], [place] [address] Phone: [phone]; Fax: [phone] 07/06/2016 11:21 AM

## 2016-07-06 ENCOUNTER — Ambulatory Visit (INDEPENDENT_AMBULATORY_CARE_PROVIDER_SITE_OTHER): Payer: BLUE CROSS/BLUE SHIELD | Admitting: Pharmacist

## 2016-07-06 DIAGNOSIS — E785 Hyperlipidemia, unspecified: Secondary | ICD-10-CM | POA: Diagnosis not present

## 2016-07-06 MED ORDER — OMEGA-3-ACID ETHYL ESTERS 1 G PO CAPS: 2.0000 g | ORAL_CAPSULE | Freq: Two times a day (BID) | ORAL | 11 refills | Status: DC

## 2016-07-06 NOTE — Patient Instructions (Addendum)
It was nice to see you today, your cholesterol looks much better than 6 months ago  Increase fish oil to 4,000mg  a day (4 capsules daily) Continue taking your other cholesterol medications - Lipitor, fenofibrate, and Niacin Cut back on beer to 2 cans a day Look for a sugar free jello  Continue to stay active walking your dog and at work  Follow up with cholesterol lab work in 6 months - Thursday, March 22nd. Come in any time after 7:30am for fasting blood work

## 2016-08-16 ENCOUNTER — Other Ambulatory Visit: Payer: Self-pay | Admitting: Urgent Care

## 2016-08-17 NOTE — Telephone Encounter (Signed)
Rec'd call pt requesting status on refills for his Niacin & alprazolam. Inform pt per chart request for Niacin was sent to Dr. Merla Richesoolittle office. He states he no longer see him Tanner Gomez is his new PCP. Per chart saw Tanner Gomez on 8/17. Notified pt will send niacin, but [facility]. Pls advise on control...[name]/lmb

## 2016-08-18 NOTE — Telephone Encounter (Signed)
Pt call back requesting status on alprazolam refill...[name]/lmb

## 2016-08-20 MED ORDER — ALPRAZOLAM 1 MG PO TABS: ORAL_TABLET | ORAL | 0 refills | Status: DC

## 2016-08-20 NOTE — Telephone Encounter (Signed)
One month prescription printed. Will need follow up for additional.

## 2016-08-21 NOTE — Telephone Encounter (Signed)
[facility] [place]...[name]/lmb

## 2016-09-27 ENCOUNTER — Other Ambulatory Visit: Payer: Self-pay | Admitting: Family

## 2016-09-27 DIAGNOSIS — K219 Gastro-esophageal reflux disease without esophagitis: Secondary | ICD-10-CM

## 2016-09-27 DIAGNOSIS — I10 Essential (primary) hypertension: Secondary | ICD-10-CM

## 2016-09-28 ENCOUNTER — Telehealth: Payer: Self-pay | Admitting: *Deleted

## 2016-09-28 NOTE — Telephone Encounter (Signed)
Rec'd call pt is requesting refill on his Alprazolam to [facility] [place]. Last filled 08/20/16...[name]/lmb

## 2016-09-29 MED ORDER — ALPRAZOLAM 1 MG PO TABS: ORAL_TABLET | ORAL | 0 refills | Status: DC

## 2016-09-29 NOTE — Telephone Encounter (Signed)
Notified pt [facility].../lmb 

## 2016-09-29 NOTE — Telephone Encounter (Signed)
Medication refilled

## 2016-12-02 ENCOUNTER — Other Ambulatory Visit: Payer: Self-pay | Admitting: Family

## 2016-12-14 ENCOUNTER — Telehealth: Payer: Self-pay | Admitting: *Deleted

## 2016-12-14 MED ORDER — ALPRAZOLAM 1 MG PO TABS: ORAL_TABLET | ORAL | 0 refills | Status: DC

## 2016-12-14 NOTE — Telephone Encounter (Signed)
Called pt to verify which pharmacy he would like rx sent. Pt is wanting [facility]...[name]/lmb

## 2016-12-14 NOTE — Telephone Encounter (Signed)
Pt left msg on triage requesting refill on his alprazolam.../lmb 

## 2016-12-14 NOTE — Telephone Encounter (Signed)
Medication refilled

## 2017-01-04 ENCOUNTER — Other Ambulatory Visit: Payer: BLUE CROSS/BLUE SHIELD | Admitting: *Deleted

## 2017-01-04 DIAGNOSIS — E785 Hyperlipidemia, unspecified: Secondary | ICD-10-CM

## 2017-01-04 LAB — HEPATIC FUNCTION PANEL
ALT: 22 IU/L (ref 0–44)
AST: 30 IU/L (ref 0–40)
Albumin: 4.4 g/dL (ref 3.5–5.5)
Alkaline Phosphatase: 45 IU/L (ref 39–117)
BILIRUBIN TOTAL: 0.7 mg/dL (ref 0.0–1.2)
Bilirubin, Direct: 0.19 mg/dL (ref 0.00–0.40)
Total Protein: 7.5 g/dL (ref 6.0–8.5)

## 2017-01-04 LAB — LIPID PANEL
CHOL/HDL RATIO: 26.8 ratio — AB (ref 0.0–5.0)
CHOLESTEROL TOTAL: 214 mg/dL — AB (ref 100–199)
HDL: 8 mg/dL — AB (ref >39)
TRIGLYCERIDES: 688 mg/dL — AB (ref 0–149)

## 2017-01-04 LAB — LDL CHOLESTEROL, DIRECT: LDL DIRECT: 98 mg/dL (ref 0–99)

## 2017-01-04 NOTE — Addendum Note (Signed)
Addended by: Tonita PhoenixBOWDEN, Jilliam Bellmore K on: 01/04/2017 07:32 AM   Modules accepted: Orders

## 2017-01-05 ENCOUNTER — Telehealth: Payer: Self-pay | Admitting: Pharmacist

## 2017-01-05 NOTE — Telephone Encounter (Signed)
Spoke to patient regarding lipid panel. TG significantly elevated at >800. Patient reports adherence to atorvastatin, fenofibrate (taking in the morning after eating breakfast) niacin, and omega-3-acid. We discussed dietary changes that that he was making at his last lipid clinic appointment, and he says he has been maintaining those changes. We scheduled an appointment next week with lipid clinic, and asked patient to bring medications and try to keep a food log of what he eats over the next few days.

## 2017-01-09 ENCOUNTER — Encounter: Payer: Self-pay | Admitting: Pharmacist

## 2017-01-09 ENCOUNTER — Ambulatory Visit (INDEPENDENT_AMBULATORY_CARE_PROVIDER_SITE_OTHER): Payer: BLUE CROSS/BLUE SHIELD | Admitting: Pharmacist

## 2017-01-09 VITALS — Wt 234.0 lb

## 2017-01-09 DIAGNOSIS — E785 Hyperlipidemia, unspecified: Secondary | ICD-10-CM

## 2017-01-09 MED ORDER — FENOFIBRIC ACID 105 MG PO TABS: 105.0000 mg | ORAL_TABLET | Freq: Every day | ORAL | 5 refills | Status: DC

## 2017-01-09 NOTE — Progress Notes (Signed)
Patient ID: Tanner Gomez                 DOB: 26-Jun-1966                    MRN: [id number]     HPI: Tanner Gomez is a 51 y.o. male patient referred to lipid clinic by Dr Mayford Knife. PMH is significant for HTN, HLD, and tobacco abuse. He has previously been seen in the lipid clinic for management of hypertriglyceridemia. He has had pancreatitis 3 times in the past 10 years. He has a history of TGs >1000.    At most recent appointment, patient reported that he had been avoiding fast food for the past few months. He had started eating Special K cereal for breakfast. He cut back from eating 2 sandwiches to 1 sandwich for lunch, also had a jello for lunch. He had been avoiding fried foods and bread. Also cut back on desserts (has been using the jello or a fruit cup as substitute). Will bake chicken for dinner. Does drink 3-4 beers per night after work. Has been working to lose weight - most recently 212 lbs. Walks a lot at work. Also walks his dog - has 3 acres of land. 6 months ago, fish oil was increased to 4 g daily. Encouraged him to limit beer to 2 cans per night and switch to sugar free jello.  Patient presents after lab work showed elevated TG at 688. Reports adherence to his atorvastatin, fenofibrate, and niacin, and that he takes his fenofibrate with food. He notes that the last few months has been eating out more. His job is now more sedentary and more stressful, and he is too tired to do things when he gets home from work. He is not walking his dog as much. He notes that he has been tired/sick for the past couple of months. He is still drinking 4-6 beers a night.  Overall, he understands that his elevated TG and increased weight are due to these poor dietary choices. He expresses that he knows he needs to make a change, particularly to prevent another episode of pancreatitis.   Current Medications: atorvastatin 80mg , fenofibrate 105mg , niacin 2g daily, and fish oil 4g daily  Risk Factors: History  of pancreatitis x3  TG goal: 150mg /dL  Family History: Mother with kidney disease, HTN, heart disease, and heart attack. Father with DM and HTN. Maternal grandmother and paternal grandfather with heart attack.  Social History: Smoking 1 PPD. Drinks 4-6 beers per night. Denies illicit drug use.  Labs: 01/04/17: TC 214, TG 688, HDL 8, LDL-d 98 (atorvastatin 80mg , fenofibrate 105mg , niacin 2g daily, and fish oil 4g daily) 06/22/16: TC 183, TG 333, HDL 12, LDL 104, total CK normal at 81 (atorvastatin 80mg , fenofibrate 105mg , and niacin 2g daily) 12/2015: TC 215, TG 572, HDL 8, LDL not calculable, Hgb A1c 5.2% (atorvastatin 80mg , fenofibrate 105mg , and niacin 2g daily) 08/2015: TC 199, TG 497, HDL 12, LDL not calculable   Past Medical History:  Diagnosis Date  . Depression   . Hyperlipidemia   . Hypertension   . Pancreatitis   . Tobacco abuse     Current Outpatient Prescriptions on File Prior to Visit  Medication Sig Dispense Refill  . ALPRAZolam (XANAX) 1 MG tablet TAKE 1 TABLET BY MOUTH 3 TIMES A DAY AS NEEDED FOR ANXIETY 90 tablet 0  . amLODipine (NORVASC) 10 MG tablet TAKE 1 TABLET (10 MG TOTAL) BY MOUTH DAILY. 90  tablet 3  . atenolol (TENORMIN) 100 MG tablet TAKE 1 TABLET BY MOUTH ONCE DAILY 90 tablet 3  . atorvastatin (LIPITOR) 80 MG tablet Take 1 tablet (80 mg total) by mouth daily. 90 tablet 3  . Coenzyme Q10 60 MG CAPS Take 2 capsules by mouth daily.    . Fenofibric Acid 105 MG TABS TAKE 1 TABLET (105 MG TOTAL) BY MOUTH DAILY. 30 tablet 5  . ibuprofen (ADVIL,MOTRIN) 800 MG tablet TAKE 1 TABLET BY MOUTH EVERY 8 HOURS AS NEEDED 270 tablet 1  . ketoconazole (NIZORAL) 2 % cream APPLY TOPICALLY DAILY 30 g 6  . Magnesium 200 MG TABS Take 1 tablet by mouth.    . meloxicam (MOBIC) 7.5 MG tablet TAKE 1 TABLET (7.5 MG TOTAL) BY MOUTH DAILY. 30 tablet 11  . niacin (NIASPAN) 1000 MG CR tablet TAKE 2 TABLETS (2,000 MG TOTAL) BY MOUTH DAILY. 180 tablet 1  . Omega-3 Fatty Acids (FISH OIL) 1000  MG CAPS Take 4,000 mg by mouth daily.     Marland Kitchen. omeprazole (PRILOSEC) 40 MG capsule TAKE 1 CAPSULE (40 MG TOTAL) BY MOUTH DAILY. 90 capsule 3   No current facility-administered medications on file prior to visit.     Allergies  Allergen Reactions  . Sular Swelling    Assessment/Plan:  Hypertriglyceridemia -  Continue atorvastatin 80mg , fenofibrate 105mg , niacin 2g daily, and fish oil 4g daily. We suggested to try changing fish oil to 2g BID, instead of all 4g at one time, to possibly increase absorption.   We encouraged to start being more active as the weather warms up. We discussed dietary changes that he can make, and provided a handout to assist in food choices to lower triglycerides. We also recommended to to drink 1-2 less beers a night.  Follow up lipid/hepatic panel in 3 months and appointment to discuss diet and lifestyle changes.    Patient was seen with Caroline Moreatie Travis, PharmD Candidate 2018.    Freddie ApleyKelley M. Cleatis PolkaAuten, PharmD  [facility]  [address], [place], [place] [address]  Phone: [phone]; Fax: [phone] 01/09/2017 8:15 AM

## 2017-01-09 NOTE — Patient Instructions (Addendum)
Continue atorvastatin 80mg , fenofibrate 105mg , niacin 2g daily, and omega-3-fatty acid 4 g daily. Try taking the omega-3-fatty acids 2 g (2 pills) after breakfast, and 2 g (2 pills) after lunch.  As the weather gets better, try spending more time outside with your dog.   Food Choices to Lower Your Triglycerides Triglycerides are a type of fat in your blood. High levels of triglycerides can increase the risk of heart disease and stroke. If your triglyceride levels are high, the foods you eat and your eating habits are very important. Choosing the right foods can help lower your triglycerides. What general guidelines do I need to follow?  Lose weight if you are overweight.  Limit or avoid alcohol.  Fill one half of your plate with vegetables and green salads.  Limit fruit to two servings a day. Choose fruit instead of juice.  Make one fourth of your plate whole grains. Look for the word "whole" as the first word in the ingredient list.  Fill one fourth of your plate with lean protein foods.  Enjoy fatty fish (such as salmon, mackerel, sardines, and tuna) three times a week.  Choose healthy fats.  Limit foods high in starch and sugar.  Eat more home-cooked food and less restaurant, buffet, and fast food.  Limit fried foods.  Cook foods using methods other than frying.  Limit saturated fats.  Check ingredient lists to avoid foods with partially hydrogenated oils (trans fats) in them. What foods can I eat? Grains  Whole grains, such as whole wheat or whole grain breads, crackers, cereals, and pasta. Unsweetened oatmeal, bulgur, barley, quinoa, or brown rice. Corn or whole wheat flour tortillas. Vegetables  Fresh or frozen vegetables (raw, steamed, roasted, or grilled). Green salads. Fruits  All fresh, canned (in natural juice), or frozen fruits. Meat and Other Protein Products  Ground beef (85% or leaner), grass-fed beef, or beef trimmed of fat. Skinless chicken or [place].  Ground chicken or [place]. Pork trimmed of fat. All fish and seafood. Eggs. Dried beans, peas, or lentils. Unsalted nuts or seeds. Unsalted canned or dry beans. Dairy  Low-fat dairy products, such as skim or 1% milk, 2% or reduced-fat cheeses, low-fat ricotta or cottage cheese, or plain low-fat yogurt. Fats and Oils  Tub margarines without trans fats. Light or reduced-fat mayonnaise and salad dressings. Avocado. Safflower, olive, or canola oils. Natural peanut or almond butter. The items listed above may not be a complete list of recommended foods or beverages. Contact your dietitian for more options.  What foods are not recommended? Grains  White bread. White pasta. White rice. Cornbread. Bagels, pastries, and croissants. Crackers that contain trans fat. Vegetables  White potatoes. Corn. Creamed or fried vegetables. Vegetables in a cheese sauce. Fruits  Dried fruits. Canned fruit in light or heavy syrup. Fruit juice. Meat and Other Protein Products  Fatty cuts of meat. Ribs, chicken wings, bacon, sausage, bologna, salami, chitterlings, fatback, hot dogs, bratwurst, and packaged luncheon meats. Dairy  Whole or 2% milk, cream, half-and-half, and cream cheese. Whole-fat or sweetened yogurt. Full-fat cheeses. Nondairy creamers and whipped toppings. Processed cheese, cheese spreads, or cheese curds. Sweets and Desserts  Corn syrup, sugars, honey, and molasses. Candy. Jam and jelly. Syrup. Sweetened cereals. Cookies, pies, cakes, donuts, muffins, and ice cream. Fats and Oils  Butter, stick margarine, lard, shortening, ghee, or bacon fat. Coconut, palm kernel, or palm oils. Beverages  Alcohol. Sweetened drinks (such as sodas, lemonade, and fruit drinks or punches). The items listed above may  not be a complete list of foods and beverages to avoid. Contact your dietitian for more information.  This information is not intended to replace advice given to you by your health care provider. Make sure you  discuss any questions you have with your health care provider. Document Released: 07/20/2004 Document Revised: 03/09/2016 Document Reviewed: 08/06/2013 Elsevier Interactive Patient Education  2017 [employer]

## 2017-01-13 ENCOUNTER — Other Ambulatory Visit: Payer: Self-pay | Admitting: Family

## 2017-02-07 ENCOUNTER — Telehealth: Payer: Self-pay | Admitting: *Deleted

## 2017-02-07 NOTE — Telephone Encounter (Signed)
Rec'd call pt requesting refill on his alprazolam and ketoconazole cream [facility] [place]...[name]

## 2017-02-08 ENCOUNTER — Other Ambulatory Visit: Payer: Self-pay | Admitting: Family Medicine

## 2017-02-08 MED ORDER — ALPRAZOLAM 1 MG PO TABS: ORAL_TABLET | ORAL | 0 refills | Status: DC

## 2017-02-08 MED ORDER — KETOCONAZOLE 2 % EX CREA: TOPICAL_CREAM | Freq: Every day | CUTANEOUS | 6 refills | Status: DC

## 2017-02-08 NOTE — Telephone Encounter (Signed)
Resent cream [facility] [place] [facility] on [address]. Pt has been notified refills has [facility].../lmb

## 2017-02-08 NOTE — Telephone Encounter (Signed)
Medications printed and refilled.  

## 2017-02-08 NOTE — Addendum Note (Signed)
Addended by: Deatra James on: 02/08/2017 10:35 AM   Modules accepted: Orders

## 2017-02-12 ENCOUNTER — Other Ambulatory Visit: Payer: Self-pay | Admitting: Family

## 2017-03-29 ENCOUNTER — Telehealth: Payer: Self-pay | Admitting: *Deleted

## 2017-03-29 MED ORDER — ALPRAZOLAM 1 MG PO TABS: ORAL_TABLET | ORAL | 0 refills | Status: DC

## 2017-03-29 NOTE — Telephone Encounter (Signed)
Done hardcopy to Shirron  

## 2017-03-29 NOTE — Telephone Encounter (Signed)
Left msg on triage stating needing refill on his [facility]razolam [facility] [place]. Tammy SoursGreg is out of office until Monday pls advise.....[name]/lmb

## 2017-03-30 NOTE — Telephone Encounter (Signed)
faxed

## 2017-03-30 NOTE — Telephone Encounter (Signed)
Notified pt rx has been faxed...[name]/LMB

## 2017-04-09 ENCOUNTER — Other Ambulatory Visit: Payer: BLUE CROSS/BLUE SHIELD

## 2017-04-10 ENCOUNTER — Other Ambulatory Visit: Payer: Commercial Managed Care - PPO | Admitting: *Deleted

## 2017-04-10 DIAGNOSIS — E785 Hyperlipidemia, unspecified: Secondary | ICD-10-CM

## 2017-04-10 LAB — LIPID PANEL
CHOL/HDL RATIO: 11.2 ratio — AB (ref 0.0–5.0)
CHOLESTEROL TOTAL: 168 mg/dL (ref 100–199)
HDL: 15 mg/dL — AB (ref >39)
TRIGLYCERIDES: 445 mg/dL — AB (ref 0–149)

## 2017-04-10 LAB — HEPATIC FUNCTION PANEL
ALT: 25 IU/L (ref 0–44)
AST: 46 IU/L — AB (ref 0–40)
Albumin: 4.6 g/dL (ref 3.5–5.5)
Alkaline Phosphatase: 40 IU/L (ref 39–117)
Bilirubin Total: 0.6 mg/dL (ref 0.0–1.2)
Bilirubin, Direct: 0.17 mg/dL (ref 0.00–0.40)
Total Protein: 7.4 g/dL (ref 6.0–8.5)

## 2017-04-10 NOTE — Addendum Note (Signed)
Addended by: BOWDEN, ROBIN K on: 04/10/2017 07:33 AM   Modules accepted: Orders  

## 2017-04-10 NOTE — Addendum Note (Signed)
Addended by: Tonita PhoenixBOWDEN, ROBIN K on: 04/10/2017 07:34 AM   Modules accepted: Orders

## 2017-04-10 NOTE — Addendum Note (Signed)
Addended by: Tonita PhoenixBOWDEN, Loie Jahr K on: 04/10/2017 07:33 AM   Modules accepted: Orders

## 2017-04-11 NOTE — Progress Notes (Signed)
Patient ID: Tanner Gomez                 DOB: 03-11-1966                    MRN: [id number]     HPI: Tanner Gomez is a 51 y.o. male patient referred to lipid clinic by Dr. Mayford Knifeurner. PMH is significant for HTN, HLD, and tobacco abuse. He has previously been seen in the lipid clinic for management of hypertriglyceridemia. He has had pancreatitis 3 times in the past 10 years. He has a history of TGs >1000.    At most recent lipid clinic appointment on 01/09/17, patient was encouraged to increase physical activity and improve dietary choices. It was also recommended to reduce beers to 1-2 less per night. Lastly, fish oil was spilt from 4g daily to 2g bid.   Patient presents today after repeat lipid panel showing elevated TG at 445, although improved, and a hepatic function panel showing a slight elevation in AST to 46, insignificant. He reports adherence to his atorvastatin, fenofibrate, niacin, and fish oil. However, he admits that he is having difficulty remembering to take his fish oil twice daily, often missing his after lunch dose. We discussed going back to 4g once daily, but he voiced he would like to continue to try to remember the twice daily dosing. Overall, his diet has much improved. He is eating 1/2 bowl of Honey Nut Cheerios for breakfast, 1 chicken or Malawiturkey sandwich for lunch with a Dole fruit cup (in 100% juice), and chicken breast and broccoli frozen dinners [employer](Lean Cuisines). When he eats out, he is choosing grilled chicken breast and vegetables. He has completely cut out fried foods. He has lost weight since last visit (~19-28 lbs) - he was wearing steel toe boots today, and stated his home weight this morning was 206 lbs. However, he is discouraged by eating the same thing every day, and feels that he is losing his appetite because of his diet restrictions.    Of note, patient is still drinking 3-4 beers/night (~5 nights per week), which is likely a large contributor to his  hypertriglyceridemia.   Current Medications: atorvastatin 80mg , fenofibrate 105mg , niacin 2g daily, and fish oil 2g BID  Risk Factors: History of pancreatitis x3  TG goal: 150mg /dL  Exercise: Starting hiking on the weekends - last weekend 3 mi hike  Family History: Mother with kidney disease, HTN, heart disease, and heart attack. Father with DM and HTN. Maternal grandmother and paternal grandfather with heart attack.  Social History: Smoking 1 PPD. Drinks 4-6 beers per night. Denies illicit drug use.  Labs: 04/10/17: TC 168, TG 445, HDL 15, LDL not calculable (atorvastatin 80mg , fenofibrate 105mg , niacin 2g daily, and fish oil 2g bid) 01/04/17: TC 214, TG 688, HDL 8, LDL-d 98 (atorvastatin 80mg , fenofibrate 105mg , niacin 2g daily, and fish oil 4g daily) 06/22/16: TC 183, TG 333, HDL 12, LDL 104, total CK normal at 81 (atorvastatin 80mg , fenofibrate 105mg , and niacin 2g daily) 12/2015: TC 215, TG 572, HDL 8, LDL not calculable, Hgb A1c 5.2% (atorvastatin 80mg , fenofibrate 105mg , and niacin 2g daily) 08/2015: TC 199, TG 497, HDL 12, LDL not calculable  Past Medical History:  Diagnosis Date  . Depression   . Hyperlipidemia   . Hypertension   . Pancreatitis   . Tobacco abuse     Current Outpatient Prescriptions on File Prior to Visit  Medication Sig Dispense Refill  . ALPRAZolam Prudy Feeler(XANAX)  1 MG tablet TAKE 1 TABLET BY MOUTH 3 TIMES A DAY AS NEEDED FOR ANXIETY 90 tablet 0  . amLODipine (NORVASC) 10 MG tablet TAKE 1 TABLET (10 MG TOTAL) BY MOUTH DAILY. 90 tablet 3  . atenolol (TENORMIN) 100 MG tablet TAKE 1 TABLET BY MOUTH ONCE DAILY 90 tablet 3  . atorvastatin (LIPITOR) 80 MG tablet Take 1 tablet (80 mg total) by mouth daily. 90 tablet 0  . Coenzyme Q10 60 MG CAPS Take 2 capsules by mouth daily.    . Fenofibric Acid 105 MG TABS Take 1 tablet (105 mg total) by mouth daily. 30 tablet 5  . ibuprofen (ADVIL,MOTRIN) 800 MG tablet TAKE 1 TABLET BY MOUTH EVERY 8 HOURS AS NEEDED 270 tablet 1    . ketoconazole (NIZORAL) 2 % cream Apply topically daily. 30 g 6  . Magnesium 200 MG TABS Take 1 tablet by mouth.    . meloxicam (MOBIC) 7.5 MG tablet TAKE 1 TABLET (7.5 MG TOTAL) BY MOUTH DAILY. 30 tablet 11  . niacin (NIASPAN) 1000 MG CR tablet Take 2 tablets (2,000 mg total) by mouth daily. Yearly appt is due in august must make appt for future refills 180 tablet 0  . Omega-3 Fatty Acids (FISH OIL) 1000 MG CAPS Take 4,000 mg by mouth daily.     Marland Kitchen omeprazole (PRILOSEC) 40 MG capsule TAKE 1 CAPSULE (40 MG TOTAL) BY MOUTH DAILY. 90 capsule 3   No current facility-administered medications on file prior to visit.     Allergies  Allergen Reactions  . Sular Swelling    Assessment/Plan:  1. Hypertriglyceridemia - TG above goal of < 150 mg/dL. Pt is already optimized on TG-lowering medications including Lipitor 80mg  daily, fenofibrate 105mg  daily, fish oil 2g BID, and niacin 2g daily. Educated patient on importance of reducing his alcohol intake to no more than 2 beers per night. We spent a good portion of today's appointment reviewing other low fat meal options given his discouragement with eating chicken and broccoli every night. He could benefit from a nutrition or dietitian consult. He expressed interest in this only if his insurance paid for it and it fit into his work schedule. Will forward recommendation to PCP Jeanine Luz) for follow up with this. Follow-up with Lipid Clinic in 3 months.  Allie Bossier, PharmD PGY1 Resident 04/11/2017 8:30 AM  Patient seen with: Margaretmary Dys, PharmD, CPP, [facility] [address], [place], [place] [address] Phone: [phone]; Fax: [phone]

## 2017-04-12 ENCOUNTER — Ambulatory Visit (INDEPENDENT_AMBULATORY_CARE_PROVIDER_SITE_OTHER): Payer: Commercial Managed Care - PPO | Admitting: Pharmacist

## 2017-04-12 VITALS — Wt 215.0 lb

## 2017-04-12 DIAGNOSIS — E785 Hyperlipidemia, unspecified: Secondary | ICD-10-CM

## 2017-04-12 NOTE — Patient Instructions (Signed)
It was great to see you again today!  Please keep up the great work! Your triglycerides are headed in the right direction. One area of improvement would be the servings of alcohol - try to cut back to 2 beers/night on nights that you're drinking.   Call the clinic with any questions regarding diet options and medications.  Follow-up in 3 months

## 2017-04-15 ENCOUNTER — Other Ambulatory Visit: Payer: Self-pay | Admitting: Family

## 2017-04-20 ENCOUNTER — Other Ambulatory Visit: Payer: Self-pay | Admitting: Family

## 2017-05-15 ENCOUNTER — Telehealth: Payer: Self-pay | Admitting: Family

## 2017-05-15 NOTE — Telephone Encounter (Signed)
Patient has scheduled CPE for 8/31.  Patient is requesting refill on script for alprazolam.  [facility] [address]

## 2017-05-16 MED ORDER — ALPRAZOLAM 1 MG PO TABS: ORAL_TABLET | ORAL | 0 refills | Status: DC

## 2017-05-16 NOTE — Telephone Encounter (Signed)
Refill printed. Must keep appointment for additional.

## 2017-05-16 NOTE — Telephone Encounter (Signed)
Last refill was 03/30/17 per [place] [facility]

## 2017-05-17 NOTE — Telephone Encounter (Signed)
Notified pt w/greg response [facility]...[name]/lmb

## 2017-05-18 ENCOUNTER — Other Ambulatory Visit: Payer: Self-pay | Admitting: Family

## 2017-05-19 ENCOUNTER — Other Ambulatory Visit: Payer: Self-pay | Admitting: Family

## 2017-05-22 ENCOUNTER — Other Ambulatory Visit: Payer: Self-pay | Admitting: Family

## 2017-06-15 ENCOUNTER — Ambulatory Visit (INDEPENDENT_AMBULATORY_CARE_PROVIDER_SITE_OTHER): Payer: Commercial Managed Care - PPO | Admitting: Family

## 2017-06-15 ENCOUNTER — Other Ambulatory Visit (INDEPENDENT_AMBULATORY_CARE_PROVIDER_SITE_OTHER): Payer: Commercial Managed Care - PPO

## 2017-06-15 ENCOUNTER — Encounter: Payer: Self-pay | Admitting: Internal Medicine

## 2017-06-15 ENCOUNTER — Encounter: Payer: Self-pay | Admitting: Family

## 2017-06-15 VITALS — BP 116/78 | HR 67 | Temp 98.4°F | Resp 16 | Ht 68.0 in | Wt 206.0 lb

## 2017-06-15 DIAGNOSIS — Z23 Encounter for immunization: Secondary | ICD-10-CM | POA: Diagnosis not present

## 2017-06-15 DIAGNOSIS — Z0000 Encounter for general adult medical examination without abnormal findings: Secondary | ICD-10-CM

## 2017-06-15 DIAGNOSIS — Z1211 Encounter for screening for malignant neoplasm of colon: Secondary | ICD-10-CM | POA: Diagnosis not present

## 2017-06-15 DIAGNOSIS — Z720 Tobacco use: Secondary | ICD-10-CM | POA: Diagnosis not present

## 2017-06-15 DIAGNOSIS — Z0001 Encounter for general adult medical examination with abnormal findings: Secondary | ICD-10-CM | POA: Insufficient documentation

## 2017-06-15 LAB — LIPID PANEL
Cholesterol: 171 mg/dL (ref 0–200)
HDL: 23.2 mg/dL — AB (ref >39.00)
NonHDL: 147.97
Total CHOL/HDL Ratio: 7
Triglycerides: 287 mg/dL — ABNORMAL HIGH (ref 0.0–149.0)
VLDL: 57.4 mg/dL — ABNORMAL HIGH (ref 0.0–40.0)

## 2017-06-15 LAB — CBC
HCT: 40.7 % (ref 39.0–52.0)
HEMOGLOBIN: 13.9 g/dL (ref 13.0–17.0)
MCHC: 34.1 g/dL (ref 30.0–36.0)
MCV: 94 fl (ref 78.0–100.0)
PLATELETS: 270 10*3/uL (ref 150.0–400.0)
RBC: 4.33 Mil/uL (ref 4.22–5.81)
RDW: 13.3 % (ref 11.5–15.5)
WBC: 11.1 10*3/uL — AB (ref 4.0–10.5)

## 2017-06-15 LAB — COMPREHENSIVE METABOLIC PANEL
ALK PHOS: 36 U/L — AB (ref 39–117)
ALT: 21 U/L (ref 0–53)
AST: 26 U/L (ref 0–37)
Albumin: 4.5 g/dL (ref 3.5–5.2)
BILIRUBIN TOTAL: 0.7 mg/dL (ref 0.2–1.2)
BUN: 11 mg/dL (ref 6–23)
CO2: 25 mEq/L (ref 19–32)
Calcium: 9.9 mg/dL (ref 8.4–10.5)
Chloride: 99 mEq/L (ref 96–112)
Creatinine, Ser: 1.02 mg/dL (ref 0.40–1.50)
GFR: 81.57 mL/min (ref >60.00)
GLUCOSE: 88 mg/dL (ref 70–99)
Potassium: 4.4 mEq/L (ref 3.5–5.1)
Sodium: 134 mEq/L — ABNORMAL LOW (ref 135–145)
Total Protein: 7.2 g/dL (ref 6.0–8.3)

## 2017-06-15 LAB — LDL CHOLESTEROL, DIRECT: LDL DIRECT: 95 mg/dL

## 2017-06-15 LAB — PSA: PSA: 0.12 ng/mL (ref 0.10–4.00)

## 2017-06-15 NOTE — Patient Instructions (Addendum)
Thank you for choosing [facility].  SUMMARY AND INSTRUCTIONS:  Please continue to take your medications as prescribed.   We will check your blood work.  Continue to work on decreasing the tobacco use and alcohol.  Labs:  Please stop by the lab on the lower level of the building for your blood work. Your results will be released to MyChart (or called to you) after review, usually within 72 hours after test completion. If any changes need to be made, you will be notified at that same time.  1.) The lab is open from 7:30am to 5:30 pm Monday-Friday 2.) No appointment is necessary 3.) Fasting (if needed) is 6-8 hours after food and drink; black coffee and water are okay   Follow up:  If your symptoms worsen or fail to improve, please contact our office for further instruction, or in case of emergency go directly to the emergency room at the closest medical facility.     Health Maintenance, Male A healthy lifestyle and preventive care is important for your health and wellness. Ask your health care provider about what schedule of regular examinations is right for you. What should I know about weight and diet? Eat a Healthy Diet  Eat plenty of vegetables, fruits, whole grains, low-fat dairy products, and lean protein.  Do not eat a lot of foods high in solid fats, added sugars, or salt.  Maintain a Healthy Weight Regular exercise can help you achieve or maintain a healthy weight. You should:  Do at least 150 minutes of exercise each week. The exercise should increase your heart rate and make you sweat (moderate-intensity exercise).  Do strength-training exercises at least twice a week.  Watch Your Levels of Cholesterol and Blood Lipids  Have your blood tested for lipids and cholesterol every 5 years starting at 52 years of age. If you are at high risk for heart disease, you should start having your blood tested when you are 52 years old. You may need to have your cholesterol  levels checked more often if: ? Your lipid or cholesterol levels are high. ? You are older than 52 years of age. ? You are at high risk for heart disease.  What should I know about cancer screening? Many types of cancers can be detected early and may often be prevented. Lung Cancer  You should be screened every year for lung cancer if: ? You are a current smoker who has smoked for at least 30 years. ? You are a former smoker who has quit within the past 15 years.  Talk to your health care provider about your screening options, when you should start screening, and how often you should be screened.  Colorectal Cancer  Routine colorectal cancer screening usually begins at 52 years of age and should be repeated every 5-10 years until you are 52 years old. You may need to be screened more often if early forms of precancerous polyps or small growths are found. Your health care provider may recommend screening at an earlier age if you have risk factors for colon cancer.  Your health care provider may recommend using home test kits to check for hidden blood in the stool.  A small camera at the end of a tube can be used to examine your colon (sigmoidoscopy or colonoscopy). This checks for the earliest forms of colorectal cancer.  Prostate and Testicular Cancer  Depending on your age and overall health, your health care provider may do certain tests to screen for  prostate and testicular cancer.  Talk to your health care provider about any symptoms or concerns you have about testicular or prostate cancer.  Skin Cancer  Check your skin from head to toe regularly.  Tell your health care provider about any new moles or changes in moles, especially if: ? There is a change in a mole's size, shape, or color. ? You have a mole that is larger than a pencil eraser.  Always use sunscreen. Apply sunscreen liberally and repeat throughout the day.  Protect yourself by wearing long sleeves, pants, a  wide-brimmed hat, and sunglasses when outside.  What should I know about heart disease, diabetes, and high blood pressure?  If you are 66-72 years of age, have your blood pressure checked every 3-5 years. If you are 43 years of age or older, have your blood pressure checked every year. You should have your blood pressure measured twice-once when you are at a hospital or clinic, and once when you are not at a hospital or clinic. Record the average of the two measurements. To check your blood pressure when you are not at a hospital or clinic, you can use: ? An automated blood pressure machine at a pharmacy. ? A home blood pressure monitor.  Talk to your health care provider about your target blood pressure.  If you are between 42-25 years old, ask your health care provider if you should take aspirin to prevent heart disease.  Have regular diabetes screenings by checking your fasting blood sugar level. ? If you are at a normal weight and have a low risk for diabetes, have this test once every three years after the age of 15. ? If you are overweight and have a high risk for diabetes, consider being tested at a younger age or more often.  A one-time screening for abdominal aortic aneurysm (AAA) by ultrasound is recommended for men aged 82-75 years who are current or former smokers. What should I know about preventing infection? Hepatitis B If you have a higher risk for hepatitis B, you should be screened for this virus. Talk with your health care provider to find out if you are at risk for hepatitis B infection. Hepatitis C Blood testing is recommended for:  Everyone born from [birth year] through 1966.  Anyone with known risk factors for hepatitis C.  Sexually Transmitted Diseases (STDs)  You should be screened each year for STDs including gonorrhea and chlamydia if: ? You are sexually active and are younger than 52 years of age. ? You are older than 52 years of age and your health care provider  tells you that you are at risk for this type of infection. ? Your sexual activity has changed since you were last screened and you are at an increased risk for chlamydia or gonorrhea. Ask your health care provider if you are at risk.  Talk with your health care provider about whether you are at high risk of being infected with HIV. Your health care provider may recommend a prescription medicine to help prevent HIV infection.  What else can I do?  Schedule regular health, dental, and eye exams.  Stay current with your vaccines (immunizations).  Do not use any tobacco products, such as cigarettes, chewing tobacco, and e-cigarettes. If you need help quitting, ask your health care provider.  Limit alcohol intake to no more than 2 drinks per day. One drink equals 12 ounces of beer, 5 ounces of wine, or 1 ounces of hard liquor.  Do not  use street drugs.  Do not share needles.  Ask your health care provider for help if you need support or information about quitting drugs.  Tell your health care provider if you often feel depressed.  Tell your health care provider if you have ever been abused or do not feel safe at home. This information is not intended to replace advice given to you by your health care provider. Make sure you discuss any questions you have with your health care provider. Document Released: 03/30/2008 Document Revised: 05/31/2016 Document Reviewed: 07/06/2015 Elsevier Interactive Patient Education  [employer].

## 2017-06-15 NOTE — Progress Notes (Signed)
Subjective:    Patient ID: Tanner Gomez, male    DOB: 1966/10/09, 51 y.o.   MRN: [id number]  Chief Complaint  Patient presents with  . CPE    fasting    HPI:  Tanner Gomez is a 51 y.o. male who presents today for an annual wellness visit.   1) Health Maintenance -   Diet - Averages about 2-3 meals per day and snacks consisting of regular diet; Caffeine intake 1-2 cups per day on average    Exercise - Resistance training periodically and walking on occasion; does walk at work regularly    2) Preventative Exams / Immunizations:  Dental -- Due for exam   Vision -- Due for exam    Health Maintenance  Topic Date Due  . COLONOSCOPY  08/15/2015  . INFLUENZA VACCINE  06/30/2017 (Originally 05/16/2017)  . TETANUS/TDAP  07/29/2018  . HIV Screening  Completed    Immunization History  Administered Date(s) Administered  . Influenza,inj,Quad PF,6+ Mos 06/24/2014, 06/30/2015, 06/15/2017  . Tdap 07/29/2008     Allergies  Allergen Reactions  . Sular Swelling     Outpatient Medications Prior to Visit  Medication Sig Dispense Refill  . ALPRAZolam (XANAX) 1 MG tablet TAKE 1 TABLET BY MOUTH 3 TIMES A DAY AS NEEDED FOR ANXIETY 90 tablet 0  . amLODipine (NORVASC) 10 MG tablet TAKE 1 TABLET (10 MG TOTAL) BY MOUTH DAILY. 90 tablet 3  . atenolol (TENORMIN) 100 MG tablet TAKE 1 TABLET BY MOUTH ONCE DAILY 90 tablet 3  . atorvastatin (LIPITOR) 80 MG tablet TAKE 1 TAB BY MOUTH DAILY 30 tablet 0  . Coenzyme Q10 60 MG CAPS Take 2 capsules by mouth daily.    . Fenofibric Acid 105 MG TABS Take 1 tablet (105 mg total) by mouth daily. 30 tablet 5  . ibuprofen (ADVIL,MOTRIN) 800 MG tablet TAKE 1 TABLET BY MOUTH EVERY 8 HOURS AS NEEDED 270 tablet 1  . ketoconazole (NIZORAL) 2 % cream Apply topically daily. 30 g 6  . Magnesium 200 MG TABS Take 1 tablet by mouth.    . niacin (NIASPAN) 1000 MG CR tablet TAKE 2 TABLETS BY MOUTH DAILY. YEARLY APPOINTMENT IS DUE IN AUGUST 60 tablet 0  . Omega-3  Fatty Acids (FISH OIL) 1000 MG CAPS Take 4,000 mg by mouth daily.     Marland Kitchen omeprazole (PRILOSEC) 40 MG capsule TAKE 1 CAPSULE (40 MG TOTAL) BY MOUTH DAILY. 90 capsule 3  . meloxicam (MOBIC) 7.5 MG tablet TAKE 1 TABLET (7.5 MG TOTAL) BY MOUTH DAILY. 30 tablet 11   No facility-administered medications prior to visit.      Past Medical History:  Diagnosis Date  . Depression   . Hyperlipidemia   . Hypertension   . Pancreatitis   . Tobacco abuse      Past Surgical History:  Procedure Laterality Date  . EYE SURGERY     RIGHT (CHILDHOOD)  . JOINT REPLACEMENT       Family History  Problem Relation Age of Onset  . Kidney disease Mother   . Hypertension Mother   . Heart disease Mother   . Heart attack Mother   . Diabetes Father   . Hypertension Father   . Kidney Stones Sister   . Heart attack Maternal Grandmother   . Heart attack Paternal Grandfather      Social History   Social History  . Marital status: Married    Spouse name: N/A  . Number of children: 0  .  Years of education: 8812   Occupational History  . Engineer, materialslectrician Superintendent    Social History Main Topics  . Smoking status: Current Every Day Smoker    Packs/day: 1.00    Years: 37.00    Types: Cigarettes  . Smokeless tobacco: Never Used  . Alcohol use 19.8 oz/week    21 Cans of beer, 12 Standard drinks or equivalent per week  . Drug use: No  . Sexual activity: Yes    Birth control/ protection: None     Comment: NUMBER OF SEX PARTNERS IN THE LAST 12 MONTHS 1   Other Topics Concern  . Not on file   Social History Narrative   Fun: Ride [place], play guitar       Review of Systems  Constitutional: Denies fever, chills, fatigue, or significant weight gain/loss. HENT: Head: Denies headache or neck pain Ears: Denies changes in hearing, ringing in ears, earache, drainage Nose: Denies discharge, stuffiness, itching, nosebleed, sinus pain Throat: Denies sore throat, hoarseness, dry mouth, sores,  thrush Eyes: Denies loss/changes in vision, pain, redness, blurry/double vision, flashing lights Cardiovascular: Denies chest pain/discomfort, tightness, palpitations, shortness of breath with activity, difficulty lying down, swelling, sudden awakening with shortness of breath Respiratory: Denies shortness of breath, cough, sputum production, wheezing Gastrointestinal: Denies dysphasia, heartburn, change in appetite, nausea, change in bowel habits, rectal bleeding, constipation, diarrhea, yellow skin or eyes Genitourinary: Denies frequency, urgency, burning/pain, blood in urine, incontinence, change in urinary strength. Musculoskeletal: Denies muscle/joint pain, stiffness, back pain, redness or swelling of joints, trauma Skin: Denies rashes, lumps, itching, dryness, color changes, or hair/nail changes Neurological: Denies dizziness, fainting, seizures, weakness, numbness, tingling, tremor Psychiatric - Denies nervousness, stress, depression or memory loss Endocrine: Denies heat or cold intolerance, sweating, frequent urination, excessive thirst, changes in appetite Hematologic: Denies ease of bruising or bleeding     Objective:     BP 116/78 (BP Location: Left Arm, Patient Position: Sitting, Cuff Size: Large)   Pulse 67   Temp 98.4 F (36.9 C) (Oral)   Resp 16   Ht 5\' 8"  (1.727 m)   Wt 206 lb (93.4 kg)   SpO2 98%   BMI 31.32 kg/m  Nursing note and vital signs reviewed.  Physical Exam  Constitutional: He is oriented to person, place, and time. He appears well-developed and well-nourished.  HENT:  Head: Normocephalic.  Right Ear: Hearing, tympanic membrane, external ear and ear canal normal.  Left Ear: Hearing, tympanic membrane, external ear and ear canal normal.  Nose: Nose normal.  Mouth/Throat: Uvula is midline, oropharynx is clear and moist and mucous membranes are normal.  Eyes: Pupils are equal, round, and reactive to light. Conjunctivae and EOM are normal.  Neck: Neck  supple. No JVD present. No tracheal deviation present. No thyromegaly present.  Cardiovascular: Normal rate, regular rhythm, normal heart sounds and intact distal pulses.   Pulmonary/Chest: Effort normal and breath sounds normal.  Abdominal: Soft. Bowel sounds are normal. He exhibits no distension and no mass. There is no tenderness. There is no rebound and no guarding.  Musculoskeletal: Normal range of motion. He exhibits no edema or tenderness.  Lymphadenopathy:    He has no cervical adenopathy.  Neurological: He is alert and oriented to person, place, and time. He has normal reflexes. No cranial nerve deficit. He exhibits normal muscle tone. Coordination normal.  Skin: Skin is warm and dry.  Psychiatric: He has a normal mood and affect. His behavior is normal. Judgment and thought content normal.  Assessment & Plan:   Problem List Items Addressed This Visit      Other   Tobacco abuse    Continues to smoke on a daily basis but is now down to 1/2 pack per day. Encouraged to continue decreasing tobacco intake to reduce risk for cardiovascular, respiratory, or malignant disease in the future. He is in the pre-contemplation stage of change. Continue to monitor.       Routine adult health maintenance - Primary    1) Anticipatory Guidance: Discussed importance of wearing a seatbelt while driving and not texting while driving; changing batteries in smoke detector at least once annually; wearing suntan lotion when outside; eating a balanced and moderate diet; getting physical activity at least 30 minutes per day.  2) Immunizations / Screenings / Labs:  Influenza updated today. All other immunizations are up to date per recommendations. Due for a dental and vision exam encouraged to be completed independently. Obtain PSA for prostate cancer screening. Due for colon cancer screening with referral to GI placed for colonoscopy. All other screenings are up to date per recommendations.  Overall  well exam with risk factors for cardiovascular disease including tobacco use, alcohol use, hyperlipidemia, and hypertension. Continues to work with cardiology to help with lower cholesterol. Blood pressure is well controlled. Encouraged to decrease tobacco and alcohol use. Continue other healthy behaviors and choices. Follow up prevention exam in 1 year and follow up office visit pending blood work and for chronic conditions.   Obtain CBC, CMET, and lipid profile.         Relevant Orders   CBC   Comprehensive metabolic panel   Lipid panel   PSA    Other Visit Diagnoses    Colon cancer screening       Relevant Orders   Ambulatory referral to Gastroenterology   Need for influenza vaccination       Relevant Orders   Flu Vaccine QUAD 36+ mos IM (Completed)       I have discontinued Mr. Console's meloxicam. I am also having him maintain his ibuprofen, Magnesium, Coenzyme Q10, Fish Oil, atenolol, amLODipine, omeprazole, Fenofibric Acid, ketoconazole, ALPRAZolam, atorvastatin, niacin, and NON FORMULARY.   Meds ordered this encounter  Medications  . NON FORMULARY    Sig: Full Spectrum Mineral Caps     Follow-up: Return in about 6 months (around 12/13/2017), or if symptoms worsen or fail to improve.   Jeanine Luz, FNP

## 2017-06-15 NOTE — Assessment & Plan Note (Signed)
1) Anticipatory Guidance: Discussed importance of wearing a seatbelt while driving and not texting while driving; changing batteries in smoke detector at least once annually; wearing suntan lotion when outside; eating a balanced and moderate diet; getting physical activity at least 30 minutes per day.  2) Immunizations / Screenings / Labs:  Influenza updated today. All other immunizations are up to date per recommendations. Due for a dental and vision exam encouraged to be completed independently. Obtain PSA for prostate cancer screening. Due for colon cancer screening with referral to GI placed for colonoscopy. All other screenings are up to date per recommendations.  Overall well exam with risk factors for cardiovascular disease including tobacco use, alcohol use, hyperlipidemia, and hypertension. Continues to work with cardiology to help with lower cholesterol. Blood pressure is well controlled. Encouraged to decrease tobacco and alcohol use. Continue other healthy behaviors and choices. Follow up prevention exam in 1 year and follow up office visit pending blood work and for chronic conditions.   Obtain CBC, CMET, and lipid profile.

## 2017-06-15 NOTE — Assessment & Plan Note (Signed)
Continues to smoke on a daily basis but is now down to 1/2 pack per day. Encouraged to continue decreasing tobacco intake to reduce risk for cardiovascular, respiratory, or malignant disease in the future. He is in the pre-contemplation stage of change. Continue to monitor.

## 2017-06-18 ENCOUNTER — Encounter: Payer: Self-pay | Admitting: Family

## 2017-06-18 ENCOUNTER — Other Ambulatory Visit: Payer: Self-pay | Admitting: Family

## 2017-07-10 ENCOUNTER — Telehealth: Payer: Self-pay | Admitting: Family

## 2017-07-10 ENCOUNTER — Other Ambulatory Visit: Payer: Commercial Managed Care - PPO

## 2017-07-10 DIAGNOSIS — E785 Hyperlipidemia, unspecified: Secondary | ICD-10-CM

## 2017-07-10 LAB — HEPATIC FUNCTION PANEL
ALK PHOS: 43 IU/L (ref 39–117)
ALT: 20 IU/L (ref 0–44)
AST: 44 IU/L — AB (ref 0–40)
Albumin: 4.5 g/dL (ref 3.5–5.5)
BILIRUBIN TOTAL: 0.6 mg/dL (ref 0.0–1.2)
BILIRUBIN, DIRECT: 0.16 mg/dL (ref 0.00–0.40)
Total Protein: 7.3 g/dL (ref 6.0–8.5)

## 2017-07-10 LAB — LIPID PANEL
Chol/HDL Ratio: 26.9 ratio — ABNORMAL HIGH (ref 0.0–5.0)
Cholesterol, Total: 215 mg/dL — ABNORMAL HIGH (ref 100–199)
HDL: 8 mg/dL — ABNORMAL LOW (ref >39)
Triglycerides: 622 mg/dL (ref 0–149)

## 2017-07-10 NOTE — Telephone Encounter (Signed)
Pt called requesting a refill on his ALPRAZolam (XANAX) 1 MG tablet.  [facility] [address]

## 2017-07-11 MED ORDER — ALPRAZOLAM 1 MG PO TABS: ORAL_TABLET | ORAL | 0 refills | Status: DC

## 2017-07-11 NOTE — Telephone Encounter (Signed)
[facility].../LMB

## 2017-07-11 NOTE — Progress Notes (Signed)
Patient ID: Tanner Gomez                 DOB: [birth year]                    MRN: [id number]     HPI: Tanner Gomez is a 51 y.o. male patient referred to lipid clinic by Dr. Mayford Knife. PMH is significant for HTN, HLD, and tobacco abuse. He has previously been seen in the lipid clinic for management of hypertriglyceridemia. He has had pancreatitis 3 times in the past 10 years with a history of TGs >1000. At lipid clinic appointment on 01/09/17, patient was encouraged to increase physical activity and improve dietary choices. It was also recommended to reduce beers to 1-2 less per night. Lastly, fish oil was spilt from 4g daily to 2g bid. At most recent lipid clinic visit on 04/12/17 pt's TGs had improved from /dL to /dL. He states he is compliant with all medications, except has difficulty taking fish oil twice daily. His diet was much improved, fried foods were eliminated, and he lost weight (~19-28lbs) since visit in March. However, he did state that he was discouraged by eating the same thing every day, and felt that he was losing his appetite because of his diet restrictions. Of note, patient was still drinking 3-4 beers/night (~5 nights per week), which is likely a large contributor to his hypertriglyceridemia. Pt was educated on reducing alcohol intake to no more than 2 beers/night.   Pt presents today for f/u. Pt had TGs checked 3 weeks ago and they were significantly lower than labs this week (287 vs 622). He states that he is complaint with all medications and was fasting during his last labs. He does not have trouble any longer with BID dosing of fish oil. Pt mentions he has been having some cramping, but this has resolved with the addition of Full Spectrum Mineral Caps that he bought at a vitamin store. His diet has stayed consistent since last visit with cheerios with 2% milk or boiled eggs for breakfast, [place] sandwich with mustard for lunch, fruit mix with melons for snacks, and frozen  chicken and broccoli meals for dinner.  He states that he does get tired of eating the frozen meals every day and sometimes eats PB&J sandwiches for dinner. He states the weekend before his most recent TGs reading, he had 8-10 beers which was most likely the contributor to the spike in TGs from 3 weeks before.  He states he is still drinking around 3-4 beers/night, but some nights does not drink at all d/t going to bed earlier (~8pm). During the day he mostly drinks water.  He has 1/2 can of [place]. Dew at lunch and 100% grape juice every other day or so. He is very pleased with the amount of weight he has lost since last visit in March, he weighed 234lbs and states this morning he weighed 202lbs. Pt would still be interested in dietician if insurance covered, stated that no one followed up with him about this from his PCP office.   Current Medications: atorvastatin , fenofibrate , niacin 2g daily, and fish oil 2g BID  Risk Factors: History of pancreatitis x3  TG goal: 150mg /dL  Diet: 1/2 bowl of Honey Nut Cheerios for breakfast, 1 chicken or [place] sandwich for lunch with fresh fruit mix, and chicken breast and broccoli frozen dinners [employer]).  When he eats out he is choosing grilled chicken and greens.  Exercise: Pt has started working out at home and walks his dog for exercise.   Family History: Mother with kidney disease, HTN, heart disease, and heart attack. Father with DM and HTN. Maternal grandmother and paternal grandfather with heart attack.  Social History: Smoking 1 PPD. Drinks 4-6beers per night. Denies illicit drug use.  Labs: 07/10/17: TC 215, TG 622, HDL 8, LDL not calculable (atorvastatin , fenofibrate , niacin 2g daily, and fish oil 2g bid) 06/15/17: TC 171, TG 287, HDL 23, LDL-d 95 04/10/17: TC 168, TG 445, HDL 15, LDL not calculable (atorvastatin , fenofibrate , niacin 2g daily, and fish oil 2g bid) 01/04/17: TC 214, TG 688, HDL 8, LDL-d 98  (atorvastatin , fenofibrate , niacin 2g daily, and fish oil 4g daily) 06/22/16: TC 183, TG 333, HDL 12, LDL 104, total CK normal at 81 (atorvastatin , fenofibrate , and niacin 2g daily) 12/2015: TC 215, TG 572,HDL 8, LDL not calculable, Hgb A1c 5.2% (atorvastatin , fenofibrate , and niacin 2g daily) 08/2015: TC 199, TG 497, HDL 12, LDL not calculable  Past Medical History:  Diagnosis Date  . Depression   . Hyperlipidemia   . Hypertension   . Pancreatitis   . Tobacco abuse     Current Outpatient Prescriptions on File Prior to Visit  Medication Sig Dispense Refill  . ALPRAZolam (XANAX) 1 MG tablet TAKE 1 TABLET BY MOUTH 3 TIMES A DAY AS NEEDED FOR ANXIETY 90 tablet 0  . amLODipine (NORVASC) 10 MG tablet TAKE 1 TABLET (10 MG TOTAL) BY MOUTH DAILY. 90 tablet 3  . atenolol (TENORMIN) 100 MG tablet TAKE 1 TABLET BY MOUTH ONCE DAILY 90 tablet 3  . atorvastatin (LIPITOR) 80 MG tablet TAKE 1 TAB BY MOUTH DAILY 30 tablet 0  . Coenzyme Q10 60 MG CAPS Take 2 capsules by mouth daily.    . Fenofibric Acid 105 MG TABS Take 1 tablet (105 mg total) by mouth daily. 30 tablet 5  . ibuprofen (ADVIL,MOTRIN) 800 MG tablet TAKE 1 TABLET BY MOUTH EVERY 8 HOURS AS NEEDED 270 tablet 1  . ketoconazole (NIZORAL) 2 % cream Apply topically daily. 30 g 6  . Magnesium 200 MG TABS Take 1 tablet by mouth.    . niacin (NIASPAN) 1000 MG CR tablet TAKE 2 TABLETS BY MOUTH DAILY. YEARLY APPOINTMENT IS DUE IN AUGUST 60 tablet 0  . NON FORMULARY Full Spectrum Mineral Caps    . Omega-3 Fatty Acids (FISH OIL) 1000 MG CAPS Take 4,000 mg by mouth daily.     Marland Kitchen omeprazole (PRILOSEC) 40 MG capsule TAKE 1 CAPSULE (40 MG TOTAL) BY MOUTH DAILY. 90 capsule 3   No current facility-administered medications on file prior to visit.     Allergies  Allergen Reactions  . Sular Swelling    Assessment/Plan:  1. Hypertriglyceridemia: TGs above goal of < 150 mg/dL due to excessive alcohol and sugar intake. Pt is  compliant with TG-lowering medications including Lipitor  daily, fenofibrate  daily, fish oil 2g BID, and niacin 2g daily. Plan to increase fenofibrate dose to  daily, and continue all other medications. Congratulated pt on weight loss. Provided handout on healthy eating, and encouraged using [email or web address] to find healthy recipes he can cook at home to replace frozen meals at dinner. Goals for pt include decrease alcohol intake to no more than 2 beers/night, reducing sugar intake, and switching from regular sodas to diet.  Will f/u with lipid panel and in lipid clinic in 6 months. Will also  route note to PCP to help with referral to nutritionist.  Pt seen with pharmacy student, Dutch Quint   Aiden Helzer E. Annais Crafts, PharmD, CPP, BCACP [facility] [address] [place], [place] [address] Phone: [phone]; Fax: [phone] 07/12/2017 9:39 AM

## 2017-07-11 NOTE — Telephone Encounter (Signed)
[facility] reviewed with no irregularities. Medication printed to be faxed.  

## 2017-07-12 ENCOUNTER — Ambulatory Visit (INDEPENDENT_AMBULATORY_CARE_PROVIDER_SITE_OTHER): Payer: Commercial Managed Care - PPO | Admitting: Pharmacist

## 2017-07-12 DIAGNOSIS — E782 Mixed hyperlipidemia: Secondary | ICD-10-CM

## 2017-07-12 MED ORDER — FENOFIBRATE 160 MG PO TABS: 160.0000 mg | ORAL_TABLET | Freq: Every day | ORAL | 3 refills | Status: DC

## 2017-07-12 NOTE — Patient Instructions (Addendum)
Thank you for coming to see [place] today!  Your triglycerides were above goal this week at 622.  Our goal for you is <150.  At your appointment 3 weeks ago your triglycerides were much improved at 287.   Continue taking your atorvastatin  daily, niacin 2g daily, and fish oil 2g twice daily.   Discontinue taking your fenofibrate , and start the fenofibrate  daily. Take with food to help the medication to be better absorbed.   Try to reduce alcohol intake to no more than 2 beers per night.   Try to switch to diet drinks or flavored water.   Great job on working on [employer], keep up the good work!  We will see you in 6 months!  Have a great day!

## 2017-07-20 ENCOUNTER — Other Ambulatory Visit: Payer: Self-pay | Admitting: Family

## 2017-07-22 ENCOUNTER — Other Ambulatory Visit: Payer: Self-pay | Admitting: Family

## 2017-08-01 ENCOUNTER — Ambulatory Visit (AMBULATORY_SURGERY_CENTER): Payer: Self-pay

## 2017-08-01 VITALS — Ht 68.5 in | Wt 203.5 lb

## 2017-08-01 DIAGNOSIS — Z1211 Encounter for screening for malignant neoplasm of colon: Secondary | ICD-10-CM

## 2017-08-01 NOTE — Progress Notes (Signed)
No allergies to eggs or soy No diet meds No home oxygen No past problems with anesthesia  Registered emmi 

## 2017-08-15 ENCOUNTER — Encounter: Payer: Self-pay | Admitting: Internal Medicine

## 2017-08-15 ENCOUNTER — Encounter: Payer: Commercial Managed Care - PPO | Admitting: Gastroenterology

## 2017-08-16 ENCOUNTER — Encounter: Payer: Commercial Managed Care - PPO | Admitting: Internal Medicine

## 2017-08-22 ENCOUNTER — Encounter: Payer: Self-pay | Admitting: Internal Medicine

## 2017-08-22 ENCOUNTER — Ambulatory Visit (AMBULATORY_SURGERY_CENTER): Payer: Commercial Managed Care - PPO | Admitting: Internal Medicine

## 2017-08-22 VITALS — BP 151/82 | HR 65 | Temp 98.9°F | Resp 19 | Ht 67.5 in | Wt 203.0 lb

## 2017-08-22 DIAGNOSIS — Z1211 Encounter for screening for malignant neoplasm of colon: Secondary | ICD-10-CM | POA: Diagnosis present

## 2017-08-22 DIAGNOSIS — Z1212 Encounter for screening for malignant neoplasm of rectum: Secondary | ICD-10-CM

## 2017-08-22 MED ORDER — SODIUM CHLORIDE 0.9 % IV SOLN: 500.0000 mL | INTRAVENOUS | Status: DC

## 2017-08-22 NOTE — Op Note (Signed)
[facility] Patient Name: Tanner GildingLarry Balash Procedure Date: 08/22/2017 1:24 PM MRN: [id number] Endoscopist: Iva Booparl E Jeanelle Dake , MD Age: [age] Referring MD:  Date of Birth: 06-29-1966 Gender: Male Account #: [id number] Procedure:                Colonoscopy Indications:              Screening for colorectal malignant neoplasm, This                            is the patient's first colonoscopy Medicines:                Propofol per Anesthesia, Monitored Anesthesia Care Procedure:                Pre-Anesthesia Assessment:                           - Prior to the procedure, a History and Physical                            was performed, and patient medications and                            allergies were reviewed. The patient's tolerance of                            previous anesthesia was also reviewed. The risks                            and benefits of the procedure and the sedation                            options and risks were discussed with the patient.                            All questions were answered, and informed consent                            was obtained. Prior Anticoagulants: The patient has                            taken no previous anticoagulant or antiplatelet                            agents. ASA Grade Assessment: II - A patient with                            mild systemic disease. After reviewing the risks                            and benefits, the patient was deemed in                            satisfactory condition to undergo the procedure.  After obtaining informed consent, the colonoscope                            was passed under direct vision. Throughout the                            procedure, the patient's blood pressure, pulse, and                            oxygen saturations were monitored continuously. The                            Colonoscope was introduced through the anus and   advanced to the the cecum, identified by                            appendiceal orifice and ileocecal valve. The                            colonoscopy was performed without difficulty. The                            patient tolerated the procedure well. The quality                            of the bowel preparation was excellent. The bowel                            preparation used was Miralax. The ileocecal valve,                            appendiceal orifice, and rectum were photographed. Scope In: 1:38:25 PM Scope Out: 1:50:57 PM Scope Withdrawal Time: 0 hours 10 minutes 53 seconds  Total Procedure Duration: 0 hours 12 minutes 32 seconds  Findings:                 The perianal and digital rectal examinations were                            normal. Pertinent negatives include normal prostate                            (size, shape, and consistency).                           The entire examined colon appeared normal on direct                            and retroflexion views. Complications:            No immediate complications. Estimated Blood Loss:     Estimated blood loss: none. Impression:               - The entire examined colon is normal on direct and  retroflexion views.                           - No specimens collected. Recommendation:           - Patient has a contact number available for                            emergencies. The signs and symptoms of potential                            delayed complications were discussed with the                            patient. Return to normal activities tomorrow.                            Written discharge instructions were provided to the                            patient.                           - Resume previous diet.                           - Continue present medications.                           - Repeat colonoscopy in 10 years for screening                            purposes. Iva Boop, MD 08/22/2017 2:00:13 PM This report has been signed electronically.

## 2017-08-22 NOTE — Progress Notes (Signed)
A and O x3. Report to RN. Tolerated MAC anesthesia well.

## 2017-08-22 NOTE — Progress Notes (Addendum)
Patient with congested cough. CRNA and wife confirm that the patient developed a cough over the weekend. Dr. Leone PayorGessner in and discussed with patient and caregiver results and suggestions for his cough and cold.  on discharge coughing ceased.

## 2017-08-22 NOTE — Patient Instructions (Addendum)
   Normal colonoscopy! No polyps or cancer seen.  Next routine colonoscopy or other screening test in 10 years - 2028  I appreciate the opportunity to care for you. Carl E. Gessner, MD, FACG   YOU HAD AN ENDOSCOPIC PROCEDURE TODAY AT THE [facility]:   Refer to the procedure report that was given to you for any specific questions about what was found during the examination.  If the procedure report does not answer your questions, please call your gastroenterologist to clarify.  If you requested that your care partner not be given the details of your procedure findings, then the procedure report has been included in a sealed envelope for you to review at your convenience later.  YOU SHOULD EXPECT: Some feelings of bloating in the abdomen. Passage of more gas than usual.  Walking can help get rid of the air that was put into your GI tract during the procedure and reduce the bloating. If you had a lower endoscopy (such as a colonoscopy or flexible sigmoidoscopy) you may notice spotting of blood in your stool or on the toilet paper. If you underwent a bowel prep for your procedure, you may not have a normal bowel movement for a few days.  Please Note:  You might notice some irritation and congestion in your nose or some drainage.  This is from the oxygen used during your procedure.  There is no need for concern and it should clear up in a day or so.  SYMPTOMS TO REPORT IMMEDIATELY:   Following lower endoscopy (colonoscopy or flexible sigmoidoscopy):  Excessive amounts of blood in the stool  Significant tenderness or worsening of abdominal pains  Swelling of the abdomen that is new, acute  Fever of 100F or higher For urgent or emergent issues, a gastroenterologist can be reached at any hour by calling [phone].   DIET:  We do recommend a small meal at first, but then you may proceed to your regular diet.  Drink plenty of fluids but you should avoid alcoholic beverages  for 24 hours.  ACTIVITY:  You should plan to take it easy for the rest of today and you should NOT DRIVE or use heavy machinery until tomorrow (because of the sedation medicines used during the test).    FOLLOW UP: Our staff will call the number listed on your records the next business day following your procedure to check on you and address any questions or concerns that you may have regarding the information given to you following your procedure. If we do not reach you, we will leave a message.  However, if you are feeling well and you are not experiencing any problems, there is no need to return our call.  We will assume that you have returned to your regular daily activities without incident.  If any biopsies were taken you will be contacted by phone or by letter within the next 1-3 weeks.  Please call us at [phone] if you have not heard about the biopsies in 3 weeks.    SIGNATURES/CONFIDENTIALITY: You and/or your care partner have signed paperwork which will be entered into your electronic medical record.  These signatures attest to the fact that that the information above on your After Visit Summary has been reviewed and is understood.  Full responsibility of the confidentiality of this discharge information lies with you and/or your care-partner. 

## 2017-08-23 ENCOUNTER — Other Ambulatory Visit: Payer: Self-pay

## 2017-08-23 ENCOUNTER — Telehealth: Payer: Self-pay | Admitting: *Deleted

## 2017-08-23 MED ORDER — ATORVASTATIN CALCIUM 80 MG PO TABS: 80.0000 mg | ORAL_TABLET | Freq: Every day | ORAL | 0 refills | Status: DC

## 2017-08-23 NOTE — Telephone Encounter (Signed)
  Follow up Call-  Call back number 08/22/2017  Post procedure Call Back phone  # [phone]  Permission to leave phone message Yes  Some recent data might be hidden     Patient questions:  Do you have a fever, pain , or abdominal swelling? No. Pain Score  0 *  Have you tolerated food without any problems? Yes.    Have you been able to return to your normal activities? Yes.    Do you have any questions about your discharge instructions: Diet   No. Medications  No. Follow up visit  No.  Do you have questions or concerns about your Care? No.  Actions: * If pain score is 4 or above: No action needed, pain <4.

## 2017-08-24 ENCOUNTER — Other Ambulatory Visit: Payer: Self-pay

## 2017-08-24 MED ORDER — NIACIN ER (ANTIHYPERLIPIDEMIC) 1000 MG PO TBCR: EXTENDED_RELEASE_TABLET | ORAL | 0 refills | Status: DC

## 2017-08-28 ENCOUNTER — Telehealth: Payer: Self-pay | Admitting: Internal Medicine

## 2017-08-28 MED ORDER — ALPRAZOLAM 1 MG PO TABS: ORAL_TABLET | ORAL | 0 refills | Status: DC

## 2017-08-28 NOTE — Telephone Encounter (Signed)
Done hardcopy to Shirron  

## 2017-08-28 NOTE — Telephone Encounter (Signed)
Patient requesting refill on alprazolam.  [facility] [address]  Patient is leaving Thursday for out of town and would like to have filled by then.

## 2017-08-29 NOTE — Telephone Encounter (Signed)
Faxed

## 2017-09-19 ENCOUNTER — Other Ambulatory Visit: Payer: Self-pay | Admitting: Nurse Practitioner

## 2017-09-21 ENCOUNTER — Other Ambulatory Visit: Payer: Self-pay | Admitting: Nurse Practitioner

## 2017-09-21 DIAGNOSIS — I10 Essential (primary) hypertension: Secondary | ICD-10-CM

## 2017-10-02 MED ORDER — ATENOLOL 100 MG PO TABS: 100.0000 mg | ORAL_TABLET | Freq: Every day | ORAL | 0 refills | Status: DC

## 2017-10-02 NOTE — Telephone Encounter (Signed)
Pt is schedule to see Morrie Sheldonshley 12/12/17. Per office policy sent enough refillsto local pharmacy until appt...[name]/lmb

## 2017-10-03 ENCOUNTER — Telehealth: Payer: Self-pay | Admitting: Nurse Practitioner

## 2017-10-03 NOTE — Telephone Encounter (Signed)
Copied from CRM [phone]. Topic: Quick Communication - See Telephone Encounter >> Oct 03, 2017  4:52 PM Windy KalataMichael, Styles Fambro L, NT wrote: CRM for notification. See Telephone encounter for:  10/03/17.  Pt needs niacin refilled and he has already contacted his pharmacy. [facility]

## 2017-10-03 NOTE — Telephone Encounter (Signed)
Patient has a transfer care appointment on 2/27.

## 2017-10-04 MED ORDER — NIACIN ER (ANTIHYPERLIPIDEMIC) 1000 MG PO TBCR: EXTENDED_RELEASE_TABLET | ORAL | 1 refills | Status: DC

## 2017-10-04 NOTE — Telephone Encounter (Signed)
Per office policy sent enough refills to local pharmacy until appt.../lmb  

## 2017-10-14 ENCOUNTER — Other Ambulatory Visit: Payer: Self-pay | Admitting: Family

## 2017-10-14 DIAGNOSIS — I10 Essential (primary) hypertension: Secondary | ICD-10-CM

## 2017-10-17 ENCOUNTER — Telehealth: Payer: Self-pay | Admitting: Nurse Practitioner

## 2017-10-17 NOTE — Telephone Encounter (Signed)
Xanax refill. Last refill on 08/28/17 with 90 tabs.

## 2017-10-17 NOTE — Telephone Encounter (Signed)
Copied from CRM [id number]. Topic: Quick Communication - Rx Refill/Question >> Oct 17, 2017  3:47 PM Gerrianne ScalePayne, Javaughn Opdahl L wrote: Has the patient contacted their pharmacy? No.   (Agent: If no, request that the patient contact the pharmacy for the refill.)ALPRAZolam Prudy Feeler(XANAX) 1 MG tablet   Preferred Pharmacy (with phone number or street name): [id number] [place], [place] - [address] [phone] (Phone) [phone] (Fax)     Agent: Please be advised that RX refills may take up to 3 business days. We ask that you follow-up with your pharmacy.

## 2017-10-18 MED ORDER — ALPRAZOLAM 1 MG PO TABS: ORAL_TABLET | ORAL | 1 refills | Status: DC

## 2017-10-18 NOTE — Telephone Encounter (Signed)
Notified pt rx sent to [facility]../lmb  

## 2017-10-18 NOTE — Telephone Encounter (Signed)
Pt is scheduled to see Dr. Jonny RuizJohn on 12/12/17. Pls advise on refill...[name]/lmb

## 2017-10-18 NOTE — Telephone Encounter (Signed)
Done erx 

## 2017-10-22 ENCOUNTER — Other Ambulatory Visit: Payer: Self-pay | Admitting: Family

## 2017-10-22 DIAGNOSIS — K219 Gastro-esophageal reflux disease without esophagitis: Secondary | ICD-10-CM

## 2017-10-26 ENCOUNTER — Other Ambulatory Visit: Payer: Self-pay | Admitting: Family

## 2017-10-26 ENCOUNTER — Telehealth: Payer: Self-pay | Admitting: Internal Medicine

## 2017-10-26 DIAGNOSIS — I10 Essential (primary) hypertension: Secondary | ICD-10-CM

## 2017-10-26 DIAGNOSIS — K219 Gastro-esophageal reflux disease without esophagitis: Secondary | ICD-10-CM

## 2017-10-26 NOTE — Telephone Encounter (Signed)
Copied from CRM [id number]. Topic: Quick Communication - See Telephone Encounter >> Oct 26, 2017  4:39 PM Cipriano BunkerLambe, Annette S wrote: CRM for notification. See Telephone encounter for:   [facility] [address] [facility] his medication was declined. He is asking for refills for:  Omerozole Amlodipin Atenolol   He is transfer from Dr. Adriana Simasook to Dr. Jonny RuizJohn Please call patient,  He needs his blood pressure medicine and heartburn.    10/26/17.

## 2017-10-29 MED ORDER — AMLODIPINE BESYLATE 10 MG PO TABS: 10.0000 mg | ORAL_TABLET | Freq: Every day | ORAL | 0 refills | Status: DC

## 2017-10-29 MED ORDER — OMEPRAZOLE 40 MG PO CPDR: 40.0000 mg | DELAYED_RELEASE_CAPSULE | Freq: Every day | ORAL | 0 refills | Status: DC

## 2017-10-29 MED ORDER — ATENOLOL 100 MG PO TABS: 100.0000 mg | ORAL_TABLET | Freq: Every day | ORAL | 0 refills | Status: DC

## 2017-11-27 ENCOUNTER — Other Ambulatory Visit: Payer: Self-pay | Admitting: Nurse Practitioner

## 2017-11-27 NOTE — Telephone Encounter (Signed)
Pt establishing with you on 2/27. Please advise on refill.

## 2017-12-12 ENCOUNTER — Ambulatory Visit: Payer: Commercial Managed Care - PPO | Admitting: Internal Medicine

## 2017-12-12 ENCOUNTER — Ambulatory Visit (INDEPENDENT_AMBULATORY_CARE_PROVIDER_SITE_OTHER)
Admission: RE | Admit: 2017-12-12 | Discharge: 2017-12-12 | Disposition: A | Discharge status: Home / Self Care | Payer: Commercial Managed Care - PPO | Source: Ambulatory Visit | Attending: Internal Medicine | Admitting: Internal Medicine

## 2017-12-12 ENCOUNTER — Other Ambulatory Visit (INDEPENDENT_AMBULATORY_CARE_PROVIDER_SITE_OTHER): Payer: Commercial Managed Care - PPO

## 2017-12-12 ENCOUNTER — Encounter: Payer: Self-pay | Admitting: Internal Medicine

## 2017-12-12 VITALS — BP 110/76 | HR 70 | Temp 98.4°F | Ht 67.5 in | Wt 201.0 lb

## 2017-12-12 DIAGNOSIS — I10 Essential (primary) hypertension: Secondary | ICD-10-CM

## 2017-12-12 DIAGNOSIS — K219 Gastro-esophageal reflux disease without esophagitis: Secondary | ICD-10-CM

## 2017-12-12 DIAGNOSIS — R079 Chest pain, unspecified: Secondary | ICD-10-CM

## 2017-12-12 DIAGNOSIS — Z0000 Encounter for general adult medical examination without abnormal findings: Secondary | ICD-10-CM | POA: Diagnosis not present

## 2017-12-12 DIAGNOSIS — M549 Dorsalgia, unspecified: Secondary | ICD-10-CM

## 2017-12-12 LAB — TSH: TSH: 2.87 u[IU]/mL (ref 0.35–4.50)

## 2017-12-12 LAB — CBC WITH DIFFERENTIAL/PLATELET
Basophils Absolute: 0.1 10*3/uL (ref 0.0–0.1)
Basophils Relative: 0.7 % (ref 0.0–3.0)
EOS ABS: 0.3 10*3/uL (ref 0.0–0.7)
Eosinophils Relative: 2.5 % (ref 0.0–5.0)
HCT: 41.8 % (ref 39.0–52.0)
HEMOGLOBIN: 14.4 g/dL (ref 13.0–17.0)
LYMPHS ABS: 2.8 10*3/uL (ref 0.7–4.0)
Lymphocytes Relative: 27.5 % (ref 12.0–46.0)
MCHC: 34.6 g/dL (ref 30.0–36.0)
MCV: 92.3 fl (ref 78.0–100.0)
MONO ABS: 1.1 10*3/uL — AB (ref 0.1–1.0)
Monocytes Relative: 10.3 % (ref 3.0–12.0)
NEUTROS PCT: 59 % (ref 43.0–77.0)
Neutro Abs: 6 10*3/uL (ref 1.4–7.7)
Platelets: 286 10*3/uL (ref 150.0–400.0)
RBC: 4.53 Mil/uL (ref 4.22–5.81)
RDW: 12.9 % (ref 11.5–15.5)
WBC: 10.2 10*3/uL (ref 4.0–10.5)

## 2017-12-12 LAB — HEPATIC FUNCTION PANEL
ALBUMIN: 4.5 g/dL (ref 3.5–5.2)
ALK PHOS: 35 U/L — AB (ref 39–117)
ALT: 29 U/L (ref 0–53)
AST: 47 U/L — AB (ref 0–37)
BILIRUBIN DIRECT: 0.1 mg/dL (ref 0.0–0.3)
TOTAL PROTEIN: 7.6 g/dL (ref 6.0–8.3)
Total Bilirubin: 0.5 mg/dL (ref 0.2–1.2)

## 2017-12-12 LAB — BASIC METABOLIC PANEL
BUN: 16 mg/dL (ref 6–23)
CALCIUM: 10.1 mg/dL (ref 8.4–10.5)
CO2: 25 meq/L (ref 19–32)
Chloride: 102 mEq/L (ref 96–112)
Creatinine, Ser: 1.19 mg/dL (ref 0.40–1.50)
GFR: 68.14 mL/min (ref >60.00)
GLUCOSE: 96 mg/dL (ref 70–99)
POTASSIUM: 4.2 meq/L (ref 3.5–5.1)
SODIUM: 136 meq/L (ref 135–145)

## 2017-12-12 LAB — LIPID PANEL
CHOL/HDL RATIO: 8
Cholesterol: 171 mg/dL (ref 0–200)
HDL: 21.3 mg/dL — AB (ref >39.00)
Triglycerides: 411 mg/dL — ABNORMAL HIGH (ref 0.0–149.0)

## 2017-12-12 LAB — LDL CHOLESTEROL, DIRECT: Direct LDL: 94 mg/dL

## 2017-12-12 LAB — PSA: PSA: 0.17 ng/mL (ref 0.10–4.00)

## 2017-12-12 MED ORDER — ATENOLOL 100 MG PO TABS: 100.0000 mg | ORAL_TABLET | Freq: Every day | ORAL | 3 refills | Status: DC

## 2017-12-12 MED ORDER — ATORVASTATIN CALCIUM 80 MG PO TABS: 80.0000 mg | ORAL_TABLET | Freq: Every day | ORAL | 3 refills | Status: DC

## 2017-12-12 MED ORDER — FENOFIBRATE 160 MG PO TABS: 160.0000 mg | ORAL_TABLET | Freq: Every day | ORAL | 3 refills | Status: DC

## 2017-12-12 MED ORDER — ALPRAZOLAM 1 MG PO TABS: ORAL_TABLET | ORAL | 1 refills | Status: DC

## 2017-12-12 MED ORDER — OMEPRAZOLE 40 MG PO CPDR: 40.0000 mg | DELAYED_RELEASE_CAPSULE | Freq: Every day | ORAL | 3 refills | Status: DC

## 2017-12-12 MED ORDER — AMLODIPINE BESYLATE 10 MG PO TABS: 10.0000 mg | ORAL_TABLET | Freq: Every day | ORAL | 3 refills | Status: DC

## 2017-12-12 MED ORDER — CYCLOBENZAPRINE HCL 5 MG PO TABS: 5.0000 mg | ORAL_TABLET | Freq: Three times a day (TID) | ORAL | 2 refills | Status: DC | PRN

## 2017-12-12 NOTE — Progress Notes (Signed)
Subjective:    Patient ID: Tanner Gomez, male    DOB: 06-10-1966, 52 y.o.   MRN: [id number]  HPI  Here for wellness and f/u;  Overall doing ok;  Pt denies worsening SOB, DOE, wheezing, orthopnea, PND, worsening LE edema, palpitations, dizziness or syncope.  Pt denies neurological change such as new headache, facial or extremity weakness.  Pt denies polydipsia, polyuria, or low sugar symptoms. Pt states overall good compliance with treatment and medications, good tolerability, and has been trying to follow appropriate diet.  Pt denies worsening depressive symptoms, suicidal ideation or panic. No fever, night sweats, wt loss, loss of appetite, or other constitutional symptoms.  Pt states good ability with ADL's, has low fall risk, home safety reviewed and adequate, no other significant changes in hearing or vision, and only occasionally active with exercise. Pt does also c/o constant mild to mod pain right posterior chest/flank area for several weeks, not worse to work out at the gym and nonexertional, nonpleuritic, but maybe worse to move the right arm overhead or twist at the waist.  Cont's to smoke, not willing to consider quitting at this time. No fever, cough, wheezing.  No other interval hx or new complaints  Denies worsening reflux, abd pain, dysphagia, n/v, bowel change or blood. Past Medical History:  Diagnosis Date  . Depression   . Hyperlipidemia   . Hypertension   . Pancreatitis   . Tobacco abuse    Past Surgical History:  Procedure Laterality Date  . EYE SURGERY     RIGHT (CHILDHOOD)  . JOINT REPLACEMENT      reports that he has been smoking cigarettes.  He has a 37.00 pack-year smoking history. he has never used smokeless tobacco. He reports that he drinks about 19.8 oz of alcohol per week. He reports that he does not use drugs. family history includes Diabetes in his father; Heart attack in his maternal grandmother, mother, and paternal grandfather; Heart disease in his mother;  Hypertension in his father and mother; Kidney Stones in his sister; Kidney disease in his mother. Allergies  Allergen Reactions  . Sular Swelling   Current Outpatient Medications on File Prior to Visit  Medication Sig Dispense Refill  . Bisacodyl (DULCOLAX PO) Take by mouth. 10mg  x 2 for colonoscopy prep    . Coenzyme Q10 60 MG CAPS Take 2 capsules by mouth daily.    Marland Kitchen. ibuprofen (ADVIL,MOTRIN) 800 MG tablet TAKE 1 TABLET BY MOUTH EVERY 8 HOURS AS NEEDED 270 tablet 1  . ketoconazole (NIZORAL) 2 % cream Apply topically daily. 30 g 6  . Magnesium 200 MG TABS Take 1 tablet by mouth.    . NON FORMULARY Full Spectrum Mineral Caps    . OVER THE COUNTER MEDICATION Minerals for "bad cramps" strengthens bones and joints and stuff     No current facility-administered medications on file prior to visit.    Review of Systems Constitutional: Negative for other unusual diaphoresis, sweats, appetite or weight changes HENT: Negative for other worsening hearing loss, ear pain, facial swelling, mouth sores or neck stiffness.   Eyes: Negative for other worsening pain, redness or other visual disturbance.  Respiratory: Negative for other stridor or swelling Cardiovascular: Negative for other palpitations or other chest pain  Gastrointestinal: Negative for worsening diarrhea or loose stools, blood in stool, distention or other pain Genitourinary: Negative for hematuria, flank pain or other change in urine volume.  Musculoskeletal: Negative for myalgias or other joint swelling.  Skin: Negative for other  color change, or other wound or worsening drainage.  Neurological: Negative for other syncope or numbness. Hematological: Negative for other adenopathy or swelling Psychiatric/Behavioral: Negative for hallucinations, other worsening agitation, SI, self-injury, or new decreased concentration All other system neg per pt    Objective:   Physical Exam BP 110/76   Pulse 70   Temp 98.4 F (36.9 C) (Oral)    Ht 5' 7.5" (1.715 m)   Wt 201 lb (91.2 kg)   SpO2 95%   BMI 31.02 kg/m  VS noted,  Constitutional: Pt is oriented to person, place, and time. Appears well-developed and well-nourished, in no significant distress and comfortable Head: Normocephalic and atraumatic  Eyes: Conjunctivae and EOM are normal. Pupils are equal, round, and reactive to light Right Ear: External ear normal without discharge Left Ear: External ear normal without discharge Nose: Nose without discharge or deformity Mouth/Throat: Oropharynx is without other ulcerations and moist  Neck: Normal range of motion. Neck supple. No JVD present. No tracheal deviation present or significant neck LA or mass Cardiovascular: Normal rate, regular rhythm, normal heart sounds and intact distal pulses.   Pulmonary/Chest: WOB normal and breath sounds without rales or wheezing  Abdominal: Soft. Bowel sounds are normal. NT. No HSM  Musculoskeletal: Normal range of motion. Exhibits no edema Lymphadenopathy: Has no other cervical adenopathy.  Neurological: Pt is alert and oriented to person, place, and time. Pt has normal reflexes. No cranial nerve deficit. Motor grossly intact, Gait intact Skin: Skin is warm and dry. No rash noted or new ulcerations Psychiatric:  Has normal mood and affect. Behavior is normal without agitation No other exam findings    Assessment & Plan:

## 2017-12-12 NOTE — Patient Instructions (Signed)
Please take all new medication as prescribed - the flexeril muscle relaxer as needed  Please stop smoking  Please continue all other medications as before, and refills have been done if requested.  Please have the pharmacy call with any other refills you may need.  Please continue your efforts at being more active, low cholesterol diet, and weight control.  You are otherwise up to date with prevention measures today.  Please keep your appointments with your specialists as you may have planned  Please go to the XRAY Department in the Basement (go straight as you get off the elevator) for the x-ray testing  Please go to the LAB in the Basement (turn left off the elevator) for the tests to be done today  You will be contacted by phone if any changes need to be made immediately.  Otherwise, you will receive a letter about your results with an explanation, but please check with MyChart first.  Please remember to sign up for MyChart if you have not done so, as this will be important to you in the future with finding out test results, communicating by private email, and scheduling acute appointments online when needed.  Please return in 1 year for your yearly visit, or sooner if needed, with Lab testing done 3-5 days before

## 2017-12-15 DIAGNOSIS — R079 Chest pain, unspecified: Secondary | ICD-10-CM | POA: Insufficient documentation

## 2017-12-15 DIAGNOSIS — M549 Dorsalgia, unspecified: Secondary | ICD-10-CM | POA: Insufficient documentation

## 2017-12-15 NOTE — Assessment & Plan Note (Signed)

## 2017-12-15 NOTE — Assessment & Plan Note (Signed)
Also for cxr, as well as UA with labs, further eval or tx pending results

## 2017-12-15 NOTE — Assessment & Plan Note (Signed)
stable overall by history and exam, recent data reviewed with pt, and pt to continue medical treatment as before,  to f/u any worsening symptoms or concerns  

## 2017-12-15 NOTE — Assessment & Plan Note (Addendum)
Atypical, doubt cardiac, declines ecg, for muscle relaxer prn  In addition to the time spent performing CPE, I spent an additional 25 minutes face to face,in which greater than 50% of this time was spent in counseling and coordination of care for patient's acute illness as documented, including the differential dx, treatment, further evaluation and other management of chest pain/back pain, GERD and HTN

## 2018-01-08 ENCOUNTER — Other Ambulatory Visit: Payer: Commercial Managed Care - PPO | Admitting: *Deleted

## 2018-01-08 DIAGNOSIS — E782 Mixed hyperlipidemia: Secondary | ICD-10-CM

## 2018-01-08 LAB — LIPID PANEL
CHOLESTEROL TOTAL: 143 mg/dL (ref 100–199)
Chol/HDL Ratio: 6.5 ratio — ABNORMAL HIGH (ref 0.0–5.0)
HDL: 22 mg/dL — ABNORMAL LOW (ref >39)
LDL Calculated: 50 mg/dL (ref 0–99)
TRIGLYCERIDES: 356 mg/dL — AB (ref 0–149)
VLDL Cholesterol Cal: 71 mg/dL — ABNORMAL HIGH (ref 5–40)

## 2018-01-08 LAB — LDL CHOLESTEROL, DIRECT: LDL DIRECT: 73 mg/dL (ref 0–99)

## 2018-01-09 NOTE — Progress Notes (Signed)
Patient ID: Tanner Gomez                 DOB: [birth year]                    MRN: [id number]     HPI: Tanner Gomez is a 52 y.o. male patient referred to lipid clinic by Dr. Mayford Knife. PMH is significant for HTN, HLD, and tobacco abuse. He has previously been seen in the lipid clinic for management of hypertriglyceridemia. He has had pancreatitis 3 times in the past 10 years with a history of TGs >1000. At his last lipid visit, fenofibrate dose was increased from 105mg  to 160mg  daily and pt was advised to limit alcohol and sugar intake. F/u TG improved from 411 to 356. Pt presents today for follow up.  Pt presents today in good spirits. He has been exercising more frequently and started going to the gym 3 days a week where he walks on incline on the treadmill and uses the bicycle. He has made improvements in his diet as well. For lunch, he eats a [place] sandwich with mustard, 2 bananas, and an orange. In the evening, he eats [employer] weight watcher meals. He has switched to diet soda and has stopped drinking fruit juice. He goes out to eat twice weekly and tries to eat healthier options like grilled chicken and sweet potatoes. He does continue to drink 2-4 beers per night and is drinking ~20 beers per week.   Current Medications: atorvastatin 80mg , fenofibrate 160mg , niacin 2g daily, and fish oil 2g BID  Risk Factors: History of pancreatitis x3  TG goal: 150mg /dL  Diet: 1 chicken or [place] sandwich for lunch with fresh fruit, and chicken breast and broccoli frozen dinners [employer]).  When he eats out he is choosing grilled chicken and greens.   Exercise: Pt has started going to [employer] 3 days per week - speed walking on the treadmill on incline and bicycle. Walks his dog for exercise.   Family History: Mother with kidney disease, HTN, heart disease, and heart attack. Father with DM and HTN. Maternal grandmother and paternal grandfather with heart attack.  Social History: Smoking 1  PPD. Drinks 4-6beers per night. Denies illicit drug use.  Labs: 01/08/18: TC 143, TG 356, HDL 22, LDL-D 73 (atorvastatin 80mg  daily, fenofibrate 160mg  daily, niacin 2g daily, fish oil 2g bid) 07/10/17: TC 215, TG 622, HDL 8, LDL not calculable (atorvastatin 80mg , fenofibrate 105mg , niacin 2g daily, and fish oil 2g bid) 06/15/17: TC 171, TG 287, HDL 23, LDL-d 95 04/10/17: TC 168, TG 445, HDL 15, LDL not calculable (atorvastatin 80mg , fenofibrate 105mg , niacin 2g daily, and fish oil 2g bid) 01/04/17: TC 214, TG 688, HDL 8, LDL-d 98 (atorvastatin 80mg , fenofibrate 105mg , niacin 2g daily, and fish oil 4g daily) 06/22/16: TC 183, TG 333, HDL 12, LDL 104, total CK normal at 81 (atorvastatin 80mg , fenofibrate 105mg , and niacin 2g daily) 12/2015: TC 215, TG 572,HDL 8, LDL not calculable, Hgb A1c 5.2% (atorvastatin 80mg , fenofibrate 105mg , and niacin 2g daily) 08/2015: TC 199, TG 497, HDL 12, LDL not calculable  Past Medical History:  Diagnosis Date  . Depression   . Hyperlipidemia   . Hypertension   . Pancreatitis   . Tobacco abuse     Current Outpatient Medications on File Prior to Visit  Medication Sig Dispense Refill  . ALPRAZolam (XANAX) 1 MG tablet TAKE 1 TABLET BY MOUTH 3 TIMES A DAY AS NEEDED FOR  ANXIETY 90 tablet 1  . amLODipine (NORVASC) 10 MG tablet Take 1 tablet (10 mg total) by mouth daily. 90 tablet 3  . atenolol (TENORMIN) 100 MG tablet Take 1 tablet (100 mg total) by mouth daily. Must keep schedule appt w/new provider for future refills 90 tablet 3  . atorvastatin (LIPITOR) 80 MG tablet Take 1 tablet (80 mg total) by mouth daily. Must keep schedule appt w/new provider for future refills 90 tablet 3  . Bisacodyl (DULCOLAX PO) Take by mouth. 10mg  x 2 for colonoscopy prep    . Coenzyme Q10 60 MG CAPS Take 2 capsules by mouth daily.    . cyclobenzaprine (FLEXERIL) 5 MG tablet Take 1 tablet (5 mg total) by mouth 3 (three) times daily as needed for muscle spasms. 60 tablet 2  . fenofibrate  160 MG tablet Take 1 tablet (160 mg total) by mouth daily. 90 tablet 3  . ibuprofen (ADVIL,MOTRIN) 800 MG tablet TAKE 1 TABLET BY MOUTH EVERY 8 HOURS AS NEEDED 270 tablet 1  . ketoconazole (NIZORAL) 2 % cream Apply topically daily. 30 g 6  . Magnesium 200 MG TABS Take 1 tablet by mouth.    . NON FORMULARY Full Spectrum Mineral Caps    . omeprazole (PRILOSEC) 40 MG capsule Take 1 capsule (40 mg total) by mouth daily. 90 capsule 3  . OVER THE COUNTER MEDICATION Minerals for "bad cramps" strengthens bones and joints and stuff     No current facility-administered medications on file prior to visit.     Allergies  Allergen Reactions  . Sular Swelling    Assessment/Plan:  1. Hypertriglyceridemia: TGs above goal of < 150 mg/dL although have been improving over the past 6 months. Patient's lipid medications are optimized including atorvastatin 80mg  daily, fenofibrate 160mg  daily, fish oil 4g daily, and niacin 2g daily. Pt has started exercising 3 days a week and has made improvements in his diet including switching to diet soda and stopping drinking fruit juice. Biggest barrier to lowering TG remains beer intake. Pt has been averaging 20 beers per week. Set a goal today to reduce weekly intake to no more than 2 beers per day. Will f/u with lipid panel and in lipid clinic in 6 months.   Megan E. Supple, PharmD, CPP, BCACP [facility] [address], [place], [place] [address] Phone: [phone]; Fax: [phone] 01/09/2018 1:19 PM

## 2018-01-10 ENCOUNTER — Ambulatory Visit (INDEPENDENT_AMBULATORY_CARE_PROVIDER_SITE_OTHER): Payer: Commercial Managed Care - PPO | Admitting: Pharmacist

## 2018-01-10 DIAGNOSIS — E781 Pure hyperglyceridemia: Secondary | ICD-10-CM | POA: Diagnosis not present

## 2018-01-10 NOTE — Patient Instructions (Addendum)
Look for whole grain or whole wheat bread with fiber  Try to cut back on your beer intake to 14 beers per week  Continue taking your atorvastatin, fenofibrate, niacin, and fish oil for your cholesterol  Recheck fasting lab work in 6 months with office visit after to discuss results  Lab: Tuesday, September 24th any time after 7:30am  Office visit: Thursday, September 26th at 8:30am

## 2018-01-23 ENCOUNTER — Other Ambulatory Visit: Payer: Self-pay | Admitting: Internal Medicine

## 2018-02-26 ENCOUNTER — Other Ambulatory Visit: Payer: Self-pay | Admitting: Family

## 2018-03-02 ENCOUNTER — Other Ambulatory Visit: Payer: Self-pay | Admitting: Family

## 2018-03-15 ENCOUNTER — Other Ambulatory Visit: Payer: Self-pay | Admitting: Family

## 2018-04-08 ENCOUNTER — Other Ambulatory Visit: Payer: Self-pay | Admitting: Internal Medicine

## 2018-04-08 NOTE — Telephone Encounter (Signed)
Done erx 

## 2018-05-26 ENCOUNTER — Other Ambulatory Visit: Payer: Self-pay | Admitting: Internal Medicine

## 2018-05-27 NOTE — Telephone Encounter (Signed)
Done erx 

## 2018-07-09 ENCOUNTER — Other Ambulatory Visit: Payer: Commercial Managed Care - PPO

## 2018-07-09 DIAGNOSIS — E781 Pure hyperglyceridemia: Secondary | ICD-10-CM

## 2018-07-09 LAB — LIPID PANEL
Chol/HDL Ratio: 15.5 ratio — ABNORMAL HIGH (ref 0.0–5.0)
Cholesterol, Total: 186 mg/dL (ref 100–199)
HDL: 12 mg/dL — ABNORMAL LOW (ref >39)
Triglycerides: 422 mg/dL — ABNORMAL HIGH (ref 0–149)

## 2018-07-09 LAB — LDL CHOLESTEROL, DIRECT: LDL DIRECT: 110 mg/dL — AB (ref 0–99)

## 2018-07-09 LAB — HEPATIC FUNCTION PANEL
ALBUMIN: 4.4 g/dL (ref 3.5–5.5)
ALK PHOS: 41 IU/L (ref 39–117)
ALT: 21 IU/L (ref 0–44)
AST: 37 IU/L (ref 0–40)
BILIRUBIN TOTAL: 0.7 mg/dL (ref 0.0–1.2)
BILIRUBIN, DIRECT: 0.2 mg/dL (ref 0.00–0.40)
TOTAL PROTEIN: 7.2 g/dL (ref 6.0–8.5)

## 2018-07-11 ENCOUNTER — Ambulatory Visit (INDEPENDENT_AMBULATORY_CARE_PROVIDER_SITE_OTHER): Payer: Commercial Managed Care - PPO | Admitting: Pharmacist

## 2018-07-11 DIAGNOSIS — E781 Pure hyperglyceridemia: Secondary | ICD-10-CM

## 2018-07-11 MED ORDER — ICOSAPENT ETHYL 1 G PO CAPS: 2.0000 g | ORAL_CAPSULE | Freq: Two times a day (BID) | ORAL | 3 refills | Status: DC

## 2018-07-11 NOTE — Progress Notes (Signed)
Patient ID: Tanner Gomez                 DOB: 17-Sep-1966                    MRN: [id number]     HPI: Tanner Gomez is a 52 y.o. male patient referred to lipid clinic by Dr. Mayford Knife. PMH is significant for HTN, HLD, and tobacco abuse. He has previously been seen in the lipid clinic for management of hypertriglyceridemia. He has had pancreatitis 3 times in the past 10 years with a history of TGs >1000. At his last lipid visit, pt was optimized on TG lowering therapies and was encouraged to decrease his beer intake to 14 per week and look for whole grain instead of white carbs. He presents today for follow up.  Pt presents today in good spirits. He has been adherent to his medications, however reports that he has not been able to work out at all in the past few months due to a busy work schedule. He was previously going to the gym 3 days a week. He has been going out to lunch more since he has been on the road for work compared to previously bringing sandwiches on whole wheat bread to work for lunch. He still tries to eat healthy including grilled chicken options. He is drinking a lot of water and avoiding sugary drinks. Beer intake continues to remain higher than recommended - he drank 10 beers while watching football the evening before having labs drawn. Reports that some week days he will not drink any beer, and other days will have 3-4. Higher beer intake on the weekends. Previously averaged ~20 beers per week, does not feel like he has cut down significantly on alcohol intake since our last visit.  Current Medications: atorvastatin 80mg , fenofibrate 160mg , niacin 2g daily, and fish oil 2g BID  Risk Factors: History of pancreatitis x3  TG goal: 150mg /dL  Diet: 1 chicken or [place] sandwich for lunch with fresh fruit, and chicken breast and broccoli frozen dinners [employer]).  When he eats out he is choosing grilled chicken and greens.   Exercise: Pt has started going to [employer] 3 days per  week - speed walking on the treadmill on incline and bicycle. Walks his dog for exercise.   Family History: Mother with kidney disease, HTN, heart disease, and heart attack. Father with DM and HTN. Maternal grandmother and paternal grandfather with heart attack.  Social History: Smoking 1 PPD. Drinks 4-6beers per night. Denies illicit drug use.  Labs: 07/09/18: TC 186, TG 422, HDL 12, LDL-D 110 (atorvastatin 80mg  daily, fenofibrate 160mg  daily, niacin 2g daily, fish oil 2g bid) 01/08/18: TC 143, TG 356, HDL 22, LDL-D 73 (atorvastatin 80mg  daily, fenofibrate 160mg  daily, niacin 2g daily, fish oil 2g bid) 07/10/17: TC 215, TG 622, HDL 8, LDL not calculable (atorvastatin 80mg , fenofibrate 105mg , niacin 2g daily, and fish oil 2g bid) 06/15/17: TC 171, TG 287, HDL 23, LDL-d 95 04/10/17: TC 168, TG 445, HDL 15, LDL not calculable (atorvastatin 80mg , fenofibrate 105mg , niacin 2g daily, and fish oil 2g bid) 01/04/17: TC 214, TG 688, HDL 8, LDL-d 98 (atorvastatin 80mg , fenofibrate 105mg , niacin 2g daily, and fish oil 4g daily) 06/22/16: TC 183, TG 333, HDL 12, LDL 104, total CK normal at 81 (atorvastatin 80mg , fenofibrate 105mg , and niacin 2g daily) 12/2015: TC 215, TG 572,HDL 8, LDL not calculable, Hgb A1c 5.2% (atorvastatin 80mg , fenofibrate 105mg , and niacin  2g daily) 08/2015: TC 199, TG 497, HDL 12, LDL not calculable  Past Medical History:  Diagnosis Date  . Depression   . Hyperlipidemia   . Hypertension   . Pancreatitis   . Tobacco abuse     Current Outpatient Medications on File Prior to Visit  Medication Sig Dispense Refill  . ALPRAZolam (XANAX) 1 MG tablet TAKE 1 TABLET BY MOUTH THREE TIMES A DAY AS NEEDED FOR ANXIETY. NEED OFFICE VISIT 90 tablet 2  . amLODipine (NORVASC) 10 MG tablet Take 1 tablet (10 mg total) by mouth daily. 90 tablet 3  . atenolol (TENORMIN) 100 MG tablet Take 1 tablet (100 mg total) by mouth daily. Must keep schedule appt w/new provider for future refills 90 tablet 3  .  atorvastatin (LIPITOR) 80 MG tablet Take 1 tablet (80 mg total) by mouth daily. Must keep schedule appt w/new provider for future refills 90 tablet 3  . Bisacodyl (DULCOLAX PO) Take by mouth. 10mg  x 2 for colonoscopy prep    . Coenzyme Q10 60 MG CAPS Take 2 capsules by mouth daily.    . cyclobenzaprine (FLEXERIL) 5 MG tablet Take 1 tablet (5 mg total) by mouth 3 (three) times daily as needed for muscle spasms. 60 tablet 2  . fenofibrate 160 MG tablet Take 1 tablet (160 mg total) by mouth daily. 90 tablet 3  . ibuprofen (ADVIL,MOTRIN) 800 MG tablet TAKE 1 TABLET BY MOUTH EVERY 8 HOURS AS NEEDED 270 tablet 1  . ketoconazole (NIZORAL) 2 % cream Apply topically daily. 30 g 6  . Magnesium 200 MG TABS Take 1 tablet by mouth.    . niacin (NIASPAN) 1000 MG CR tablet TAKE 2 TABLETS DAILY AS DIRECTED 60 tablet 5  . NON FORMULARY Full Spectrum Mineral Caps    . omeprazole (PRILOSEC) 40 MG capsule Take 1 capsule (40 mg total) by mouth daily. 90 capsule 3  . OVER THE COUNTER MEDICATION Minerals for "bad cramps" strengthens bones and joints and stuff     No current facility-administered medications on file prior to visit.     Allergies  Allergen Reactions  . Sular Swelling    Assessment/Plan:  1. Hypertriglyceridemia: TGs above goal of < 150 mg/dL and have increased since last visit 6 months ago secondary to alcohol intake and inactivity. Encouraged pt to resume exercise 3 days a week if able. Again discussed decreasing beer intake and trying to limit weekly intake to no more than 2 beers per day or 14 per week. Will switch OTC fish oil to Vascepa 2g BID and continue other optimally dosed lipid meds including atorvastatin 80mg  daily, fenofibrate 160mg  daily, and niacin 2g daily. Pt qualifies for Vascepa copay card. Will f/u with lipid panel and in lipid clinic in 6 months.   Tanner Gomez, PharmD, CPP, BCACP [facility] [address], [place], [place] [address] Phone: [phone]; Fax: [phone] 07/11/2018 8:20 AM

## 2018-07-11 NOTE — Patient Instructions (Addendum)
It was nice to see you today  Your triglycerides are 422 - your goal is less than 150  We will switch your fish oil to a prescription version that is more pure called Vascepa. Take 2 grams twice a day (or 4 grams at once if this is easier with your schedule)  Try to limit your beer to less than 14 beers per week  Increase activity and start going back to the gym  Follow up in clinic in 6 months for lab work and office visit.   Fasting lab work on Wednesday, March 25th any time after 7:30am  Follow up office visit on Thursday, March 26th at 8:30am

## 2018-07-31 ENCOUNTER — Other Ambulatory Visit: Payer: Self-pay | Admitting: Internal Medicine

## 2018-10-21 ENCOUNTER — Other Ambulatory Visit: Payer: Self-pay | Admitting: Internal Medicine

## 2018-10-21 NOTE — Telephone Encounter (Signed)
   LOV:12/12/17 NextOV:11/27/18 Last Filled/Quantity:09/02/18 90#

## 2018-10-21 NOTE — Telephone Encounter (Signed)
Done erx 

## 2018-11-27 ENCOUNTER — Ambulatory Visit (INDEPENDENT_AMBULATORY_CARE_PROVIDER_SITE_OTHER): Payer: Commercial Managed Care - PPO | Admitting: Internal Medicine

## 2018-11-27 ENCOUNTER — Other Ambulatory Visit (INDEPENDENT_AMBULATORY_CARE_PROVIDER_SITE_OTHER): Payer: Commercial Managed Care - PPO

## 2018-11-27 ENCOUNTER — Encounter: Payer: Self-pay | Admitting: Internal Medicine

## 2018-11-27 VITALS — BP 118/82 | HR 70 | Temp 97.9°F | Ht 67.5 in | Wt 217.0 lb

## 2018-11-27 DIAGNOSIS — R739 Hyperglycemia, unspecified: Secondary | ICD-10-CM

## 2018-11-27 DIAGNOSIS — Z0000 Encounter for general adult medical examination without abnormal findings: Secondary | ICD-10-CM

## 2018-11-27 DIAGNOSIS — Z125 Encounter for screening for malignant neoplasm of prostate: Secondary | ICD-10-CM | POA: Diagnosis not present

## 2018-11-27 DIAGNOSIS — I10 Essential (primary) hypertension: Secondary | ICD-10-CM | POA: Diagnosis not present

## 2018-11-27 DIAGNOSIS — Z23 Encounter for immunization: Secondary | ICD-10-CM

## 2018-11-27 LAB — HEPATIC FUNCTION PANEL
ALT: 22 U/L (ref 0–53)
AST: 29 U/L (ref 0–37)
Albumin: 4.6 g/dL (ref 3.5–5.2)
Alkaline Phosphatase: 38 U/L — ABNORMAL LOW (ref 39–117)
BILIRUBIN DIRECT: 0.1 mg/dL (ref 0.0–0.3)
BILIRUBIN TOTAL: 0.6 mg/dL (ref 0.2–1.2)
Total Protein: 7.8 g/dL (ref 6.0–8.3)

## 2018-11-27 LAB — LIPID PANEL
Cholesterol: 166 mg/dL (ref 0–200)
HDL: 21.4 mg/dL — ABNORMAL LOW (ref >39.00)
NONHDL: 144.84
Total CHOL/HDL Ratio: 8
Triglycerides: 334 mg/dL — ABNORMAL HIGH (ref 0.0–149.0)
VLDL: 66.8 mg/dL — ABNORMAL HIGH (ref 0.0–40.0)

## 2018-11-27 LAB — URINALYSIS, ROUTINE W REFLEX MICROSCOPIC
Bilirubin Urine: NEGATIVE
Hgb urine dipstick: NEGATIVE
Ketones, ur: NEGATIVE
Leukocytes,Ua: NEGATIVE
Nitrite: NEGATIVE
RBC / HPF: NONE SEEN (ref 0–?)
Specific Gravity, Urine: <=1.005 — AB (ref 1.000–1.030)
Total Protein, Urine: NEGATIVE
Urine Glucose: NEGATIVE
Urobilinogen, UA: 0.2 (ref 0.0–1.0)
WBC, UA: NONE SEEN (ref 0–?)
pH: 7.5 (ref 5.0–8.0)

## 2018-11-27 LAB — CBC WITH DIFFERENTIAL/PLATELET
Basophils Absolute: 0.1 10*3/uL (ref 0.0–0.1)
Basophils Relative: 1 % (ref 0.0–3.0)
Eosinophils Absolute: 0.4 10*3/uL (ref 0.0–0.7)
Eosinophils Relative: 3.6 % (ref 0.0–5.0)
HCT: 42.9 % (ref 39.0–52.0)
Hemoglobin: 14.6 g/dL (ref 13.0–17.0)
Lymphocytes Relative: 24.5 % (ref 12.0–46.0)
Lymphs Abs: 2.7 10*3/uL (ref 0.7–4.0)
MCHC: 33.9 g/dL (ref 30.0–36.0)
MCV: 94.1 fl (ref 78.0–100.0)
MONOS PCT: 9.8 % (ref 3.0–12.0)
Monocytes Absolute: 1.1 10*3/uL — ABNORMAL HIGH (ref 0.1–1.0)
NEUTROS ABS: 6.6 10*3/uL (ref 1.4–7.7)
Neutrophils Relative %: 61.1 % (ref 43.0–77.0)
Platelets: 282 10*3/uL (ref 150.0–400.0)
RBC: 4.56 Mil/uL (ref 4.22–5.81)
RDW: 13.3 % (ref 11.5–15.5)
WBC: 10.8 10*3/uL — ABNORMAL HIGH (ref 4.0–10.5)

## 2018-11-27 LAB — TSH: TSH: 3.61 u[IU]/mL (ref 0.35–4.50)

## 2018-11-27 LAB — BASIC METABOLIC PANEL
BUN: 9 mg/dL (ref 6–23)
CO2: 28 meq/L (ref 19–32)
Calcium: 9.9 mg/dL (ref 8.4–10.5)
Chloride: 99 mEq/L (ref 96–112)
Creatinine, Ser: 0.92 mg/dL (ref 0.40–1.50)
GFR: 85.97 mL/min (ref >60.00)
GLUCOSE: 95 mg/dL (ref 70–99)
Potassium: 3.9 mEq/L (ref 3.5–5.1)
Sodium: 135 mEq/L (ref 135–145)

## 2018-11-27 LAB — PSA: PSA: 0.13 ng/mL (ref 0.10–4.00)

## 2018-11-27 LAB — LDL CHOLESTEROL, DIRECT: Direct LDL: 105 mg/dL

## 2018-11-27 LAB — HEMOGLOBIN A1C: HEMOGLOBIN A1C: 5.2 % (ref 4.6–6.5)

## 2018-11-27 NOTE — Assessment & Plan Note (Signed)
stable overall by history and exam, recent data reviewed with pt, and pt to continue medical treatment as before,  to f/u any worsening symptoms or concerns  

## 2018-11-27 NOTE — Patient Instructions (Signed)
You had Tdap tetanus shot and flu shot today  Please continue all other medications as before, and refills have been done if requested.  Please have the pharmacy call with any other refills you may need.  Please continue your efforts at being more active, low cholesterol diet, and weight control.  You are otherwise up to date with prevention measures today.  Please keep your appointments with your specialists as you may have planned  Please go to the LAB in the Basement (turn left off the elevator) for the tests to be done today  You will be contacted by phone if any changes need to be made immediately.  Otherwise, you will receive a letter about your results with an explanation, but please check with MyChart first.  Please remember to sign up for MyChart if you have not done so, as this will be important to you in the future with finding out test results, communicating by private email, and scheduling acute appointments online when needed.  Please return in 1 year for your yearly visit, or sooner if needed, with Lab testing done 3-5 days before

## 2018-11-27 NOTE — Progress Notes (Signed)
Subjective:    Patient ID: Tanner Gomez, male    DOB: 21-Apr-1966, 53 y.o.   MRN: [id number]  HPI  Here for wellness and f/u;  Overall doing ok;  Pt denies Chest pain, worsening SOB, DOE, wheezing, orthopnea, PND, worsening LE edema, palpitations, dizziness or syncope.  Pt denies neurological change such as new headache, facial or extremity weakness.  Pt denies polydipsia, polyuria, or low sugar symptoms. Pt states overall good compliance with treatment and medications, good tolerability, and has been trying to follow appropriate diet.  Pt denies worsening depressive symptoms, suicidal ideation or panic. No fever, night sweats, wt loss, loss of appetite, or other constitutional symptoms.  Pt states good ability with ADL's, has low fall risk, home safety reviewed and adequate, no other significant changes in hearing or vision, and only occasionally active with exercise.  Getting over a recent lost relationship with girlfriend.No new complaints Past Medical History:  Diagnosis Date  . Depression   . Hyperlipidemia   . Hypertension   . Pancreatitis   . Tobacco abuse    Past Surgical History:  Procedure Laterality Date  . EYE SURGERY     RIGHT (CHILDHOOD)  . JOINT REPLACEMENT      reports that he has been smoking cigarettes. He has a 37.00 pack-year smoking history. He has never used smokeless tobacco. He reports current alcohol use of about 33.0 standard drinks of alcohol per week. He reports that he does not use drugs. family history includes Diabetes in his father; Heart attack in his maternal grandmother, mother, and paternal grandfather; Heart disease in his mother; Hypertension in his father and mother; Kidney Stones in his sister; Kidney disease in his mother. Allergies  Allergen Reactions  . Sular Swelling   Current Outpatient Medications on File Prior to Visit  Medication Sig Dispense Refill  . ALPRAZolam (XANAX) 1 MG tablet TAKE 1 TABLET BY MOUTH THREE TIMES A DAY AS NEEDED FOR  ANXIETY. NEED OFFICE VISIT 90 tablet 2  . amLODipine (NORVASC) 10 MG tablet Take 1 tablet (10 mg total) by mouth daily. 90 tablet 3  . atenolol (TENORMIN) 100 MG tablet Take 1 tablet (100 mg total) by mouth daily. Must keep schedule appt w/new provider for future refills 90 tablet 3  . atorvastatin (LIPITOR) 80 MG tablet Take 1 tablet (80 mg total) by mouth daily. Must keep schedule appt w/new provider for future refills 90 tablet 3  . Bisacodyl (DULCOLAX PO) Take by mouth. 10mg  x 2 for colonoscopy prep    . Coenzyme Q10 60 MG CAPS Take 2 capsules by mouth daily.    . cyclobenzaprine (FLEXERIL) 5 MG tablet Take 1 tablet (5 mg total) by mouth 3 (three) times daily as needed for muscle spasms. 60 tablet 2  . fenofibrate 160 MG tablet Take 1 tablet (160 mg total) by mouth daily. 90 tablet 3  . ibuprofen (ADVIL,MOTRIN) 800 MG tablet TAKE 1 TABLET BY MOUTH EVERY 8 HOURS AS NEEDED 270 tablet 1  . Icosapent Ethyl (VASCEPA) 1 g CAPS Take 2 capsules (2 g total) by mouth 2 (two) times daily. 360 capsule 3  . ketoconazole (NIZORAL) 2 % cream Apply topically daily. 30 g 6  . Magnesium 200 MG TABS Take 1 tablet by mouth.    . niacin (NIASPAN) 1000 MG CR tablet TAKE 2 TABLETS DAILY AS DIRECTED 60 tablet 5  . NON FORMULARY Full Spectrum Mineral Caps    . omeprazole (PRILOSEC) 40 MG capsule Take 1 capsule (40  mg total) by mouth daily. 90 capsule 3  . OVER THE COUNTER MEDICATION Minerals for "bad cramps" strengthens bones and joints and stuff     No current facility-administered medications on file prior to visit.    Review of Systems Constitutional: Negative for other unusual diaphoresis, sweats, appetite or weight changes HENT: Negative for other worsening hearing loss, ear pain, facial swelling, mouth sores or neck stiffness.   Eyes: Negative for other worsening pain, redness or other visual disturbance.  Respiratory: Negative for other stridor or swelling Cardiovascular: Negative for other palpitations or  other chest pain  Gastrointestinal: Negative for worsening diarrhea or loose stools, blood in stool, distention or other pain Genitourinary: Negative for hematuria, flank pain or other change in urine volume.  Musculoskeletal: Negative for myalgias or other joint swelling.  Skin: Negative for other color change, or other wound or worsening drainage.  Neurological: Negative for other syncope or numbness. Hematological: Negative for other adenopathy or swelling Psychiatric/Behavioral: Negative for hallucinations, other worsening agitation, SI, self-injury, or new decreased concentration All other system neg per pt    Objective:   Physical Exam BP 118/82   Pulse 70   Temp 97.9 F (36.6 C) (Oral)   Ht 5' 7.5" (1.715 m)   Wt 217 lb (98.4 kg)   SpO2 96%   BMI 33.49 kg/m  VS noted,  Constitutional: Pt is oriented to person, place, and time. Appears well-developed and well-nourished, in no significant distress and comfortable Head: Normocephalic and atraumatic  Eyes: Conjunctivae and EOM are normal. Pupils are equal, round, and reactive to light Right Ear: External ear normal without discharge Left Ear: External ear normal without discharge Nose: Nose without discharge or deformity Mouth/Throat: Oropharynx is without other ulcerations and moist  Neck: Normal range of motion. Neck supple. No JVD present. No tracheal deviation present or significant neck LA or mass Cardiovascular: Normal rate, regular rhythm, normal heart sounds and intact distal pulses.   Pulmonary/Chest: WOB normal and breath sounds without rales or wheezing  Abdominal: Soft. Bowel sounds are normal. NT. No HSM  Musculoskeletal: Normal range of motion. Exhibits no edema Lymphadenopathy: Has no other cervical adenopathy.  Neurological: Pt is alert and oriented to person, place, and time. Pt has normal reflexes. No cranial nerve deficit. Motor grossly intact, Gait intact Skin: Skin is warm and dry. No rash noted or new  ulcerations Psychiatric:  Has normal mood and affect. Behavior is normal without agitation No other exam findings Lab Results  Component Value Date   WBC 10.8 (H) 11/27/2018   HGB 14.6 11/27/2018   HCT 42.9 11/27/2018   PLT 282.0 11/27/2018   GLUCOSE 95 11/27/2018   CHOL 166 11/27/2018   TRIG 334.0 (H) 11/27/2018   HDL 21.40 (L) 11/27/2018   LDLDIRECT 105.0 11/27/2018   LDLCALC Comment 07/09/2018   ALT 22 11/27/2018   AST 29 11/27/2018   NA 135 11/27/2018   K 3.9 11/27/2018   CL 99 11/27/2018   CREATININE 0.92 11/27/2018   BUN 9 11/27/2018   CO2 28 11/27/2018   TSH 3.61 11/27/2018   PSA 0.13 11/27/2018   INR 1.61 (H) 03/07/2011   HGBA1C 5.2 11/27/2018        Assessment & Plan:

## 2018-11-27 NOTE — Assessment & Plan Note (Signed)
Also for a1c with labs 

## 2018-11-27 NOTE — Assessment & Plan Note (Signed)

## 2018-12-06 ENCOUNTER — Other Ambulatory Visit: Payer: Self-pay | Admitting: Internal Medicine

## 2018-12-21 ENCOUNTER — Other Ambulatory Visit: Payer: Self-pay | Admitting: Internal Medicine

## 2018-12-21 DIAGNOSIS — K219 Gastro-esophageal reflux disease without esophagitis: Secondary | ICD-10-CM

## 2018-12-22 ENCOUNTER — Other Ambulatory Visit: Payer: Self-pay | Admitting: Internal Medicine

## 2018-12-22 DIAGNOSIS — I10 Essential (primary) hypertension: Secondary | ICD-10-CM

## 2019-01-04 ENCOUNTER — Other Ambulatory Visit: Payer: Self-pay | Admitting: Internal Medicine

## 2019-01-04 DIAGNOSIS — I10 Essential (primary) hypertension: Secondary | ICD-10-CM

## 2019-01-07 ENCOUNTER — Other Ambulatory Visit: Payer: Self-pay | Admitting: Internal Medicine

## 2019-01-08 ENCOUNTER — Other Ambulatory Visit: Payer: Commercial Managed Care - PPO

## 2019-01-09 ENCOUNTER — Ambulatory Visit: Payer: Commercial Managed Care - PPO

## 2019-01-31 ENCOUNTER — Other Ambulatory Visit: Payer: Commercial Managed Care - PPO

## 2019-02-12 ENCOUNTER — Ambulatory Visit: Payer: Commercial Managed Care - PPO

## 2019-02-19 ENCOUNTER — Other Ambulatory Visit: Payer: Self-pay | Admitting: Internal Medicine

## 2019-03-20 ENCOUNTER — Other Ambulatory Visit: Payer: Self-pay | Admitting: Internal Medicine

## 2019-03-20 NOTE — Telephone Encounter (Signed)
Done erx 

## 2019-04-07 ENCOUNTER — Telehealth: Payer: Self-pay | Admitting: *Deleted

## 2019-04-07 NOTE — Telephone Encounter (Signed)
    COVID-19 Pre-Screening Questions:  . In the past 7 to 10 days have you had a cough,  shortness of breath, headache, congestion, fever (100 or greater) body aches, chills, sore throat, or sudden loss of taste or sense of smell? . Have you been around anyone with known Covid 19. . Have you been around anyone who is awaiting Covid 19 test results in the past 7 to 10 days? . Have you been around anyone who has been exposed to Covid 19, or has mentioned symptoms of Covid 19 within the past 7 to 10 days?  If you have any concerns/questions about symptoms patients report during screening (either on the phone or at threshold). Contact the provider seeing the patient or DOD for further guidance.  If neither are available contact a member of the leadership team.            Contacted Pt via phone call . Answered all Covid 19 questions no. Has a mask.KB  

## 2019-04-08 ENCOUNTER — Other Ambulatory Visit: Payer: Self-pay

## 2019-04-08 ENCOUNTER — Other Ambulatory Visit: Payer: Commercial Managed Care - PPO

## 2019-04-08 DIAGNOSIS — E781 Pure hyperglyceridemia: Secondary | ICD-10-CM

## 2019-04-08 LAB — HEPATIC FUNCTION PANEL
ALT: 17 IU/L (ref 0–44)
AST: 25 IU/L (ref 0–40)
Albumin: 4.3 g/dL (ref 3.8–4.9)
Alkaline Phosphatase: 39 IU/L (ref 39–117)
Bilirubin Total: 0.5 mg/dL (ref 0.0–1.2)
Bilirubin, Direct: 0.14 mg/dL (ref 0.00–0.40)
Total Protein: 6.7 g/dL (ref 6.0–8.5)

## 2019-04-08 LAB — LDL CHOLESTEROL, DIRECT: LDL Direct: 94 mg/dL (ref 0–99)

## 2019-04-08 LAB — LIPID PANEL
Chol/HDL Ratio: 11.6 ratio — ABNORMAL HIGH (ref 0.0–5.0)
Cholesterol, Total: 163 mg/dL (ref 100–199)
HDL: 14 mg/dL — ABNORMAL LOW (ref >39)
LDL Calculated: 69 mg/dL (ref 0–99)
Triglycerides: 398 mg/dL — ABNORMAL HIGH (ref 0–149)
VLDL Cholesterol Cal: 80 mg/dL — ABNORMAL HIGH (ref 5–40)

## 2019-04-11 ENCOUNTER — Telehealth: Payer: Self-pay

## 2019-04-11 DIAGNOSIS — E785 Hyperlipidemia, unspecified: Secondary | ICD-10-CM

## 2019-04-11 NOTE — Telephone Encounter (Signed)
Spoke with the patient, he accepted the referral to Dr. Debara Pickett. The patient stated he was never able to pick up Vascepa because it was too expensive.

## 2019-04-11 NOTE — Telephone Encounter (Signed)
-----   Message from Sueanne Margarita, MD sent at 04/08/2019  4:44 PM EDT ----- Lipids still not at goal. Refer to Dr. Debara Pickett for lipid consult

## 2019-06-11 ENCOUNTER — Encounter: Payer: Self-pay | Admitting: Internal Medicine

## 2019-06-11 ENCOUNTER — Ambulatory Visit: Payer: Commercial Managed Care - PPO | Admitting: Internal Medicine

## 2019-06-11 ENCOUNTER — Other Ambulatory Visit: Payer: Self-pay

## 2019-06-11 VITALS — BP 124/88 | HR 76 | Temp 98.4°F | Ht 67.5 in | Wt 219.6 lb

## 2019-06-11 DIAGNOSIS — Z8719 Personal history of other diseases of the digestive system: Secondary | ICD-10-CM | POA: Diagnosis not present

## 2019-06-11 DIAGNOSIS — F1020 Alcohol dependence, uncomplicated: Secondary | ICD-10-CM | POA: Diagnosis not present

## 2019-06-11 DIAGNOSIS — E782 Mixed hyperlipidemia: Secondary | ICD-10-CM | POA: Diagnosis not present

## 2019-06-11 MED ORDER — VASCEPA 1 G PO CAPS: 2.0000 g | ORAL_CAPSULE | Freq: Two times a day (BID) | ORAL | 11 refills | Status: DC

## 2019-06-11 NOTE — Patient Instructions (Addendum)
Medication Instructions:  START vascapa 2 gram by mouth twice daily = 4 capsules per day  If you need a refill on your cardiac medications before your next appointment, please call your pharmacy.   Lab work: FASTING lab work in 3 months to check cholesterol If you have labs (blood work) drawn today and your tests are completely normal, you will receive your results only by: Marland Kitchen MyChart Message (if you have MyChart) OR . A paper copy in the mail If you have any lab test that is abnormal or we need to change your treatment, we will call you to review the results.  Testing/Procedures: NONE  Follow-Up: Dr. Debara Pickett recommends that you schedule a follow up visit with him the in the [place] in 3 months. Please have fasting blood work about 1 week prior to this visit and he will review the blood work results with you at your appointment.

## 2019-06-11 NOTE — Progress Notes (Signed)
LIPID CLINIC CONSULT NOTE  Chief Complaint:  Hypertriglyceridemia  Primary Care Physician: [name], [name], MD  Primary Cardiologist:  No primary care provider on file.  HPI:  Tanner RyderLarry W Gomez is a 53 y.o. male who is being seen today for the evaluation of hypertriglyceridemia at the request of Turner, Traci R, MD.  Mr. Myrene BuddyHoke is a pleasant 53 year old male who has been followed by Dr. Mayford Knifeurner with a history of hypertension, dyslipidemia, alcohol and tobacco use, depression and pancreatitis in the setting of severe hypertriglyceridemia.  He is also been followed recently by Margaretmary DysMegan Supple, PharmD [facility].  He was most recently seen in September 2019, at the time it was recommended that he start Vascepa.  Repeat labs, however recently showed no significant change in his triglycerides and he was referred to me.  His total cholesterol was 163, triglycerides 398, HDL 14 and LDL of 69.  A direct LDL was 94.  He does report compliance with the following lipid medications: Atorvastatin 80 mg daily, fenofibrate 160 mg daily and Niaspan 2000 mg daily.  Unfortunately he reports persistent alcohol use.  He says he drinks 5-6 beers a day and this actually represents a significant reduction in his alcohol intake.  We talked about this at great length and a does not seem that he is interested in necessarily stopping the alcohol but does understand that it is adversely affecting his triglycerides and increasing cardiovascular risk.  He seems to feel that since he can perform at his job and it does not seem to interfere with his lifestyle that it is not abnormal, but I would consider his alcohol use excessive.  The barrier to getting Vascepa was cost and it was greater than $200/month.  This may need to be revisited.  PMHx:  Past Medical History:  Diagnosis Date  . Depression   . Hyperlipidemia   . Hypertension   . Pancreatitis   . Tobacco abuse     Past Surgical History:  Procedure  Laterality Date  . EYE SURGERY     RIGHT (CHILDHOOD)  . JOINT REPLACEMENT      FAMHx:  Family History  Problem Relation Age of Onset  . Kidney disease Mother   . Hypertension Mother   . Heart disease Mother   . Heart attack Mother   . Diabetes Father   . Hypertension Father   . Kidney Stones Sister   . Heart attack Maternal Grandmother   . Heart attack Paternal Grandfather   . Colon cancer Neg Hx     SOCHx:   reports that he has been smoking cigarettes. He has a 37.00 pack-year smoking history. He has never used smokeless tobacco. He reports current alcohol use of about 33.0 standard drinks of alcohol per week. He reports that he does not use drugs.  ALLERGIES:  Allergies  Allergen Reactions  . Sular Swelling    ROS: Pertinent items noted in HPI and remainder of comprehensive ROS otherwise negative.  HOME MEDS: Current Outpatient Medications on File Prior to Visit  Medication Sig Dispense Refill  . ALPRAZolam (XANAX) 1 MG tablet TAKE 1 TABLET BY MOUTH THREE TIMES A DAY AS NEEDED FOR ANXIETY. NEED OFFICE VISIT 90 tablet 2  . amLODipine (NORVASC) 10 MG tablet TAKE 1 TABLET BY MOUTH EVERY DAY 30 tablet 11  . atenolol (TENORMIN) 100 MG tablet TAKE 1 TABLET BY MOUTH DAILY. MUST KEEP SCHEDULE APPT W/NEW PROVIDER FOR FUTURE REFILLS 90 tablet 3  .  atorvastatin (LIPITOR) 80 MG tablet TAKE 1 TABLET BY MOUTH DAILY. MUST KEEP SCHEDULE APPT W/NEW PROVIDER FOR FUTURE REFILLS 30 tablet 11  . Coenzyme Q10 60 MG CAPS Take 2 capsules by mouth daily.    . fenofibrate 160 MG tablet TAKE 1 TABLET BY MOUTH EVERY DAY 30 tablet 11  . ibuprofen (ADVIL,MOTRIN) 800 MG tablet TAKE 1 TABLET BY MOUTH EVERY 8 HOURS AS NEEDED 270 tablet 1  . Magnesium 200 MG TABS Take 1 tablet by mouth.    . niacin (NIASPAN) 1000 MG CR tablet TAKE 2 TABLETS DAILY AS DIRECTED 60 tablet 5  . NON FORMULARY Take 1 tablet by mouth daily. Full Spectrum Mineral Caps     . omeprazole (PRILOSEC) 40 MG capsule TAKE 1 CAPSULE BY  MOUTH EVERY DAY 30 capsule 11  . OVER THE COUNTER MEDICATION Take 1 tablet by mouth daily. Minerals for "bad cramps" strengthens bones and joints and stuff      No current facility-administered medications on file prior to visit.     LABS/IMAGING: No results found for this or any previous visit (from the past 48 hour(s)). No results found.  LIPID PANEL:    Component Value Date/Time   CHOL 163 04/08/2019 0829   TRIG 398 (H) 04/08/2019 0829   HDL 14 (L) 04/08/2019 0829   CHOLHDL 11.6 (H) 04/08/2019 0829   CHOLHDL 8 11/27/2018 1545   VLDL 66.8 (H) 11/27/2018 1545   LDLCALC 69 04/08/2019 0829   LDLDIRECT 94 04/08/2019 0829   LDLDIRECT 105.0 11/27/2018 1545    WEIGHTS: Wt Readings from Last 3 Encounters:  06/11/19 219 lb 9.6 oz (99.6 kg)  11/27/18 217 lb (98.4 kg)  12/12/17 201 lb (91.2 kg)    VITALS: BP 124/88   Pulse 76   Temp 98.4 F (36.9 C)   Ht 5' 7.5" (1.715 m)   Wt 219 lb 9.6 oz (99.6 kg)   SpO2 97%   BMI 33.89 kg/m   EXAM: General appearance: alert and no distress Neck: no carotid bruit, no JVD and thyroid not enlarged, symmetric, no tenderness/mass/nodules Lungs: clear to auscultation bilaterally Heart: regular rate and rhythm Abdomen: soft, non-tender; bowel sounds normal; no masses,  no organomegaly Extremities: extremities normal, atraumatic, no cyanosis or edema Pulses: 2+ and symmetric Skin: Skin color, texture, turgor normal. No rashes or lesions Neurologic: Grossly normal Psych: Pleasant  EKG: Deferred  ASSESSMENT: 1. Primary hypertriglyceridemia 2. History of pancreatitis secondary to #1 and alcohol use 3. Alcohol dependence  PLAN: 1.   Mr. Kettner has a persistent hypertriglyceridemia and history of pancreatitis with ongoing alcohol use.  Triglycerides were in the thousands in the past and have come down significantly on his current regimen.  I agree with the recommendation for Vascepa however he could not afford the medication when it was  prescribed last fall.  There may be some options for him which we will investigate.  More importantly, however is his ongoing alcohol use.  I tried underscore the importance of how significant it is for his health and he does seem that he is interested in being more healthy.  He is also had a decline in the health of his diet recently during the COVID pandemic where he was previously taking sandwiches from home for lunch she is now been eating more fast food and other less healthy choices.  We discussed healthier eating habits as well.  I challenged him to try to see if he could reduce his alcohol intake significantly over the  next 3 months and then repeat a lipid profile at that time to see if there is a positive effect on his triglycerides.  In the meantime will again try to pursue adding Vascepa to his regimen if his cost effective.  Thanks for the kind referral.  Pixie Casino, MD, FACC, [place] Director of the [facility]  [facility] Diplomate of the American Board of Clinical Lipidology Attending Cardiologist  Direct Dial: [phone]  Fax: [phone]  Website:  [email or web address] 06/11/2019, 12:38 PM

## 2019-08-14 ENCOUNTER — Other Ambulatory Visit: Payer: Self-pay | Admitting: Internal Medicine

## 2019-08-18 ENCOUNTER — Other Ambulatory Visit: Payer: Self-pay | Admitting: Internal Medicine

## 2019-08-18 NOTE — Telephone Encounter (Signed)
[place] Controlled Database Checked Last filled: 06/30/19 # 90 LOV w/you: 11/27/18 Next appt w/you: 12/01/19

## 2019-08-18 NOTE — Telephone Encounter (Signed)
Done erx 

## 2019-08-22 LAB — LIPID PANEL
Chol/HDL Ratio: 7.7 ratio — ABNORMAL HIGH (ref 0.0–5.0)
Cholesterol, Total: 154 mg/dL (ref 100–199)
HDL: 20 mg/dL — ABNORMAL LOW (ref >39)
LDL Chol Calc (NIH): 92 mg/dL (ref 0–99)
Triglycerides: 248 mg/dL — ABNORMAL HIGH (ref 0–149)
VLDL Cholesterol Cal: 42 mg/dL — ABNORMAL HIGH (ref 5–40)

## 2019-08-22 LAB — LDL CHOLESTEROL, DIRECT: LDL Direct: 91 mg/dL (ref 0–99)

## 2019-09-01 ENCOUNTER — Ambulatory Visit: Payer: Commercial Managed Care - PPO | Admitting: Internal Medicine

## 2019-09-01 ENCOUNTER — Encounter: Payer: Self-pay | Admitting: Internal Medicine

## 2019-09-01 ENCOUNTER — Other Ambulatory Visit: Payer: Self-pay

## 2019-09-01 VITALS — BP 131/83 | HR 72 | Temp 98.4°F | Ht 68.0 in | Wt 217.2 lb

## 2019-09-01 DIAGNOSIS — F1020 Alcohol dependence, uncomplicated: Secondary | ICD-10-CM

## 2019-09-01 DIAGNOSIS — Z8719 Personal history of other diseases of the digestive system: Secondary | ICD-10-CM

## 2019-09-01 DIAGNOSIS — E782 Mixed hyperlipidemia: Secondary | ICD-10-CM | POA: Diagnosis not present

## 2019-09-01 NOTE — Patient Instructions (Signed)
Medication Instructions:  Your physician recommends that you continue on your current medications as directed. Please refer to the Current Medication list given to you today.  *If you need a refill on your cardiac medications before your next appointment, please call your pharmacy*  Lab Work: FASTING lab work in 6 months - a lab order will be mailed If you have labs (blood work) drawn today and your tests are completely normal, you will receive your results only by: Marland Kitchen MyChart Message (if you have MyChart) OR . A paper copy in the mail If you have any lab test that is abnormal or we need to change your treatment, we will call you to review the results.  Testing/Procedures: NONE  Follow-Up: At [facility], you and your health needs are our priority.  As part of our continuing mission to provide you with exceptional heart care, we have created designated Provider Care Teams.  These Care Teams include your primary Cardiologist (physician) and Advanced Practice Providers (APPs -  Physician Assistants and Nurse Practitioners) who all work together to provide you with the care you need, when you need it.  Your next appointment:   6 months  The format for your next appointment:   Either In Person or Virtual  Provider:   Raliegh Ip Mali Hilty, MD - lipid clinic  Other Instructions

## 2019-09-01 NOTE — Progress Notes (Signed)
LIPID CLINIC CONSULT NOTE  Chief Complaint:  Follow-up hypertriglyceridemia  Primary Care Physician: Tanner LevinsJohn, [name], MD  Primary Cardiologist:  No primary care provider on file.  HPI:  Tanner Gomez is a 53 y.o. male who is being seen today for the evaluation of hypertriglyceridemia at the request of Tanner LevinsJohn, [name], MD.  Tanner Gomez is a pleasant 53 year old male who has been followed by Dr. Mayford Knifeurner with a history of hypertension, dyslipidemia, alcohol and tobacco use, depression and pancreatitis in the setting of severe hypertriglyceridemia.  He is also been followed recently by Margaretmary DysMegan Supple, PharmD in the [facility].  He was most recently seen in September 2019, at the time it was recommended that he start Vascepa.  Repeat labs, however recently showed no significant change in his triglycerides and he was referred to me.  His total cholesterol was 163, triglycerides 398, HDL 14 and LDL of 69.  A direct LDL was 94.  He does report compliance with the following lipid medications: Atorvastatin 80 mg daily, fenofibrate 160 mg daily and Niaspan 2000 mg daily.  Unfortunately he reports persistent alcohol use.  He says he drinks 5-6 beers a day and this actually represents a significant reduction in his alcohol intake.  We talked about this at great length and a does not seem that he is interested in necessarily stopping the alcohol but does understand that it is adversely affecting his triglycerides and increasing cardiovascular risk.  He seems to feel that since he can perform at his job and it does not seem to interfere with his lifestyle that it is not abnormal, but I would consider his alcohol use excessive.  The barrier to getting Vascepa was cost and it was greater than $200/month.  This may need to be revisited.  09/01/2019  Tanner Gomez returns today for follow-up of hypertriglyceridemia.  He has had significant improvement in his numbers.  He says he is been able to make significant  dietary changes.  He is cut back on fast foods and eating more healthy meals.  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  He also is working to try to start an exercise program at [employer].  He is triglycerides most recently were down to 248 (improved from 422 in the past).  He has had no further episodes of pancreatitis.  He is also reduced alcohol intake significantly.  His most recent lipid profile showed total cholesterol 154, triglycerides 248, HDL 20 LDL 92.  He is also on high potency statin and fenofibrate 160 mg daily.  PMHx:  Past Medical History:  Diagnosis Date  . Depression   . Hyperlipidemia   . Hypertension   . Pancreatitis   . Tobacco abuse     Past Surgical History:  Procedure Laterality Date  . EYE SURGERY     RIGHT (CHILDHOOD)  . JOINT REPLACEMENT      FAMHx:  Family History  Problem Relation Age of Onset  . Kidney disease Mother   . Hypertension Mother   . Heart disease Mother   . Heart attack Mother   . Diabetes Father   . Hypertension Father   . Kidney Stones Sister   . Heart attack Maternal Grandmother   . Heart attack Paternal Grandfather   . Colon cancer Neg Hx     SOCHx:   reports that he has been smoking cigarettes. He has a 37.00 pack-year smoking history. He has  never used smokeless tobacco. He reports current alcohol use of about 33.0 standard drinks of alcohol per week. He reports that he does not use drugs.  ALLERGIES:  Allergies  Allergen Reactions  . Sular Swelling    ROS: Pertinent items noted in HPI and remainder of comprehensive ROS otherwise negative.  HOME MEDS: Current Outpatient Medications on File Prior to Visit  Medication Sig Dispense Refill  . ALPRAZolam (XANAX) 1 MG tablet TAKE 1 TABLET BY MOUTH THREE TIMES A DAY AS NEEDED FOR ANXIETY 90 tablet 2  . amLODipine (NORVASC) 10 MG tablet TAKE 1 TABLET BY MOUTH EVERY DAY 30 tablet 11  . atenolol (TENORMIN) 100 MG tablet TAKE 1 TABLET BY  MOUTH DAILY. MUST KEEP SCHEDULE APPT Gomez/NEW PROVIDER FOR FUTURE REFILLS 90 tablet 3  . atorvastatin (LIPITOR) 80 MG tablet TAKE 1 TABLET BY MOUTH DAILY. MUST KEEP SCHEDULE APPT Gomez/NEW PROVIDER FOR FUTURE REFILLS 30 tablet 11  . Coenzyme Q10 60 MG CAPS Take 2 capsules by mouth daily.    . fenofibrate 160 MG tablet TAKE 1 TABLET BY MOUTH EVERY DAY 30 tablet 11  . ibuprofen (ADVIL,MOTRIN) 800 MG tablet TAKE 1 TABLET BY MOUTH EVERY 8 HOURS AS NEEDED 270 tablet 1  . Icosapent Ethyl (VASCEPA) 1 g CAPS Take 2 capsules (2 g total) by mouth 2 (two) times daily. 120 capsule 11  . Magnesium 200 MG TABS Take 1 tablet by mouth.    . niacin (NIASPAN) 1000 MG CR tablet TAKE 2 TABLETS BY MOUTH DAILY AS DIRECTED 60 tablet 5  . NON FORMULARY Take 1 tablet by mouth daily. Full Spectrum Mineral Caps     . omeprazole (PRILOSEC) 40 MG capsule TAKE 1 CAPSULE BY MOUTH EVERY DAY 30 capsule 11  . OVER THE COUNTER MEDICATION Take 1 tablet by mouth daily. Minerals for "bad cramps" strengthens bones and joints and stuff      No current facility-administered medications on file prior to visit.     LABS/IMAGING: No results found for this or any previous visit (from the past 48 hour(s)). No results found.  LIPID PANEL:    Component Value Date/Time   CHOL 154 08/21/2019 1023   TRIG 248 (H) 08/21/2019 1023   HDL 20 (L) 08/21/2019 1023   CHOLHDL 7.7 (H) 08/21/2019 1023   CHOLHDL 8 11/27/2018 1545   VLDL 66.8 (H) 11/27/2018 1545   LDLCALC 92 08/21/2019 1023   LDLDIRECT 91 08/21/2019 1023   LDLDIRECT 105.0 11/27/2018 1545    WEIGHTS: Wt Readings from Last 3 Encounters:  09/01/19 217 lb 3.2 oz (98.5 kg)  06/11/19 219 lb 9.6 oz (99.6 kg)  11/27/18 217 lb (98.4 kg)    VITALS: BP 131/83   Pulse 72   Temp 98.4 F (36.9 C)   Ht 5\' 8"  (1.727 m)   Wt 217 lb 3.2 oz (98.5 kg)   SpO2 98%   BMI 33.03 kg/m   EXAM: Deferred  EKG: Deferred  ASSESSMENT: 1. Primary hypertriglyceridemia 2. History of pancreatitis  secondary to #1 and alcohol use 3. Alcohol dependence  PLAN: 1.   [name] has had significant improvement in hypertriglyceridemia.  His triglycerides now 248 down from well over 400 in the past.  He is backed off on his alcohol use and is eating more healthy.  Overall he is doing well.  I encouraged more exercise and physical activity as well as some weight loss.  This should also help with his numbers.  No changes to his medicines today.  Plan follow-up 6 months or sooner as necessary.  Chrystie Nose, MD, [facility], FACP    [facility]  Medical Director of the [facility]  Cardiovascular Risk Reduction Clinic Diplomate of the American Board of Clinical Lipidology Attending Cardiologist  Direct Dial: [phone]  Fax: [phone]  Website:  [email or web address] Nicely Mertha Clyatt 09/01/2019, 9:03 AM

## 2019-10-10 ENCOUNTER — Other Ambulatory Visit: Payer: Self-pay | Admitting: Internal Medicine

## 2019-10-10 DIAGNOSIS — K219 Gastro-esophageal reflux disease without esophagitis: Secondary | ICD-10-CM

## 2019-10-10 DIAGNOSIS — I10 Essential (primary) hypertension: Secondary | ICD-10-CM

## 2019-10-12 NOTE — Telephone Encounter (Signed)
Per routine office policy  All routine meds ok for 1 yr, then month to month only for 3 mo until refused after, thanks 

## 2019-12-01 ENCOUNTER — Ambulatory Visit (INDEPENDENT_AMBULATORY_CARE_PROVIDER_SITE_OTHER): Payer: Commercial Managed Care - PPO | Admitting: Internal Medicine

## 2019-12-01 ENCOUNTER — Other Ambulatory Visit: Payer: Self-pay

## 2019-12-01 ENCOUNTER — Encounter: Payer: Self-pay | Admitting: Internal Medicine

## 2019-12-01 VITALS — BP 126/78 | HR 60 | Temp 98.2°F | Ht 69.0 in | Wt 220.0 lb

## 2019-12-01 DIAGNOSIS — G4710 Hypersomnia, unspecified: Secondary | ICD-10-CM | POA: Diagnosis not present

## 2019-12-01 DIAGNOSIS — D8989 Other specified disorders involving the immune mechanism, not elsewhere classified: Secondary | ICD-10-CM

## 2019-12-01 DIAGNOSIS — R739 Hyperglycemia, unspecified: Secondary | ICD-10-CM

## 2019-12-01 DIAGNOSIS — E611 Iron deficiency: Secondary | ICD-10-CM

## 2019-12-01 DIAGNOSIS — G4733 Obstructive sleep apnea (adult) (pediatric): Secondary | ICD-10-CM | POA: Insufficient documentation

## 2019-12-01 DIAGNOSIS — E559 Vitamin D deficiency, unspecified: Secondary | ICD-10-CM

## 2019-12-01 DIAGNOSIS — G4700 Insomnia, unspecified: Secondary | ICD-10-CM | POA: Insufficient documentation

## 2019-12-01 DIAGNOSIS — Z0000 Encounter for general adult medical examination without abnormal findings: Secondary | ICD-10-CM | POA: Diagnosis not present

## 2019-12-01 DIAGNOSIS — E538 Deficiency of other specified B group vitamins: Secondary | ICD-10-CM

## 2019-12-01 NOTE — Assessment & Plan Note (Signed)
stable overall by history and exam, recent data reviewed with pt, and pt to continue medical treatment as before,  to f/u any worsening symptoms or concerns  

## 2019-12-01 NOTE — Progress Notes (Signed)
Subjective:    Patient ID: Tanner Gomez, male    DOB: 08/28/1966, 54 y.o.   MRN: [id number]  HPI  Here for wellness and f/u;  Overall doing ok;  Pt denies Chest pain, worsening SOB, DOE, wheezing, orthopnea, PND, worsening LE edema, palpitations, dizziness or syncope.  Pt denies neurological change such as new headache, facial or extremity weakness.  Pt denies polydipsia, polyuria, or low sugar symptoms. Pt states overall good compliance with treatment and medications, good tolerability, and has been trying to follow appropriate diet.  Pt denies worsening depressive symptoms, suicidal ideation or panic. No fever, night sweats, wt loss, loss of appetite, or other constitutional symptoms.  Pt states good ability with ADL's, has low fall risk, home safety reviewed and adequate, no other significant changes in hearing or vision, and only occasionally active with exercise. Non new complaints except for daytime somnolence, difficulty sleeping and snoring at night, almost falls asleep with driving Past Medical History:  Diagnosis Date  . Depression   . Hyperlipidemia   . Hypertension   . Pancreatitis   . Tobacco abuse    Past Surgical History:  Procedure Laterality Date  . EYE SURGERY     RIGHT (CHILDHOOD)  . JOINT REPLACEMENT      reports that he has been smoking cigarettes. He has a 37.00 pack-year smoking history. He has never used smokeless tobacco. He reports current alcohol use of about 33.0 standard drinks of alcohol per week. He reports that he does not use drugs. family history includes Diabetes in his father; Heart attack in his maternal grandmother, mother, and paternal grandfather; Heart disease in his mother; Hypertension in his father and mother; Kidney Stones in his sister; Kidney disease in his mother. Allergies  Allergen Reactions  . Sular Swelling   Current Outpatient Medications on File Prior to Visit  Medication Sig Dispense Refill  . ALPRAZolam (XANAX) 1 MG tablet TAKE 1  TABLET BY MOUTH THREE TIMES A DAY AS NEEDED FOR ANXIETY 90 tablet 2  . amLODipine (NORVASC) 10 MG tablet Take 1 tablet (10 mg total) by mouth daily. Annual appt due in February must see provider for future refills 30 tablet 1  . atenolol (TENORMIN) 100 MG tablet TAKE 1 TABLET BY MOUTH DAILY. MUST KEEP SCHEDULE APPT W/NEW PROVIDER FOR FUTURE REFILLS 90 tablet 3  . atorvastatin (LIPITOR) 80 MG tablet Take 1 tablet (80 mg total) by mouth daily at 6 PM. Annual appt due in February must see provider for future refills 30 tablet 1  . Coenzyme Q10 60 MG CAPS Take 2 capsules by mouth daily.    . fenofibrate 160 MG tablet TAKE 1 TABLET BY MOUTH EVERY DAY 30 tablet 11  . ibuprofen (ADVIL,MOTRIN) 800 MG tablet TAKE 1 TABLET BY MOUTH EVERY 8 HOURS AS NEEDED 270 tablet 1  . Icosapent Ethyl (VASCEPA) 1 g CAPS Take 2 capsules (2 g total) by mouth 2 (two) times daily. 120 capsule 11  . Magnesium 200 MG TABS Take 1 tablet by mouth.    . niacin (NIASPAN) 1000 MG CR tablet TAKE 2 TABLETS BY MOUTH DAILY AS DIRECTED 60 tablet 5  . NON FORMULARY Take 1 tablet by mouth daily. Full Spectrum Mineral Caps     . omeprazole (PRILOSEC) 40 MG capsule Take 1 capsule (40 mg total) by mouth daily. Annual appt due in February must see provider for future refills 30 capsule 1  . OVER THE COUNTER MEDICATION Take 1 tablet by mouth daily. Minerals  for "bad cramps" strengthens bones and joints and stuff      No current facility-administered medications on file prior to visit.   Review of Systems All otherwise neg per pt     Objective:   Physical Exam BP 126/78   Pulse 60   Temp 98.2 F (36.8 C)   Ht 5\' 9"  (1.753 m)   Wt 220 lb (99.8 kg)   SpO2 99%   BMI 32.49 kg/m  VS noted,  Constitutional: Pt appears in NAD HENT: Head: NCAT.  Right Ear: External ear normal.  Left Ear: External ear normal.  Eyes: . Pupils are equal, round, and reactive to light. Conjunctivae and EOM are normal Nose: without d/c or deformity Neck:  Neck supple. Gross normal ROM Cardiovascular: Normal rate and regular rhythm.   Pulmonary/Chest: Effort normal and breath sounds without rales or wheezing.  Abd:  Soft, NT, ND, + BS, no organomegaly Neurological: Pt is alert. At baseline orientation, motor grossly intact Skin: Skin is warm. No rashes, other new lesions, no LE edema Psychiatric: Pt behavior is normal without agitation  All otherwise neg per pt Lab Results  Component Value Date   WBC 10.8 (H) 11/27/2018   HGB 14.6 11/27/2018   HCT 42.9 11/27/2018   PLT 282.0 11/27/2018   GLUCOSE 95 11/27/2018   CHOL 154 08/21/2019   TRIG 248 (H) 08/21/2019   HDL 20 (L) 08/21/2019   LDLDIRECT 91 08/21/2019   LDLCALC 92 08/21/2019   ALT 17 04/08/2019   AST 25 04/08/2019   NA 135 11/27/2018   K 3.9 11/27/2018   CL 99 11/27/2018   CREATININE 0.92 11/27/2018   BUN 9 11/27/2018   CO2 28 11/27/2018   TSH 3.61 11/27/2018   PSA 0.13 11/27/2018   INR 1.61 (H) 03/07/2011   HGBA1C 5.2 11/27/2018         Assessment & Plan:

## 2019-12-01 NOTE — Assessment & Plan Note (Signed)

## 2019-12-01 NOTE — Patient Instructions (Signed)
Please continue all other medications as before, and refills have been done if requested.  Please have the pharmacy call with any other refills you may need.  Please continue your efforts at being more active, low cholesterol diet, and weight control.  You are otherwise up to date with prevention measures today.  Please keep your appointments with your specialists as you may have planned  You will be contacted regarding the referral for: pulmonary  Please go to the LAB at the blood drawing area for the tests to be done  You will be contacted by phone if any changes need to be made immediately.  Otherwise, you will receive a letter about your results with an explanation, but please check with MyChart first.  Please remember to sign up for MyChart if you have not done so, as this will be important to you in the future with finding out test results, communicating by private email, and scheduling acute appointments online when needed.  Please make an Appointment to return for your 1 year visit, or sooner if needed

## 2019-12-01 NOTE — Assessment & Plan Note (Signed)
For pulm referral r/o OSA

## 2019-12-02 LAB — VITAMIN B12: Vitamin B-12: 238 pg/mL (ref 211–911)

## 2019-12-02 LAB — CBC WITH DIFFERENTIAL/PLATELET
Basophils Absolute: 0.1 10*3/uL (ref 0.0–0.1)
Basophils Relative: 0.6 % (ref 0.0–3.0)
Eosinophils Absolute: 0.3 10*3/uL (ref 0.0–0.7)
Eosinophils Relative: 2.6 % (ref 0.0–5.0)
HCT: 43.9 % (ref 39.0–52.0)
Hemoglobin: 14.5 g/dL (ref 13.0–17.0)
Lymphocytes Relative: 21.5 % (ref 12.0–46.0)
Lymphs Abs: 2.6 10*3/uL (ref 0.7–4.0)
MCHC: 33.1 g/dL (ref 30.0–36.0)
MCV: 93.5 fl (ref 78.0–100.0)
Monocytes Absolute: 1.1 10*3/uL — ABNORMAL HIGH (ref 0.1–1.0)
Monocytes Relative: 9.4 % (ref 3.0–12.0)
Neutro Abs: 8 10*3/uL — ABNORMAL HIGH (ref 1.4–7.7)
Neutrophils Relative %: 65.9 % (ref 43.0–77.0)
Platelets: 253 10*3/uL (ref 150.0–400.0)
RBC: 4.69 Mil/uL (ref 4.22–5.81)
RDW: 13.8 % (ref 11.5–15.5)
WBC: 12.1 10*3/uL — ABNORMAL HIGH (ref 4.0–10.5)

## 2019-12-02 LAB — HEPATIC FUNCTION PANEL
ALT: 29 U/L (ref 0–53)
AST: 23 U/L (ref 0–37)
Albumin: 4.7 g/dL (ref 3.5–5.2)
Alkaline Phosphatase: 39 U/L (ref 39–117)
Bilirubin, Direct: 0.1 mg/dL (ref 0.0–0.3)
Total Bilirubin: 0.4 mg/dL (ref 0.2–1.2)
Total Protein: 7.7 g/dL (ref 6.0–8.3)

## 2019-12-02 LAB — BASIC METABOLIC PANEL
BUN: 12 mg/dL (ref 6–23)
CO2: 29 mEq/L (ref 19–32)
Calcium: 10 mg/dL (ref 8.4–10.5)
Chloride: 100 mEq/L (ref 96–112)
Creatinine, Ser: 0.93 mg/dL (ref 0.40–1.50)
GFR: 84.58 mL/min (ref >60.00)
Glucose, Bld: 100 mg/dL — ABNORMAL HIGH (ref 70–99)
Potassium: 3.6 mEq/L (ref 3.5–5.1)
Sodium: 135 mEq/L (ref 135–145)

## 2019-12-02 LAB — LIPID PANEL
Cholesterol: 149 mg/dL (ref 0–200)
HDL: 32.3 mg/dL — ABNORMAL LOW (ref >39.00)
NonHDL: 116.82
Total CHOL/HDL Ratio: 5
Triglycerides: 203 mg/dL — ABNORMAL HIGH (ref 0.0–149.0)
VLDL: 40.6 mg/dL — ABNORMAL HIGH (ref 0.0–40.0)

## 2019-12-02 LAB — IBC PANEL
Iron: 77 ug/dL (ref 42–165)
Saturation Ratios: 15.9 % — ABNORMAL LOW (ref 20.0–50.0)
Transferrin: 347 mg/dL (ref 212.0–360.0)

## 2019-12-02 LAB — URINALYSIS, ROUTINE W REFLEX MICROSCOPIC
Bilirubin Urine: NEGATIVE
Hgb urine dipstick: NEGATIVE
Ketones, ur: NEGATIVE
Leukocytes,Ua: NEGATIVE
Nitrite: NEGATIVE
RBC / HPF: NONE SEEN (ref 0–?)
Specific Gravity, Urine: <=1.005 — AB (ref 1.000–1.030)
Total Protein, Urine: NEGATIVE
Urine Glucose: NEGATIVE
Urobilinogen, UA: 0.2 (ref 0.0–1.0)
WBC, UA: NONE SEEN (ref 0–?)
pH: 7 (ref 5.0–8.0)

## 2019-12-02 LAB — TSH: TSH: 5.27 u[IU]/mL — ABNORMAL HIGH (ref 0.35–4.50)

## 2019-12-02 LAB — LDL CHOLESTEROL, DIRECT: Direct LDL: 92 mg/dL

## 2019-12-02 LAB — VITAMIN D 25 HYDROXY (VIT D DEFICIENCY, FRACTURES): VITD: 14.83 ng/mL — ABNORMAL LOW (ref 30.00–100.00)

## 2019-12-02 LAB — HEMOGLOBIN A1C: Hgb A1c MFr Bld: 6.1 % (ref 4.6–6.5)

## 2019-12-02 LAB — PSA: PSA: 0.13 ng/mL (ref 0.10–4.00)

## 2019-12-03 ENCOUNTER — Telehealth: Payer: Self-pay

## 2019-12-03 NOTE — Telephone Encounter (Signed)
New message    The patient has questions on test results asking for call back.

## 2019-12-03 NOTE — Telephone Encounter (Signed)
Will call when results are available 

## 2019-12-04 ENCOUNTER — Other Ambulatory Visit: Payer: Self-pay | Admitting: Internal Medicine

## 2019-12-04 MED ORDER — VITAMIN D (ERGOCALCIFEROL) 1.25 MG (50000 UNIT) PO CAPS: 50000.0000 [IU] | ORAL_CAPSULE | ORAL | 0 refills | Status: DC

## 2019-12-05 NOTE — Telephone Encounter (Signed)
Pt has seen mychart message.  °

## 2019-12-10 ENCOUNTER — Other Ambulatory Visit: Payer: Self-pay

## 2019-12-10 ENCOUNTER — Other Ambulatory Visit: Payer: Self-pay | Admitting: Internal Medicine

## 2019-12-10 DIAGNOSIS — I10 Essential (primary) hypertension: Secondary | ICD-10-CM

## 2019-12-10 DIAGNOSIS — K219 Gastro-esophageal reflux disease without esophagitis: Secondary | ICD-10-CM

## 2019-12-10 MED ORDER — AMLODIPINE BESYLATE 10 MG PO TABS: 10.0000 mg | ORAL_TABLET | Freq: Every day | ORAL | 1 refills | Status: DC

## 2019-12-10 MED ORDER — OMEPRAZOLE 40 MG PO CPDR: 40.0000 mg | DELAYED_RELEASE_CAPSULE | Freq: Every day | ORAL | 1 refills | Status: DC

## 2019-12-10 MED ORDER — ATORVASTATIN CALCIUM 80 MG PO TABS: 80.0000 mg | ORAL_TABLET | Freq: Every day | ORAL | 1 refills | Status: DC

## 2019-12-10 NOTE — Telephone Encounter (Signed)
Please refill as per office routine med refill policy (all routine meds refilled for 3 mo or monthly per pt preference up to one year from last visit, then month to month grace period for 3 mo, then further med refills will have to be denied)  

## 2019-12-21 ENCOUNTER — Other Ambulatory Visit: Payer: Self-pay | Admitting: Internal Medicine

## 2019-12-21 NOTE — Telephone Encounter (Signed)
Done erx 

## 2019-12-25 ENCOUNTER — Telehealth: Payer: Self-pay | Admitting: Internal Medicine

## 2019-12-25 NOTE — Telephone Encounter (Signed)
New message:   Pt states he needs a last copy of his labs he had done to turn in for insurance purposes. He states he will be by tomorrow. Please advise.

## 2019-12-25 NOTE — Telephone Encounter (Signed)
Labs printed for pt, will be at front desk.

## 2020-01-21 ENCOUNTER — Other Ambulatory Visit: Payer: Self-pay

## 2020-01-21 DIAGNOSIS — I10 Essential (primary) hypertension: Secondary | ICD-10-CM

## 2020-01-21 MED ORDER — ATENOLOL 100 MG PO TABS: ORAL_TABLET | ORAL | 3 refills | Status: DC

## 2020-01-21 MED ORDER — FENOFIBRATE 160 MG PO TABS: 160.0000 mg | ORAL_TABLET | Freq: Every day | ORAL | 11 refills | Status: DC

## 2020-02-08 ENCOUNTER — Other Ambulatory Visit: Payer: Self-pay | Admitting: Internal Medicine

## 2020-02-08 DIAGNOSIS — K219 Gastro-esophageal reflux disease without esophagitis: Secondary | ICD-10-CM

## 2020-02-08 NOTE — Telephone Encounter (Signed)
Please refill as per office routine med refill policy (all routine meds refilled for 3 mo or monthly per pt preference up to one year from last visit, then month to month grace period for 3 mo, then further med refills will have to be denied)  

## 2020-02-09 ENCOUNTER — Encounter: Payer: Self-pay | Admitting: Pulmonary Disease

## 2020-02-09 ENCOUNTER — Other Ambulatory Visit: Payer: Self-pay

## 2020-02-09 ENCOUNTER — Ambulatory Visit (INDEPENDENT_AMBULATORY_CARE_PROVIDER_SITE_OTHER): Payer: Commercial Managed Care - PPO | Admitting: Pulmonary Disease

## 2020-02-09 VITALS — BP 128/78 | HR 69 | Temp 98.6°F | Ht 68.0 in | Wt 215.2 lb

## 2020-02-09 DIAGNOSIS — G4733 Obstructive sleep apnea (adult) (pediatric): Secondary | ICD-10-CM

## 2020-02-09 NOTE — Progress Notes (Signed)
Tanner Gomez    [id number]    [birth year]  Primary Care Physician:John, Len Blalock, MD  Referring Physician: Corwin Levins, MD [address] [place],  [place] [address]   Chief complaint:   Patient is being seen today for history of snoring, witnessed apneas  HPI:  Longstanding history of snoring Witnessed apneas Nonrestorative sleep Tosses and turns a lot with multiple awakenings Usually goes to bed between 9 and 10 PM Falls asleep easily Wakes up up to 10-12 times Final awakening time 3:45 AM Weight is unchanged recently  An active smoker, about a pack a day, was smoking up to 3 packs a day in the past  Does feel sleepy during the day of the days when he has not had a good nights rest Admits to dryness of his mouth in the mornings Denies headaches Denies significant swelling Memory is good  Dad did snore significantly A few awakenings at night is to use the bathroom   Outpatient Encounter Medications as of 02/09/2020  Medication Sig  . ALPRAZolam (XANAX) 1 MG tablet TAKE 1 TABLET BY MOUTH THREE TIMES A DAY AS NEEDED FOR ANXIETY  . amLODipine (NORVASC) 10 MG tablet Take 1 tablet (10 mg total) by mouth daily. Annual appt due in February must see provider for future refills  . atenolol (TENORMIN) 100 MG tablet TAKE 1 TABLET BY MOUTH DAILY. MUST KEEP SCHEDULE APPT W/NEW PROVIDER FOR FUTURE REFILLS  . atorvastatin (LIPITOR) 80 MG tablet Take 1 tablet (80 mg total) by mouth daily at 6 PM. Annual appt due in February must see provider for future refills  . Coenzyme Q10 60 MG CAPS Take 2 capsules by mouth daily.  . fenofibrate 160 MG tablet Take 1 tablet (160 mg total) by mouth daily.  Marland Kitchen ibuprofen (ADVIL,MOTRIN) 800 MG tablet TAKE 1 TABLET BY MOUTH EVERY 8 HOURS AS NEEDED  . Icosapent Ethyl (VASCEPA) 1 g CAPS Take 2 capsules (2 g total) by mouth 2 (two) times daily.  . Magnesium 200 MG TABS Take 1 tablet by mouth.  . niacin (NIASPAN) 1000 MG CR tablet TAKE 2 TABLETS  BY MOUTH DAILY AS DIRECTED  . NON FORMULARY Take 1 tablet by mouth daily. Full Spectrum Mineral Caps   . omeprazole (PRILOSEC) 40 MG capsule Take 1 capsule (40 mg total) by mouth daily. Annual appt due in February must see provider for future refills  . OVER THE COUNTER MEDICATION Take 1 tablet by mouth daily. Minerals for "bad cramps" strengthens bones and joints and stuff   . Vitamin D, Ergocalciferol, (DRISDOL) 1.25 MG (50000 UNIT) CAPS capsule Take 1 capsule (50,000 Units total) by mouth every 7 (seven) days.   No facility-administered encounter medications on file as of 02/09/2020.    Allergies as of 02/09/2020 - Review Complete 02/09/2020  Allergen Reaction Noted  . Sular Swelling 11/15/2011    Past Medical History:  Diagnosis Date  . Depression   . Hyperlipidemia   . Hypertension   . Pancreatitis   . Tobacco abuse     Past Surgical History:  Procedure Laterality Date  . EYE SURGERY     RIGHT (CHILDHOOD)  . JOINT REPLACEMENT      Family History  Problem Relation Age of Onset  . Kidney disease Mother   . Hypertension Mother   . Heart disease Mother   . Heart attack Mother   . Diabetes Father   . Hypertension Father   . Kidney  Stones Sister   . Heart attack Maternal Grandmother   . Heart attack Paternal Grandfather   . Colon cancer Neg Hx     Social History   Socioeconomic History  . Marital status: Divorced    Spouse name: Not on file  . Number of children: 0  . Years of education: 56  . Highest education level: Not on file  Occupational History  . Occupation: Engineer, materials  Tobacco Use  . Smoking status: Current Every Day Smoker    Packs/day: 1.00    Years: 37.00    Pack years: 37.00    Types: Cigarettes  . Smokeless tobacco: Never Used  Substance and Sexual Activity  . Alcohol use: Yes    Alcohol/week: 33.0 standard drinks    Types: 21 Cans of beer, 12 Standard drinks or equivalent per week  . Drug use: No  . Sexual activity: Yes     Birth control/protection: None    Comment: NUMBER OF SEX PARTNERS IN THE LAST 12 MONTHS 1  Other Topics Concern  . Not on file  Social History Narrative   Fun: Patent examiner, play guitar    Social Determinants of Health   Financial Resource Strain:   . Difficulty of Paying Living Expenses:   Food Insecurity:   . Worried About Programme researcher, broadcasting/film/video in the Last Year:   . Barista in the Last Year:   Transportation Needs:   . Freight forwarder (Medical):   Marland Kitchen Lack of Transportation (Non-Medical):   Physical Activity:   . Days of Exercise per Week:   . Minutes of Exercise per Session:   Stress:   . Feeling of Stress :   Social Connections:   . Frequency of Communication with Friends and Family:   . Frequency of Social Gatherings with Friends and Family:   . Attends Religious Services:   . Active Member of Clubs or Organizations:   . Attends Banker Meetings:   Marland Kitchen Marital Status:   Intimate Partner Violence:   . Fear of Current or Ex-Partner:   . Emotionally Abused:   Marland Kitchen Physically Abused:   . Sexually Abused:     Review of Systems  Respiratory: Positive for apnea.   Cardiovascular: Negative.   Gastrointestinal: Negative.   Psychiatric/Behavioral: Positive for sleep disturbance.    Vitals:   02/09/20 1329  BP: 128/78  Pulse: 69  Temp: 98.6 F (37 C)  SpO2: 97%     Physical Exam  Constitutional: He is oriented to person, place, and time. He appears well-developed.  Obese  HENT:  Head: Normocephalic.  Crowded oropharynx, Mallampati 3  Eyes: Pupils are equal, round, and reactive to light. Conjunctivae are normal.  Neck: No tracheal deviation present. No thyromegaly present.  Cardiovascular: Normal rate.  Pulmonary/Chest: Effort normal. No respiratory distress. He has wheezes. He has no rales. He exhibits no tenderness.  Musculoskeletal:        General: No edema. Normal range of motion.     Cervical back: Normal range of motion and neck  supple.  Neurological: He is alert and oriented to person, place, and time. No cranial nerve deficit.  Skin: Skin is warm and dry. No erythema.  Psychiatric: He has a normal mood and affect.   Results of the Epworth flowsheet 02/09/2020  Sitting and reading 1  Watching TV 1  Sitting, inactive in a public place (e.g. a theatre or a meeting) 0  As a passenger in a car for  an hour without a break 1  Lying down to rest in the afternoon when circumstances permit 3  Sitting and talking to someone 0  Sitting quietly after a lunch without alcohol 0  In a car, while stopped for a few minutes in traffic 0  Total score 6    Assessment:  High probability of significant obstructive sleep apnea  Obesity  Daytime sleepiness  Pathophysiology of sleep disordered breathing discussed with patient Treatment options for sleep disordered breathing discussed with the patient  Active smoker  Plan/Recommendations: We will schedule the patient for a home sleep study  Smoking cessation counseling  We will follow-up in 3 months  Weight loss efforts encouraged Sherrilyn Rist MD [facility] 02/09/2020, 1:58 PM  CC: Biagio Borg, MD

## 2020-02-09 NOTE — Patient Instructions (Signed)
High probability of significant obstructive sleep apnea  We will schedule you for home sleep study We will advise you of results as they become available  Treatment options as we discussed  I will see you back in about 2 to 3 months  Call with significant concerns  Continue efforts to quit smoking   Sleep Apnea Sleep apnea is a condition in which breathing pauses or becomes shallow during sleep. Episodes of sleep apnea usually last 10 seconds or longer, and they may occur as many as 20 times an hour. Sleep apnea disrupts your sleep and keeps your body from getting the rest that it needs. This condition can increase your risk of certain health problems, including:  Heart attack.  Stroke.  Obesity.  Diabetes.  Heart failure.  Irregular heartbeat. What are the causes? There are three kinds of sleep apnea:  Obstructive sleep apnea. This kind is caused by a blocked or collapsed airway.  Central sleep apnea. This kind happens when the part of the brain that controls breathing does not send the correct signals to the muscles that control breathing.  Mixed sleep apnea. This is a combination of obstructive and central sleep apnea. The most common cause of this condition is a collapsed or blocked airway. An airway can collapse or become blocked if:  Your throat muscles are abnormally relaxed.  Your tongue and tonsils are larger than normal.  You are overweight.  Your airway is smaller than normal. What increases the risk? You are more likely to develop this condition if you:  Are overweight.  Smoke.  Have a smaller than normal airway.  Are elderly.  Are male.  Drink alcohol.  Take sedatives or tranquilizers.  Have a family history of sleep apnea. What are the signs or symptoms? Symptoms of this condition include:  Trouble staying asleep.  Daytime sleepiness and tiredness.  Irritability.  Loud snoring.  Morning headaches.  Trouble  concentrating.  Forgetfulness.  Decreased interest in sex.  Unexplained sleepiness.  Mood swings.  Personality changes.  Feelings of depression.  Waking up often during the night to urinate.  Dry mouth.  Sore throat. How is this diagnosed? This condition may be diagnosed with:  A medical history.  A physical exam.  A series of tests that are done while you are sleeping (sleep study). These tests are usually done in a sleep lab, but they may also be done at home. How is this treated? Treatment for this condition aims to restore normal breathing and to ease symptoms during sleep. It may involve managing health issues that can affect breathing, such as high blood pressure or obesity. Treatment may include:  Sleeping on your side.  Using a decongestant if you have nasal congestion.  Avoiding the use of depressants, including alcohol, sedatives, and narcotics.  Losing weight if you are overweight.  Making changes to your diet.  Quitting smoking.  Using a device to open your airway while you sleep, such as: ? An oral appliance. This is a custom-made mouthpiece that shifts your lower jaw forward. ? A continuous positive airway pressure (CPAP) device. This device blows air through a mask when you breathe out (exhale). ? A nasal expiratory positive airway pressure (EPAP) device. This device has valves that you put into each nostril. ? A bi-level positive airway pressure (BPAP) device. This device blows air through a mask when you breathe in (inhale) and breathe out (exhale).  Having surgery if other treatments do not work. During surgery, excess tissue is  removed to create a wider airway. It is important to get treatment for sleep apnea. Without treatment, this condition can lead to:  High blood pressure.  Coronary artery disease.  In men, an inability to achieve or maintain an erection (impotence).  Reduced thinking abilities. Follow these instructions at  home: Lifestyle  Make any lifestyle changes that your health care provider recommends.  Eat a healthy, well-balanced diet.  Take steps to lose weight if you are overweight.  Avoid using depressants, including alcohol, sedatives, and narcotics.  Do not use any products that contain nicotine or tobacco, such as cigarettes, e-cigarettes, and chewing tobacco. If you need help quitting, ask your health care provider. General instructions  Take over-the-counter and prescription medicines only as told by your health care provider.  If you were given a device to open your airway while you sleep, use it only as told by your health care provider.  If you are having surgery, make sure to tell your health care provider you have sleep apnea. You may need to bring your device with you.  Keep all follow-up visits as told by your health care provider. This is important. Contact a health care provider if:  The device that you received to open your airway during sleep is uncomfortable or does not seem to be working.  Your symptoms do not improve.  Your symptoms get worse. Get help right away if:  You develop: ? Chest pain. ? Shortness of breath. ? Discomfort in your back, arms, or stomach.  You have: ? Trouble speaking. ? Weakness on one side of your body. ? Drooping in your face. These symptoms may represent a serious problem that is an emergency. Do not wait to see if the symptoms will go away. Get medical help right away. Call your local emergency services (911 in the [place] Do not drive yourself to the hospital. Summary  Sleep apnea is a condition in which breathing pauses or becomes shallow during sleep.  The most common cause is a collapsed or blocked airway.  The goal of treatment is to restore normal breathing and to ease symptoms during sleep. This information is not intended to replace advice given to you by your health care provider. Make sure you discuss any questions you  have with your health care provider. Document Revised: 03/19/2019 Document Reviewed: 05/28/2018 Elsevier Patient Education  [place].

## 2020-02-10 ENCOUNTER — Other Ambulatory Visit: Payer: Self-pay | Admitting: Internal Medicine

## 2020-02-10 DIAGNOSIS — I10 Essential (primary) hypertension: Secondary | ICD-10-CM

## 2020-02-24 ENCOUNTER — Telehealth: Payer: Self-pay | Admitting: Pulmonary Disease

## 2020-02-24 NOTE — Telephone Encounter (Signed)
Called pt and his mail box is full and I could not leave message will try tomorrow Tanner Gomez

## 2020-02-25 NOTE — Telephone Encounter (Signed)
Spoke to pt he is aware HST has been authorized and he will get a call in the next week or two Tanner Gomez

## 2020-02-29 ENCOUNTER — Other Ambulatory Visit: Payer: Self-pay | Admitting: Internal Medicine

## 2020-02-29 NOTE — Telephone Encounter (Signed)
Please change to OTC Vitamin D3 at 2000 units per day, indefinitely.  

## 2020-03-03 ENCOUNTER — Other Ambulatory Visit: Payer: Self-pay

## 2020-03-03 ENCOUNTER — Ambulatory Visit: Payer: Commercial Managed Care - PPO

## 2020-03-03 DIAGNOSIS — G4733 Obstructive sleep apnea (adult) (pediatric): Secondary | ICD-10-CM | POA: Diagnosis not present

## 2020-03-04 ENCOUNTER — Telehealth: Payer: Self-pay | Admitting: Pulmonary Disease

## 2020-03-04 DIAGNOSIS — G4733 Obstructive sleep apnea (adult) (pediatric): Secondary | ICD-10-CM

## 2020-03-04 NOTE — Telephone Encounter (Signed)
Call patient  Sleep study result  Date of study: 03/03/2020   Impression: Mild obstructive sleep apnea Moderately severe oxygen desaturations  Recommendation: DME referral   CPAP therapy for mild obstructive sleep apnea Auto titrating CPAP with pressure settings of 5-15 will be appropriate  Follow-up in the office 4 to 6 weeks following initiation of treatment

## 2020-03-05 NOTE — Telephone Encounter (Signed)
Attempted to call patient on mobile and at home, left message with wife as voice mailbox is full. Plan to call again on Monday 03/08/2020.

## 2020-03-08 NOTE — Addendum Note (Signed)
Addended by: Vianne Bulls R on: 03/08/2020 11:09 AM   Modules accepted: Orders

## 2020-03-08 NOTE — Telephone Encounter (Signed)
Recommendations from Dr. Wynona Neat   Impressions:   Mild obstructive sleep apnea Moderately severe oxygen desaturations  Recommendations:    Recommend CPAP therapy for mild obstructive sleep apnea Auto titrating CPAP with pressure settings of 5-15 cm H2O Close clinical follow up with compliance monitoring to optimize therapeutic efficiency Weight loss measure encouraged  Caution against driving when sleepy and against medications with sedative effects.  Patient identification verified, results of home sleep study reviewed. Patient is ready to start CPAP therapy. Orders for new CPAP and follow up recall placed. Patient verbalized understanding of results and ongoing plan of care.

## 2020-04-29 LAB — LIPID PANEL
Chol/HDL Ratio: 8.4 ratio — ABNORMAL HIGH (ref 0.0–5.0)
Cholesterol, Total: 160 mg/dL (ref 100–199)
HDL: 19 mg/dL — ABNORMAL LOW (ref >39)
LDL Chol Calc (NIH): 90 mg/dL (ref 0–99)
Triglycerides: 304 mg/dL — ABNORMAL HIGH (ref 0–149)
VLDL Cholesterol Cal: 51 mg/dL — ABNORMAL HIGH (ref 5–40)

## 2020-04-29 LAB — LDL CHOLESTEROL, DIRECT: LDL Direct: 89 mg/dL (ref 0–99)

## 2020-04-30 ENCOUNTER — Other Ambulatory Visit: Payer: Self-pay | Admitting: Internal Medicine

## 2020-04-30 NOTE — Telephone Encounter (Signed)
Done erx 

## 2020-05-05 ENCOUNTER — Ambulatory Visit (INDEPENDENT_AMBULATORY_CARE_PROVIDER_SITE_OTHER): Payer: Commercial Managed Care - PPO | Admitting: Internal Medicine

## 2020-05-05 ENCOUNTER — Other Ambulatory Visit: Payer: Self-pay

## 2020-05-05 ENCOUNTER — Encounter: Payer: Self-pay | Admitting: Internal Medicine

## 2020-05-05 VITALS — BP 134/76 | HR 74 | Ht 69.0 in | Wt 219.6 lb

## 2020-05-05 DIAGNOSIS — Z8719 Personal history of other diseases of the digestive system: Secondary | ICD-10-CM

## 2020-05-05 DIAGNOSIS — E782 Mixed hyperlipidemia: Secondary | ICD-10-CM

## 2020-05-05 DIAGNOSIS — E559 Vitamin D deficiency, unspecified: Secondary | ICD-10-CM

## 2020-05-05 NOTE — Progress Notes (Signed)
LIPID CLINIC CONSULT NOTE  Chief Complaint:  Follow-up hypertriglyceridemia  Primary Care Physician: Corwin Levins, MD  Primary Cardiologist:  No primary care provider on file.  HPI:  RUFFIN LADA is a 54 y.o. male who is being seen today for the evaluation of hypertriglyceridemia at the request of Corwin Levins, MD.  Mr. Szeto is a pleasant 54 year old male who has been followed by Dr. Mayford Knife with a history of hypertension, dyslipidemia, alcohol and tobacco use, depression and pancreatitis in the setting of severe hypertriglyceridemia.  He is also been followed recently by Margaretmary Dys, PharmD in the [facility] lipid clinic.  He was most recently seen in September 2019, at the time it was recommended that he start Vascepa.  Repeat labs, however recently showed no significant change in his triglycerides and he was referred to me.  His total cholesterol was 163, triglycerides 398, HDL 14 and LDL of 69.  A direct LDL was 94.  He does report compliance with the following lipid medications: Atorvastatin 80 mg daily, fenofibrate 160 mg daily and Niaspan 2000 mg daily.  Unfortunately he reports persistent alcohol use.  He says he drinks 5-6 beers a day and this actually represents a significant reduction in his alcohol intake.  We talked about this at great length and a does not seem that he is interested in necessarily stopping the alcohol but does understand that it is adversely affecting his triglycerides and increasing cardiovascular risk.  He seems to feel that since he can perform at his job and it does not seem to interfere with his lifestyle that it is not abnormal, but I would consider his alcohol use excessive.  The barrier to getting Vascepa was cost and it was greater than $200/month.  This may need to be revisited.  09/01/2019  Mr. Perazzo returns today for follow-up of hypertriglyceridemia.  He has had significant improvement in his numbers.  He says he is been able to make significant  dietary changes.  He is cut back on fast foods and eating more healthy meals.  He is more active at work walking around the [employer] on a daily basis.  He also is working to try to start an exercise program at [employer].  He is triglycerides most recently were down to 248 (improved from 422 in the past).  He has had no further episodes of pancreatitis.  He is also reduced alcohol intake significantly.  His most recent lipid profile showed total cholesterol 154, triglycerides 248, HDL 20 LDL 92.  He is also on high potency statin and fenofibrate 160 mg daily.  05/05/2020  Mr. Spray is seen today for follow-up.  He is triglycerides have gone up over the past 6 months.  Currently they are 304 from 203.  LDL cholesterol is 89 with total 160 and HDL is come down from 32-19.  Some of this may be related to dietary changes which she is noted notably made in the last year including more biscuits and other fast food items.  In addition he was diagnosed with low vitamin D levels which could account for his low HDL.  He was given a 50,000 units supplementation and has not yet started on maintenance dosing which I recommended at least 2000 or 3000 units daily.  PMHx:  Past Medical History:  Diagnosis Date  . Depression   . Hyperlipidemia   . Hypertension   . Pancreatitis   . Tobacco abuse     Past Surgical  History:  Procedure Laterality Date  . EYE SURGERY     RIGHT (CHILDHOOD)  . JOINT REPLACEMENT      FAMHx:  Family History  Problem Relation Age of Onset  . Kidney disease Mother   . Hypertension Mother   . Heart disease Mother   . Heart attack Mother   . Diabetes Father   . Hypertension Father   . Kidney Stones Sister   . Heart attack Maternal Grandmother   . Heart attack Paternal Grandfather   . Colon cancer Neg Hx     SOCHx:   reports that he has been smoking cigarettes. He has a 37.00 pack-year smoking history. He has never used smokeless tobacco. He reports current alcohol use of  about 33.0 standard drinks of alcohol per week. He reports that he does not use drugs.  ALLERGIES:  Allergies  Allergen Reactions  . Sular Swelling    ROS: Pertinent items noted in HPI and remainder of comprehensive ROS otherwise negative.  HOME MEDS: Current Outpatient Medications on File Prior to Visit  Medication Sig Dispense Refill  . ALPRAZolam (XANAX) 1 MG tablet TAKE 1 TABLET BY MOUTH THREE TIMES A DAY AS NEEDED FOR ANXIETY 90 tablet 2  . amLODipine (NORVASC) 10 MG tablet Take 1 tablet (10 mg total) by mouth daily. 90 tablet 2  . atenolol (TENORMIN) 100 MG tablet TAKE 1 TABLET BY MOUTH DAILY. MUST KEEP SCHEDULE APPT W/NEW PROVIDER FOR FUTURE REFILLS 90 tablet 3  . atorvastatin (LIPITOR) 80 MG tablet Take 1 tablet (80 mg total) by mouth daily. 90 tablet 2  . Coenzyme Q10 60 MG CAPS Take 2 capsules by mouth daily.    . fenofibrate 160 MG tablet Take 1 tablet (160 mg total) by mouth daily. 30 tablet 11  . ibuprofen (ADVIL,MOTRIN) 800 MG tablet TAKE 1 TABLET BY MOUTH EVERY 8 HOURS AS NEEDED 270 tablet 1  . Icosapent Ethyl (VASCEPA) 1 g CAPS Take 2 capsules (2 g total) by mouth 2 (two) times daily. 120 capsule 11  . Magnesium 200 MG TABS Take 1 tablet by mouth.    . niacin (NIASPAN) 1000 MG CR tablet TAKE 2 TABLETS BY MOUTH DAILY AS DIRECTED 60 tablet 5  . NON FORMULARY Take 1 tablet by mouth daily. Full Spectrum Mineral Caps     . omeprazole (PRILOSEC) 40 MG capsule Take 1 capsule (40 mg total) by mouth daily. 90 capsule 2  . OVER THE COUNTER MEDICATION Take 1 tablet by mouth daily. Minerals for "bad cramps" strengthens bones and joints and stuff     . Vitamin D, Ergocalciferol, (DRISDOL) 1.25 MG (50000 UNIT) CAPS capsule Take 1 capsule (50,000 Units total) by mouth every 7 (seven) days. 12 capsule 0   No current facility-administered medications on file prior to visit.    LABS/IMAGING: No results found for this or any previous visit (from the past 48 hour(s)). No results  found.  LIPID PANEL:    Component Value Date/Time   CHOL 160 04/28/2020 1019   TRIG 304 (H) 04/28/2020 1019   HDL 19 (L) 04/28/2020 1019   CHOLHDL 8.4 (H) 04/28/2020 1019   CHOLHDL 5 12/01/2019 1629   VLDL 40.6 (H) 12/01/2019 1629   LDLCALC 90 04/28/2020 1019   LDLDIRECT 89 04/28/2020 1019   LDLDIRECT 92.0 12/01/2019 1629    WEIGHTS: Wt Readings from Last 3 Encounters:  05/05/20 219 lb 9.6 oz (99.6 kg)  02/09/20 215 lb 3.2 oz (97.6 kg)  12/01/19 220 lb (99.8 kg)  VITALS: BP 134/76   Pulse 74   Ht 5\' 9"  (1.753 m)   Wt 219 lb 9.6 oz (99.6 kg)   SpO2 96%   BMI 32.43 kg/m   EXAM: Deferred  EKG: Deferred  ASSESSMENT: 1. Primary hypertriglyceridemia 2. History of pancreatitis secondary to #1 and alcohol use 3. Alcohol dependence  4. Vitamin D deficiency  PLAN: 1.   Mr. Beckner has had a small increase in triglycerides.  I think this may be due to dietary changes.  Is also diagnosed with some vitamin D deficiency in February with a low level of 14.  He was given 50,000 units but has not started on daily replacement.  I would advise 2000 to 3000 units daily.  This should also help with his cholesterol.  No changes to his current medications.  Repeat lipids in 6 months follow-up with me annually or sooner as necessary.  March, MD, [facility], FACP  [place]  [facility]  Medical Director of the Advanced Lipid [facility] &  Cardiovascular Risk Reduction Clinic Diplomate of the American Board of Clinical Lipidology Attending Cardiologist  Direct Dial: [phone]  Fax: [phone]  Website:  [email or web address]  [phone] Sheina Mcleish 05/05/2020, 10:20 AM

## 2020-05-05 NOTE — Patient Instructions (Signed)
Medication Instructions:  Your physician recommends that you continue on your current medications as directed. Please refer to the Current Medication list given to you today.  *If you need a refill on your cardiac medications before your next appointment, please call your pharmacy*   Lab Work: FASTING lipid panel in 6 months  If you have labs (blood work) drawn today and your tests are completely normal, you will receive your results only by: . MyChart Message (if you have MyChart) OR . A paper copy in the mail If you have any lab test that is abnormal or we need to change your treatment, we will call you to review the results.   Testing/Procedures: NONE   Follow-Up: At [facility], you and your health needs are our priority.  As part of our continuing mission to provide you with exceptional heart care, we have created designated Provider Care Teams.  These Care Teams include your primary Cardiologist (physician) and Advanced Practice Providers (APPs -  Physician Assistants and Nurse Practitioners) who all work together to provide you with the care you need, when you need it.  We recommend signing up for the patient portal called "MyChart".  Sign up information is provided on this After Visit Summary.  MyChart is used to connect with patients for Virtual Visits (Telemedicine).  Patients are able to view lab/test results, encounter notes, upcoming appointments, etc.  Non-urgent messages can be sent to your provider as well.   To learn more about what you can do with MyChart, go to [email or web address].    Your next appointment:   12 month(s) - lipid clinic  The format for your next appointment:   In Person or Virtual  Provider:   K. Chad Hilty, MD   Other Instructions   

## 2020-06-17 ENCOUNTER — Other Ambulatory Visit: Payer: Self-pay | Admitting: Internal Medicine

## 2020-08-10 ENCOUNTER — Encounter: Payer: Self-pay | Admitting: Internal Medicine

## 2020-08-10 ENCOUNTER — Encounter (HOSPITAL_COMMUNITY): Payer: Self-pay | Admitting: Emergency Medicine

## 2020-08-10 ENCOUNTER — Ambulatory Visit: Payer: Commercial Managed Care - PPO | Admitting: Internal Medicine

## 2020-08-10 ENCOUNTER — Observation Stay (HOSPITAL_COMMUNITY)
Admission: EM | Admit: 2020-08-10 | Discharge: 2020-08-11 | Disposition: A | Discharge status: Home / Self Care | Payer: Commercial Managed Care - PPO | Attending: Student | Admitting: Student

## 2020-08-10 ENCOUNTER — Emergency Department (HOSPITAL_COMMUNITY): Payer: Commercial Managed Care - PPO

## 2020-08-10 ENCOUNTER — Other Ambulatory Visit: Payer: Self-pay

## 2020-08-10 DIAGNOSIS — I10 Essential (primary) hypertension: Secondary | ICD-10-CM

## 2020-08-10 DIAGNOSIS — L03221 Cellulitis of neck: Secondary | ICD-10-CM | POA: Insufficient documentation

## 2020-08-10 DIAGNOSIS — L0211 Cutaneous abscess of neck: Secondary | ICD-10-CM

## 2020-08-10 DIAGNOSIS — Z79899 Other long term (current) drug therapy: Secondary | ICD-10-CM | POA: Diagnosis not present

## 2020-08-10 DIAGNOSIS — Z20822 Contact with and (suspected) exposure to covid-19: Secondary | ICD-10-CM | POA: Insufficient documentation

## 2020-08-10 DIAGNOSIS — L03211 Cellulitis of face: Secondary | ICD-10-CM | POA: Diagnosis not present

## 2020-08-10 DIAGNOSIS — R519 Headache, unspecified: Secondary | ICD-10-CM | POA: Diagnosis present

## 2020-08-10 DIAGNOSIS — F17210 Nicotine dependence, cigarettes, uncomplicated: Secondary | ICD-10-CM | POA: Diagnosis not present

## 2020-08-10 LAB — CBC WITH DIFFERENTIAL/PLATELET
Abs Immature Granulocytes: 0.1 10*3/uL — ABNORMAL HIGH (ref 0.00–0.07)
Basophils Absolute: 0.1 10*3/uL (ref 0.0–0.1)
Basophils Relative: 0 %
Eosinophils Absolute: 0.3 10*3/uL (ref 0.0–0.5)
Eosinophils Relative: 1 %
HCT: 44 % (ref 39.0–52.0)
Hemoglobin: 14.7 g/dL (ref 13.0–17.0)
Immature Granulocytes: 1 %
Lymphocytes Relative: 10 %
Lymphs Abs: 2 10*3/uL (ref 0.7–4.0)
MCH: 31.3 pg (ref 26.0–34.0)
MCHC: 33.4 g/dL (ref 30.0–36.0)
MCV: 93.8 fL (ref 80.0–100.0)
Monocytes Absolute: 1.9 10*3/uL — ABNORMAL HIGH (ref 0.1–1.0)
Monocytes Relative: 10 %
Neutro Abs: 15.5 10*3/uL — ABNORMAL HIGH (ref 1.7–7.7)
Neutrophils Relative %: 78 %
Platelets: 265 10*3/uL (ref 150–400)
RBC: 4.69 MIL/uL (ref 4.22–5.81)
RDW: 13.7 % (ref 11.5–15.5)
WBC: 19.8 10*3/uL — ABNORMAL HIGH (ref 4.0–10.5)
nRBC: 0 % (ref 0.0–0.2)

## 2020-08-10 LAB — COMPREHENSIVE METABOLIC PANEL
ALT: 19 U/L (ref 0–44)
AST: 20 U/L (ref 15–41)
Albumin: 3.9 g/dL (ref 3.5–5.0)
Alkaline Phosphatase: 48 U/L (ref 38–126)
Anion gap: 9 (ref 5–15)
BUN: 5 mg/dL — ABNORMAL LOW (ref 6–20)
CO2: 26 mmol/L (ref 22–32)
Calcium: 9.7 mg/dL (ref 8.9–10.3)
Chloride: 102 mmol/L (ref 98–111)
Creatinine, Ser: 1.02 mg/dL (ref 0.61–1.24)
GFR, Estimated: >60 mL/min (ref >60)
Glucose, Bld: 108 mg/dL — ABNORMAL HIGH (ref 70–99)
Potassium: 4 mmol/L (ref 3.5–5.1)
Sodium: 137 mmol/L (ref 135–145)
Total Bilirubin: 0.7 mg/dL (ref 0.3–1.2)
Total Protein: 7.9 g/dL (ref 6.5–8.1)

## 2020-08-10 LAB — RESPIRATORY PANEL BY RT PCR (FLU A&B, COVID)
Influenza A by PCR: NEGATIVE
Influenza B by PCR: NEGATIVE
SARS Coronavirus 2 by RT PCR: NEGATIVE

## 2020-08-10 LAB — HIV ANTIBODY (ROUTINE TESTING W REFLEX): HIV Screen 4th Generation wRfx: NONREACTIVE

## 2020-08-10 MED ORDER — CEFTRIAXONE SODIUM 1 G IJ SOLR
1.0000 g | Freq: Once | INTRAMUSCULAR | Status: AC
  Administered 2020-08-10: 1 g via INTRAMUSCULAR

## 2020-08-10 MED ORDER — METHYLPREDNISOLONE SODIUM SUCC 40 MG IJ SOLR
40.0000 mg | Freq: Two times a day (BID) | INTRAMUSCULAR | Status: DC
  Administered 2020-08-10 – 2020-08-11 (×2): 40 mg via INTRAVENOUS
  Filled 2020-08-10 (×2): qty 1

## 2020-08-10 MED ORDER — NIACIN ER (ANTIHYPERLIPIDEMIC) 500 MG PO TBCR
1000.0000 mg | EXTENDED_RELEASE_TABLET | Freq: Every day | ORAL | Status: DC
  Administered 2020-08-10: 1000 mg via ORAL
  Filled 2020-08-10 (×2): qty 2

## 2020-08-10 MED ORDER — VITAMIN D (ERGOCALCIFEROL) 1.25 MG (50000 UNIT) PO CAPS: 50000.0000 [IU] | ORAL_CAPSULE | ORAL | Status: DC

## 2020-08-10 MED ORDER — IOHEXOL 300 MG/ML  SOLN
75.0000 mL | Freq: Once | INTRAMUSCULAR | Status: AC | PRN
  Administered 2020-08-10: 75 mL via INTRAVENOUS

## 2020-08-10 MED ORDER — MAGNESIUM OXIDE 400 (241.3 MG) MG PO TABS
400.0000 mg | ORAL_TABLET | Freq: Every day | ORAL | Status: DC
  Administered 2020-08-11: 400 mg via ORAL
  Filled 2020-08-10: qty 1

## 2020-08-10 MED ORDER — SODIUM CHLORIDE 0.9 % IV SOLN
3.0000 g | Freq: Three times a day (TID) | INTRAVENOUS | Status: DC
  Administered 2020-08-10 – 2020-08-11 (×3): 3 g via INTRAVENOUS
  Filled 2020-08-10 (×7): qty 8

## 2020-08-10 MED ORDER — ATORVASTATIN CALCIUM 80 MG PO TABS
80.0000 mg | ORAL_TABLET | Freq: Every day | ORAL | Status: DC
  Administered 2020-08-11: 80 mg via ORAL
  Filled 2020-08-10: qty 1

## 2020-08-10 MED ORDER — AMLODIPINE BESYLATE 10 MG PO TABS
10.0000 mg | ORAL_TABLET | Freq: Every day | ORAL | Status: DC
  Administered 2020-08-11: 10 mg via ORAL
  Filled 2020-08-10: qty 1

## 2020-08-10 MED ORDER — HYDRALAZINE HCL 20 MG/ML IJ SOLN: 5.0000 mg | Freq: Four times a day (QID) | INTRAMUSCULAR | Status: DC | PRN

## 2020-08-10 MED ORDER — ACETAMINOPHEN 325 MG PO TABS: 650.0000 mg | ORAL_TABLET | Freq: Four times a day (QID) | ORAL | Status: DC | PRN

## 2020-08-10 MED ORDER — PANTOPRAZOLE SODIUM 40 MG PO TBEC
40.0000 mg | DELAYED_RELEASE_TABLET | Freq: Every day | ORAL | Status: DC
  Administered 2020-08-11: 40 mg via ORAL
  Filled 2020-08-10: qty 1

## 2020-08-10 MED ORDER — COENZYME Q10 60 MG PO CAPS: 2.0000 | ORAL_CAPSULE | Freq: Every day | ORAL | Status: DC

## 2020-08-10 MED ORDER — ACETAMINOPHEN 650 MG RE SUPP: 650.0000 mg | Freq: Four times a day (QID) | RECTAL | Status: DC | PRN

## 2020-08-10 MED ORDER — KETOROLAC TROMETHAMINE 30 MG/ML IJ SOLN
30.0000 mg | Freq: Four times a day (QID) | INTRAMUSCULAR | Status: DC | PRN
  Administered 2020-08-10 (×2): 30 mg via INTRAVENOUS
  Filled 2020-08-10 (×2): qty 1

## 2020-08-10 MED ORDER — SODIUM CHLORIDE 0.9 % IV SOLN
INTRAVENOUS | Status: DC
  Administered 2020-08-10: 1000 mL via INTRAVENOUS

## 2020-08-10 MED ORDER — SODIUM CHLORIDE 0.9 % IV SOLN: INTRAVENOUS | Status: DC

## 2020-08-10 MED ORDER — VANCOMYCIN HCL IN DEXTROSE 1-5 GM/200ML-% IV SOLN
1000.0000 mg | Freq: Once | INTRAVENOUS | Status: AC
  Administered 2020-08-10: 1000 mg via INTRAVENOUS
  Filled 2020-08-10: qty 200

## 2020-08-10 MED ORDER — ICOSAPENT ETHYL 1 G PO CAPS
2.0000 g | ORAL_CAPSULE | Freq: Two times a day (BID) | ORAL | Status: DC
  Administered 2020-08-10 – 2020-08-11 (×2): 2 g via ORAL
  Filled 2020-08-10 (×3): qty 2

## 2020-08-10 MED ORDER — ALPRAZOLAM 0.25 MG PO TABS
0.2500 mg | ORAL_TABLET | Freq: Every evening | ORAL | Status: DC | PRN
  Administered 2020-08-10: 0.25 mg via ORAL
  Filled 2020-08-10: qty 1

## 2020-08-10 MED ORDER — ATENOLOL 50 MG PO TABS
100.0000 mg | ORAL_TABLET | Freq: Every day | ORAL | Status: DC
  Administered 2020-08-11: 100 mg via ORAL
  Filled 2020-08-10: qty 2

## 2020-08-10 MED ORDER — FENOFIBRATE 160 MG PO TABS
160.0000 mg | ORAL_TABLET | Freq: Every day | ORAL | Status: DC
  Administered 2020-08-11: 160 mg via ORAL
  Filled 2020-08-10: qty 1

## 2020-08-10 NOTE — Assessment & Plan Note (Signed)
Very high suspicion as above

## 2020-08-10 NOTE — Progress Notes (Signed)
Subjective:    Patient ID: Tanner Gomez, male    DOB: Nov 09, 1965, 54 y.o.   MRN: [id number]  HPI  Here with 6 days onset pain and swelling (no fever chills) starting at the right maxillary sinus and zygoma area but gradually worsening over the last 6 days to involve the whole right face and submandibular and right neck area now obviously causing difficulty with swallowing and breathing, somewhat wheezing today as well.  No prior hx of same.  Pt denies chest pain, increased sob or doe, wheezing, orthopnea, PND, increased LE swelling, palpitations, dizziness or syncope.  Pt denies new neurological symptoms such as new headache, or facial or extremity weakness or numbness   Pt denies polydipsia, polyuria Past Medical History:  Diagnosis Date  . Depression   . Hyperlipidemia   . Hypertension   . Pancreatitis   . Tobacco abuse    Past Surgical History:  Procedure Laterality Date  . EYE SURGERY     RIGHT (CHILDHOOD)  . JOINT REPLACEMENT      reports that he has been smoking cigarettes. He has a 37.00 pack-year smoking history. He has never used smokeless tobacco. He reports current alcohol use of about 33.0 standard drinks of alcohol per week. He reports that he does not use drugs. family history includes Diabetes in his father; Heart attack in his maternal grandmother, mother, and paternal grandfather; Heart disease in his mother; Hypertension in his father and mother; Kidney Stones in his sister; Kidney disease in his mother. Allergies  Allergen Reactions  . Sular Swelling   Current Outpatient Medications on File Prior to Visit  Medication Sig Dispense Refill  . ALPRAZolam (XANAX) 1 MG tablet TAKE 1 TABLET BY MOUTH THREE TIMES A DAY AS NEEDED FOR ANXIETY 90 tablet 2  . amLODipine (NORVASC) 10 MG tablet Take 1 tablet (10 mg total) by mouth daily. 90 tablet 2  . atenolol (TENORMIN) 100 MG tablet TAKE 1 TABLET BY MOUTH DAILY. MUST KEEP SCHEDULE APPT W/NEW PROVIDER FOR FUTURE REFILLS 90  tablet 3  . atorvastatin (LIPITOR) 80 MG tablet Take 1 tablet (80 mg total) by mouth daily. 90 tablet 2  . Coenzyme Q10 60 MG CAPS Take 2 capsules by mouth daily.    . fenofibrate 160 MG tablet Take 1 tablet (160 mg total) by mouth daily. 30 tablet 11  . ibuprofen (ADVIL,MOTRIN) 800 MG tablet TAKE 1 TABLET BY MOUTH EVERY 8 HOURS AS NEEDED 270 tablet 1  . Magnesium 200 MG TABS Take 1 tablet by mouth.    . niacin (NIASPAN) 1000 MG CR tablet TAKE 2 TABLETS BY MOUTH DAILY AS DIRECTED 60 tablet 5  . NON FORMULARY Take 1 tablet by mouth daily. Full Spectrum Mineral Caps     . omeprazole (PRILOSEC) 40 MG capsule Take 1 capsule (40 mg total) by mouth daily. 90 capsule 2  . OVER THE COUNTER MEDICATION Take 1 tablet by mouth daily. Minerals for "bad cramps" strengthens bones and joints and stuff     . VASCEPA 1 g capsule TAKE 2 CAPSULES BY MOUTH 2 TIMES DAILY. 120 capsule 11  . Vitamin D, Ergocalciferol, (DRISDOL) 1.25 MG (50000 UNIT) CAPS capsule Take 1 capsule (50,000 Units total) by mouth every 7 (seven) days. 12 capsule 0   No current facility-administered medications on file prior to visit.   Review of Systems All otherwise neg per pt     Objective:   Physical Exam BP 130/90 (BP Location: Left Arm, Patient Position: Sitting,  Cuff Size: Large)   Pulse 81   Temp 98.8 F (37.1 C) (Oral)   Ht 5\' 9"  (1.753 m)   Wt 223 lb (101.2 kg)   SpO2 95%   BMI 32.93 kg/m  VS noted,  Constitutional: Pt appears in NAD HENT: Head: NCAT.  Right Ear: External ear normal.  Left Ear: External ear normal.  Eyes: . Pupils are equal, round, and reactive to light. Conjunctivae and EOM are normal Nose: without d/c or deformity Right temple/face/submandibular and neck area with 2-3+ tender swelling without overt fluctuance I can palpate Neck: Neck supple. Gross normal ROM Cardiovascular: Normal rate and regular rhythm.   Pulmonary/Chest: Effort normal and breath sounds without rales or wheezing.  Abd:  Soft,  NT, ND, + BS, no organomegaly Neurological: Pt is alert. At baseline orientation, motor grossly intact Skin: Skin is warm. No rashes, other new lesions, no LE edema Psychiatric: Pt behavior is normal without agitation  All otherwise neg per pt Lab Results  Component Value Date   WBC 12.1 (H) 12/01/2019   HGB 14.5 12/01/2019   HCT 43.9 12/01/2019   PLT 253.0 12/01/2019   GLUCOSE 100 (H) 12/01/2019   CHOL 160 04/28/2020   TRIG 304 (H) 04/28/2020   HDL 19 (L) 04/28/2020   LDLDIRECT 89 04/28/2020   LDLCALC 90 04/28/2020   ALT 29 12/01/2019   AST 23 12/01/2019   NA 135 12/01/2019   K 3.6 12/01/2019   CL 100 12/01/2019   CREATININE 0.93 12/01/2019   BUN 12 12/01/2019   CO2 29 12/01/2019   TSH 5.27 (H) 12/01/2019   PSA 0.13 12/01/2019   INR 1.61 (H) 03/07/2011   HGBA1C 6.1 12/01/2019       Assessment & Plan:

## 2020-08-10 NOTE — ED Triage Notes (Signed)
Pt sent to ED by PCP for possible abscess on his neck with possible airway compromised.

## 2020-08-10 NOTE — H&P (Signed)
History and Physical    RONAV FURNEY GMW:[id number] DOB: [birth year] DOA: 08/10/2020  PCP: Corwin Levins, MD (Confirm with patient/family/NH records and if not entered, this has to be entered at [facility] point of entry) Patient coming from: Home  I have personally briefly reviewed patient's old medical records in [facility]  Chief Complaint: Right face swelling and pain  HPI: Tanner Gomez is a 54 y.o. male with medical history significant of HTN, HLD, anxiety depression, GERD started to have right face swelling and pain for 3 days.  Symptoms started 3 days ago and started with right face numbness, soon started to swell.  Last 2 days has been having trouble even drink because severe pain associated with opening mouth.  The pain has been shooting like to right eyelid and right ear.  Denies any shortness of breath, but yesterday the pain started to involve right-sided overnight.  No drainage inside or outside mouth, no shortness of breath no stridor, no voice change, no nauseous vomiting or diarrhea.  Went to PCP today for emergency evaluation was diagnosed with right neck/face abscess and sent to ED.  Has subjective fever cycled with chills at home.  Patient denied any toothache recently. ED Course: Right side face swelling with trouble opening mouth fully. WBC count 19, CT facial: Phlegmonous changes and cellulitis of the right face and upper neck centered over right maxillary no abscess identified.  Periapical looseness of the right maxillary second premolar. Review of Systems: As per HPI otherwise 14 point review of systems negative.    Past Medical History:  Diagnosis Date  . Depression   . Hyperlipidemia   . Hypertension   . Pancreatitis   . Tobacco abuse     Past Surgical History:  Procedure Laterality Date  . EYE SURGERY     RIGHT (CHILDHOOD)  . JOINT REPLACEMENT       reports that he has been smoking cigarettes. He has a 37.00 pack-year smoking history. He has never used  smokeless tobacco. He reports current alcohol use of about 33.0 standard drinks of alcohol per week. He reports that he does not use drugs.  Allergies  Allergen Reactions  . Sular Swelling    Family History  Problem Relation Age of Onset  . Kidney disease Mother   . Hypertension Mother   . Heart disease Mother   . Heart attack Mother   . Diabetes Father   . Hypertension Father   . Kidney Stones Sister   . Heart attack Maternal Grandmother   . Heart attack Paternal Grandfather   . Colon cancer Neg Hx     Prior to Admission medications   Medication Sig Start Date End Date Taking? Authorizing Provider  ALPRAZolam Prudy Feeler) 1 MG tablet TAKE 1 TABLET BY MOUTH THREE TIMES A DAY AS NEEDED FOR ANXIETY 04/30/20   Corwin Levins, MD  amLODipine (NORVASC) 10 MG tablet Take 1 tablet (10 mg total) by mouth daily. 02/10/20   Corwin Levins, MD  atenolol (TENORMIN) 100 MG tablet TAKE 1 TABLET BY MOUTH DAILY. MUST KEEP SCHEDULE APPT W/NEW PROVIDER FOR FUTURE REFILLS 01/21/20   Corwin Levins, MD  atorvastatin (LIPITOR) 80 MG tablet Take 1 tablet (80 mg total) by mouth daily. 02/10/20   Corwin Levins, MD  Coenzyme Q10 60 MG CAPS Take 2 capsules by mouth daily.    [provider]  fenofibrate 160 MG tablet Take 1 tablet (160 mg total) by mouth daily. 01/21/20  Corwin Levins, MD  ibuprofen (ADVIL,MOTRIN) 800 MG tablet TAKE 1 TABLET BY MOUTH EVERY 8 HOURS AS NEEDED 07/08/15   Tonye Pearson, MD  Magnesium 200 MG TABS Take 1 tablet by mouth.    [provider]  niacin (NIASPAN) 1000 MG CR tablet TAKE 2 TABLETS BY MOUTH DAILY AS DIRECTED 02/10/20   Corwin Levins, MD  NON FORMULARY Take 1 tablet by mouth daily. Full Spectrum Mineral Caps     [provider]  omeprazole (PRILOSEC) 40 MG capsule Take 1 capsule (40 mg total) by mouth daily. 02/10/20   Corwin Levins, MD  OVER THE COUNTER MEDICATION Take 1 tablet by mouth daily. Minerals for "bad cramps" strengthens bones and joints and stuff      [provider]  VASCEPA 1 g capsule TAKE 2 CAPSULES BY MOUTH 2 TIMES DAILY. 06/18/20   Hilty, Lisette Abu, MD  Vitamin D, Ergocalciferol, (DRISDOL) 1.25 MG (50000 UNIT) CAPS capsule Take 1 capsule (50,000 Units total) by mouth every 7 (seven) days. 12/04/19   Corwin Levins, MD    Physical Exam: Vitals:   08/10/20 1119 08/10/20 1120  BP: (!) 144/94   Pulse: 76   Resp: 20   Temp: 98.3 F (36.8 C)   TempSrc: Oral   SpO2: 94%   Weight: 105.7 kg 101.2 kg  Height: 5\' 10"  (1.778 m) 5\' 9"  (1.753 m)    Constitutional: NAD, calm, comfortable Vitals:   08/10/20 1119 08/10/20 1120  BP: (!) 144/94   Pulse: 76   Resp: 20   Temp: 98.3 F (36.8 C)   TempSrc: Oral   SpO2: 94%   Weight: 105.7 kg 101.2 kg  Height: 5\' 10"  (1.778 m) 5\' 9"  (1.753 m)   Eyes: PERRL, lids and conjunctivae normal ENMT: Swelling and rash of the right side of the face, open mouth to extend allow 2 fingers to pass Neck: Rash of right-sided neck with tenderness and lymphadenopathy, no stridor. Respiratory: clear to auscultation bilaterally, no wheezing, no crackles. Normal respiratory effort. No accessory muscle use.  Cardiovascular: Regular rate and rhythm, no murmurs / rubs / gallops. No extremity edema. 2+ pedal pulses. No carotid bruits.  Abdomen: no tenderness, no masses palpated. No hepatosplenomegaly. Bowel sounds positive.  Musculoskeletal: no clubbing / cyanosis. No joint deformity upper and lower extremities. Good ROM, no contractures. Normal muscle tone.  Skin: no rashes, lesions, ulcers. No induration Neurologic: CN 2-12 grossly intact. Sensation intact, DTR normal. Strength 5/5 in all 4.  Psychiatric: Normal judgment and insight. Alert and oriented x 3. Normal mood.       Labs on Admission: I have personally reviewed following labs and imaging studies  CBC: Recent Labs  Lab 08/10/20 1137  WBC 19.8*  NEUTROABS 15.5*  HGB 14.7  HCT 44.0  MCV 93.8  PLT 265   Basic Metabolic  Panel: Recent Labs  Lab 08/10/20 1137  NA 137  K 4.0  CL 102  CO2 26  GLUCOSE 108*  BUN 5*  CREATININE 1.02  CALCIUM 9.7   GFR: Estimated Creatinine Clearance: 97.1 mL/min (by C-G formula based on SCr of 1.02 mg/dL). Liver Function Tests: Recent Labs  Lab 08/10/20 1137  AST 20  ALT 19  ALKPHOS 48  BILITOT 0.7  PROT 7.9  ALBUMIN 3.9   No results for input(s): LIPASE, AMYLASE in the last 168 hours. No results for input(s): AMMONIA in the last 168 hours. Coagulation Profile: No results for input(s): INR, PROTIME in the  last 168 hours. Cardiac Enzymes: No results for input(s): CKTOTAL, CKMB, CKMBINDEX, TROPONINI in the last 168 hours. BNP (last 3 results) No results for input(s): PROBNP in the last 8760 hours. HbA1C: No results for input(s): HGBA1C in the last 72 hours. CBG: No results for input(s): GLUCAP in the last 168 hours. Lipid Profile: No results for input(s): CHOL, HDL, LDLCALC, TRIG, CHOLHDL, LDLDIRECT in the last 72 hours. Thyroid Function Tests: No results for input(s): TSH, T4TOTAL, FREET4, T3FREE, THYROIDAB in the last 72 hours. Anemia Panel: No results for input(s): VITAMINB12, FOLATE, FERRITIN, TIBC, IRON, RETICCTPCT in the last 72 hours. Urine analysis:    Component Value Date/Time   COLORURINE YELLOW 12/01/2019 1629   APPEARANCEUR CLEAR 12/01/2019 1629   LABSPEC <=1.005 (A) 12/01/2019 1629   PHURINE 7.0 12/01/2019 1629   GLUCOSEU NEGATIVE 12/01/2019 1629   HGBUR NEGATIVE 12/01/2019 1629   BILIRUBINUR NEGATIVE 12/01/2019 1629   BILIRUBINUR neg 12/30/2014 1456   KETONESUR NEGATIVE 12/01/2019 1629   PROTEINUR trace 12/30/2014 1456   PROTEINUR 30 (A) 07/07/2009 1412   UROBILINOGEN 0.2 12/01/2019 1629   NITRITE NEGATIVE 12/01/2019 1629   LEUKOCYTESUR NEGATIVE 12/01/2019 1629    Radiological Exams on Admission: CT Maxillofacial W Contrast  Result Date: 08/10/2020 CLINICAL DATA:  Right-sided facial pain.  Possible abscess. EXAM: CT  MAXILLOFACIAL WITH CONTRAST TECHNIQUE: Multidetector CT imaging of the maxillofacial structures was performed with intravenous contrast. Multiplanar CT image reconstructions were also generated. CONTRAST:  75mL OMNIPAQUE IOHEXOL 300 MG/ML  SOLN COMPARISON:  None. FINDINGS: Osseous: No fracture or mandibular dislocation. No destructive process. Orbits: Negative. No traumatic or inflammatory finding. Sinuses: Minimal scattered mucosal thickening, including inferior maxillary sinuses and scattered ethmoid air cells. No air-fluid levels. Soft tissues: There is soft tissue stranding and phlegmonous change overlying the right maxilla with stranding of the overlying subcutaneous fat. Stranding extends into the imaged upper right neck. Prominent right-sided lymph nodes, likely reactive. No evidence of suppuration. Limited intracranial: No significant or unexpected finding. Dental: Scattered periapical lucencies, including periapical lucency of the right maxillary second premolar. IMPRESSION: 1. Findings consistent with phlegmonous change and cellulitis of the right face and upper neck, centered over the right maxilla. No discrete drainable abscess identified. 2. While nonspecific, findings may be odontogenic in origin. There is a periapical lucencies of the right maxillary second premolar without cortical dehiscence. Electronically Signed   By: Feliberto HartsFrederick S Jones MD   On: 08/10/2020 13:48    EKG: None  Assessment/Plan Active Problems:   Facial cellulitis  (please populate well all problems here in Problem List. (For example, if patient is on BP meds at home and you resume or decide to hold them, it is a problem that needs to be her. Same for CAD, COPD, HLD and so on)  Facial/neck cellulitis -Suspect this related to rather asymptomatic tooth canal infection.  Received 1 dose of ceftriaxone at PCPs office and received 1 dose of vancomycin in the ED.  We will start Unasyn, recheck CBC tomorrow and evaluate clinically  see whether facial swelling and swallowing function improved and decide whether patient can be discharged to p.o. antibiotic presumably Augmentin and to follow-up with dentist.  Otherwise consider consult oral surgeon, as of now there is no symptoms signs of abscess both clinically and by CAT scan. -Short course of steroid -Send MRSA screening and ASO  Hypertension -Resume home BP meds, add as needed hydralazine.  Patient consult swallow pills  HLD -On statin  GERD -Continue PPI  DVT prophylaxis: SCD  Code Status: Full code Family Communication: None at bedside Disposition Plan: *Expect less than 2 midnight hospital stay, expect discharge tomorrow if symptoms improved. Consults called: None Admission status: MedSurg observation   Emeline General MD [facility] Pager [phone]  08/10/2020, 2:49 PM

## 2020-08-10 NOTE — Assessment & Plan Note (Signed)
stable overall by history and exam, recent data reviewed with pt, and pt to continue medical treatment as before,  to f/u any worsening symptoms or concerns  

## 2020-08-10 NOTE — Assessment & Plan Note (Addendum)
Very severe with high suspicion for neck abscess now symptomatic with some airway and swallowing issue; pt for rocephin 1 gm IM now, but also refer to ED now for urgent CT to r/o absess abd airway compromise as is high risk by exam  I spent 31 minutes in preparing to see the patient by review of recent labs, imaging and procedures, obtaining and reviewing separately obtained history, communicating with the patient and family or caregiver, ordering medications, tests or procedures, and documenting clinical information in the EHR including the differential Dx, treatment, and any further evaluation and other management of facial cellulitis, neck abscess, and htn

## 2020-08-10 NOTE — Addendum Note (Signed)
Addended by: Delsa Grana R on: 08/10/2020 10:40 AM   Modules accepted: Orders

## 2020-08-10 NOTE — Progress Notes (Addendum)
Pharmacy Antibiotic Note  Tanner Gomez is a 54 y.o. male admitted on 08/10/2020 with right face swelling and pain for 3 days.  Pharmacy has been consulted for Unasyn dosing for facial and neck cellulitis.  MD suspects related to tooth canal infection.  SCr 1.02, CrCL 97 ml/min, afebrile, WBC 19.8.  Plan: Unasyn 3gm IV Q8H Pharmacy will sign off as renal fxn is stable.  Thank you for the consult!   Height: 5\' 9"  (175.3 cm) Weight: 101.2 kg (223 lb 1.7 oz) IBW/kg (Calculated) : 70.7  Temp (24hrs), Avg:98.6 F (37 C), Min:98.3 F (36.8 C), Max:98.8 F (37.1 C)  Recent Labs  Lab 08/10/20 1137  WBC 19.8*  CREATININE 1.02    Estimated Creatinine Clearance: 97.1 mL/min (by C-G formula based on SCr of 1.02 mg/dL).    Allergies  Allergen Reactions  . Sular Swelling    Unasyn 10/26 >> CTX 10/26 at PCP's office Vanc x1 10/26  10/26 MRSA PCR -  10/26 BCx -   Britany Callicott D. 11-22-1983, PharmD, BCPS, BCCCP 08/10/2020, 3:12 PM

## 2020-08-10 NOTE — ED Provider Notes (Signed)
[facility] EMERGENCY DEPARTMENT Provider Note   CSN: [id number] Arrival date & time: 08/10/20  1114     History Chief Complaint  Patient presents with  . Abscess    Tanner Gomez is a 54 y.o. male.  HPI   This patient is a 54 year old male with a history of hypertension tobacco use but no diabetes.  He presents from his family doctor's office after developing 4 days of gradually worsening persistent and now severe pain and swelling of the right side of his face.  He denies any specific injuries or dental trauma and has not critically had any dental pain certainly has not had any fevers but is noticed that swelling which was initially in the cheek and up towards the right eye has now spread down towards the lower jaw.  He has not been able to eat in a couple of days because he cannot open his mouth secondary to pain.  The concern for spread of this infection down towards his neck was seen when he went to his doctor's office and was found to have tenderness extending onto the right side of his neck.  He has no difficulty breathing or swallowing or talking but cannot open his mouth.  No recent antibiotics until he went to the doctor's office where he was given Rocephin.  He denies chest pain or shortness of breath, denies nausea or vomiting, he has had some diarrhea but states he has been taking lots of ibuprofen and some Tylenol   Past Medical History:  Diagnosis Date  . Depression   . Hyperlipidemia   . Hypertension   . Pancreatitis   . Tobacco abuse     Patient Active Problem List   Diagnosis Date Noted  . Facial cellulitis 08/10/2020  . Neck abscess 08/10/2020  . Hypersomnolence 12/01/2019  . Hyperglycemia 11/27/2018  . Chest pain 12/15/2017  . Back pain 12/15/2017  . Preventative health care 06/15/2017  . Muscle cramping 06/14/2016  . Pancreatitis   . Tobacco abuse   . GAD (generalized anxiety disorder) 07/16/2012  . HTN (hypertension) 11/09/2011  .  Hypertriglyceridemia 11/09/2011  . GERD (gastroesophageal reflux disease) 11/09/2011  . LFT elevation 11/09/2011  . Depression 11/09/2011  . Gout 11/09/2011  . Acute pancreatitis 11/09/2011    Past Surgical History:  Procedure Laterality Date  . EYE SURGERY     RIGHT (CHILDHOOD)  . JOINT REPLACEMENT         Family History  Problem Relation Age of Onset  . Kidney disease Mother   . Hypertension Mother   . Heart disease Mother   . Heart attack Mother   . Diabetes Father   . Hypertension Father   . Kidney Stones Sister   . Heart attack Maternal Grandmother   . Heart attack Paternal Grandfather   . Colon cancer Neg Hx     Social History   Tobacco Use  . Smoking status: Current Every Day Smoker    Packs/day: 1.00    Years: 37.00    Pack years: 37.00    Types: Cigarettes  . Smokeless tobacco: Never Used  Vaping Use  . Vaping Use: Never used  Substance Use Topics  . Alcohol use: Yes    Alcohol/week: 33.0 standard drinks    Types: 21 Cans of beer, 12 Standard drinks or equivalent per week  . Drug use: No    Home Medications Prior to Admission medications   Medication Sig Start Date End Date Taking? Authorizing Provider  ALPRAZolam (XANAX) 1 MG tablet TAKE 1 TABLET BY MOUTH THREE TIMES A DAY AS NEEDED FOR ANXIETY 04/30/20   Corwin Levins, MD  amLODipine (NORVASC) 10 MG tablet Take 1 tablet (10 mg total) by mouth daily. 02/10/20   Corwin Levins, MD  atenolol (TENORMIN) 100 MG tablet TAKE 1 TABLET BY MOUTH DAILY. MUST KEEP SCHEDULE APPT W/NEW PROVIDER FOR FUTURE REFILLS 01/21/20   Corwin Levins, MD  atorvastatin (LIPITOR) 80 MG tablet Take 1 tablet (80 mg total) by mouth daily. 02/10/20   Corwin Levins, MD  Coenzyme Q10 60 MG CAPS Take 2 capsules by mouth daily.    [provider]  fenofibrate 160 MG tablet Take 1 tablet (160 mg total) by mouth daily. 01/21/20   Corwin Levins, MD  ibuprofen (ADVIL,MOTRIN) 800 MG tablet TAKE 1 TABLET BY MOUTH EVERY 8 HOURS AS NEEDED  07/08/15   Tonye Pearson, MD  Magnesium 200 MG TABS Take 1 tablet by mouth.    [provider]  niacin (NIASPAN) 1000 MG CR tablet TAKE 2 TABLETS BY MOUTH DAILY AS DIRECTED 02/10/20   Corwin Levins, MD  NON FORMULARY Take 1 tablet by mouth daily. Full Spectrum Mineral Caps     [provider]  omeprazole (PRILOSEC) 40 MG capsule Take 1 capsule (40 mg total) by mouth daily. 02/10/20   Corwin Levins, MD  OVER THE COUNTER MEDICATION Take 1 tablet by mouth daily. Minerals for "bad cramps" strengthens bones and joints and stuff     [provider]  VASCEPA 1 g capsule TAKE 2 CAPSULES BY MOUTH 2 TIMES DAILY. 06/18/20   Hilty, Lisette Abu, MD  Vitamin D, Ergocalciferol, (DRISDOL) 1.25 MG (50000 UNIT) CAPS capsule Take 1 capsule (50,000 Units total) by mouth every 7 (seven) days. 12/04/19   Corwin Levins, MD    Allergies    Sular  Review of Systems   Review of Systems  All other systems reviewed and are negative.   Physical Exam Updated Vital Signs BP (!) 144/94   Pulse 76   Temp 98.3 F (36.8 C) (Oral)   Resp 20   Ht 1.753 m (5\' 9" )   Wt 101.2 kg   SpO2 94%   BMI 32.95 kg/m   Physical Exam Vitals and nursing note reviewed.  Constitutional:      General: He is not in acute distress.    Appearance: He is well-developed.  HENT:     Head: Normocephalic and atraumatic.     Comments: There is some right-sided facial swelling involving the right upper jaw but more so the right lower jaw, he has trismus and is not able to open his mouth past approximately 1-1/2 cm.  He has some tenderness extending onto the right anterior cervical chain but no palpable lymph nodes.  There is no tenderness in the submental area or the lower neck.  He has limited range of motion of his neck.    Mouth/Throat:     Pharynx: No oropharyngeal exudate.  Eyes:     General: No scleral icterus.       Right eye: No discharge.        Left eye: No discharge.     Conjunctiva/sclera:  Conjunctivae normal.     Pupils: Pupils are equal, round, and reactive to light.  Neck:     Thyroid: No thyromegaly.     Vascular: No JVD.  Cardiovascular:     Rate and Rhythm: Normal rate and  regular rhythm.     Heart sounds: Normal heart sounds. No murmur heard.  No friction rub. No gallop.   Pulmonary:     Effort: Pulmonary effort is normal. No respiratory distress.     Breath sounds: Normal breath sounds. No wheezing or rales.  Abdominal:     General: Bowel sounds are normal. There is no distension.     Palpations: Abdomen is soft. There is no mass.     Tenderness: There is no abdominal tenderness.  Musculoskeletal:        General: No tenderness. Normal range of motion.     Cervical back: Normal range of motion and neck supple.  Lymphadenopathy:     Cervical: No cervical adenopathy.  Skin:    General: Skin is warm and dry.     Findings: No erythema or rash.  Neurological:     General: No focal deficit present.     Mental Status: He is alert.     Coordination: Coordination normal.  Psychiatric:        Behavior: Behavior normal.     ED Results / Procedures / Treatments   Labs (all labs ordered are listed, but only abnormal results are displayed) Labs Reviewed  CBC WITH DIFFERENTIAL/PLATELET - Abnormal; Notable for the following components:      Result Value   WBC 19.8 (*)    Neutro Abs 15.5 (*)    Monocytes Absolute 1.9 (*)    Abs Immature Granulocytes 0.10 (*)    All other components within normal limits  COMPREHENSIVE METABOLIC PANEL - Abnormal; Notable for the following components:   Glucose, Bld 108 (*)    BUN 5 (*)    All other components within normal limits    EKG None  Radiology CT Maxillofacial W Contrast  Result Date: 08/10/2020 CLINICAL DATA:  Right-sided facial pain.  Possible abscess. EXAM: CT MAXILLOFACIAL WITH CONTRAST TECHNIQUE: Multidetector CT imaging of the maxillofacial structures was performed with intravenous contrast. Multiplanar CT  image reconstructions were also generated. CONTRAST:  75mL OMNIPAQUE IOHEXOL 300 MG/ML  SOLN COMPARISON:  None. FINDINGS: Osseous: No fracture or mandibular dislocation. No destructive process. Orbits: Negative. No traumatic or inflammatory finding. Sinuses: Minimal scattered mucosal thickening, including inferior maxillary sinuses and scattered ethmoid air cells. No air-fluid levels. Soft tissues: There is soft tissue stranding and phlegmonous change overlying the right maxilla with stranding of the overlying subcutaneous fat. Stranding extends into the imaged upper right neck. Prominent right-sided lymph nodes, likely reactive. No evidence of suppuration. Limited intracranial: No significant or unexpected finding. Dental: Scattered periapical lucencies, including periapical lucency of the right maxillary second premolar. IMPRESSION: 1. Findings consistent with phlegmonous change and cellulitis of the right face and upper neck, centered over the right maxilla. No discrete drainable abscess identified. 2. While nonspecific, findings may be odontogenic in origin. There is a periapical lucencies of the right maxillary second premolar without cortical dehiscence. Electronically Signed   By: Feliberto HartsFrederick S Jones MD   On: 08/10/2020 13:48    Procedures Procedures (including critical care time)  Medications Ordered in ED Medications  0.9 %  sodium chloride infusion ( Intravenous New Bag/Given 08/10/20 1203)  vancomycin (VANCOCIN) IVPB 1000 mg/200 mL premix (0 mg Intravenous Stopped 08/10/20 1348)  iohexol (OMNIPAQUE) 300 MG/ML solution 75 mL (75 mLs Intravenous Contrast Given 08/10/20 1325)    ED Course  I have reviewed the triage vital signs and the nursing notes.  Pertinent labs & imaging results that were available during my care of  the patient were reviewed by me and considered in my medical decision making (see chart for details).    MDM Rules/Calculators/A&P                          On finger  digital palpation on the internal jaw I can feel some tenderness along the gingiva of both lower and upper jaw, the external facial swelling is suggestive of an odontogenic source though I would also consider parotitis, I suspect given the location of these they may involve the masseter or other deep tissues and thus he does need a CT scan.  He has already been given Rocephin.  Will place an IV, make the patient n.p.o., anticipate admission  The patient has a leukocytosis of nearly 20,000 with leftward shift, the metabolic panel is unremarkable.  His CT scan does show that he has a phlegmon type infection in the side of his face, there is no deep tissue abscesses, I suspect that the trismus is related to the skin and soft tissue infection that is infiltrating near the masseter muscles with spasm..  I am concerned that the patient if he goes home would have difficulty eating or drinking and that this infection may get worse thus IV antibiotics and IV fluids may be reasonable.  The patient is agreeable to admission, vancomycin has been given, discussed with the hospitalist who will admit.  Final Clinical Impression(s) / ED Diagnoses Final diagnoses:  Facial cellulitis    Rx / DC Orders ED Discharge Orders    None       Eber Hong, MD 08/10/20 1432

## 2020-08-10 NOTE — Patient Instructions (Signed)
You had the antibiotic shot today (rocephin)  Please to go the Emergency Room NOW  Please continue all other medications as before, and refills have been done if requested.  Please have the pharmacy call with any other refills you may need.  Please keep your appointments with your specialists as you may have planned

## 2020-08-11 DIAGNOSIS — D72825 Bandemia: Secondary | ICD-10-CM | POA: Diagnosis not present

## 2020-08-11 DIAGNOSIS — L03211 Cellulitis of face: Secondary | ICD-10-CM | POA: Diagnosis not present

## 2020-08-11 DIAGNOSIS — F17200 Nicotine dependence, unspecified, uncomplicated: Secondary | ICD-10-CM

## 2020-08-11 DIAGNOSIS — I10 Essential (primary) hypertension: Secondary | ICD-10-CM | POA: Diagnosis not present

## 2020-08-11 LAB — CBC
HCT: 41 % (ref 39.0–52.0)
Hemoglobin: 13.7 g/dL (ref 13.0–17.0)
MCH: 31.3 pg (ref 26.0–34.0)
MCHC: 33.4 g/dL (ref 30.0–36.0)
MCV: 93.6 fL (ref 80.0–100.0)
Platelets: 270 10*3/uL (ref 150–400)
RBC: 4.38 MIL/uL (ref 4.22–5.81)
RDW: 13.6 % (ref 11.5–15.5)
WBC: 16.8 10*3/uL — ABNORMAL HIGH (ref 4.0–10.5)
nRBC: 0 % (ref 0.0–0.2)

## 2020-08-11 LAB — RAPID URINE DRUG SCREEN, HOSP PERFORMED
Amphetamines: NOT DETECTED
Barbiturates: NOT DETECTED
Benzodiazepines: POSITIVE — AB
Cocaine: NOT DETECTED
Opiates: NOT DETECTED
Tetrahydrocannabinol: NOT DETECTED

## 2020-08-11 LAB — BASIC METABOLIC PANEL
Anion gap: 9 (ref 5–15)
BUN: 6 mg/dL (ref 6–20)
CO2: 23 mmol/L (ref 22–32)
Calcium: 9.6 mg/dL (ref 8.9–10.3)
Chloride: 102 mmol/L (ref 98–111)
Creatinine, Ser: 0.95 mg/dL (ref 0.61–1.24)
GFR, Estimated: >60 mL/min (ref >60)
Glucose, Bld: 150 mg/dL — ABNORMAL HIGH (ref 70–99)
Potassium: 4.1 mmol/L (ref 3.5–5.1)
Sodium: 134 mmol/L — ABNORMAL LOW (ref 135–145)

## 2020-08-11 MED ORDER — NAPROXEN 500 MG PO TABS: 500.0000 mg | ORAL_TABLET | Freq: Two times a day (BID) | ORAL | 0 refills | Status: AC

## 2020-08-11 MED ORDER — OXYCODONE-ACETAMINOPHEN 5-325 MG PO TABS: 1.0000 | ORAL_TABLET | Freq: Four times a day (QID) | ORAL | 0 refills | Status: AC | PRN

## 2020-08-11 MED ORDER — ALPRAZOLAM 0.5 MG PO TABS
1.0000 mg | ORAL_TABLET | Freq: Three times a day (TID) | ORAL | Status: DC | PRN
  Administered 2020-08-11: 1 mg via ORAL
  Filled 2020-08-11: qty 2

## 2020-08-11 MED ORDER — HYDROMORPHONE HCL 1 MG/ML IJ SOLN
0.5000 mg | INTRAMUSCULAR | Status: DC | PRN
  Administered 2020-08-11: 0.5 mg via INTRAVENOUS
  Filled 2020-08-11: qty 1

## 2020-08-11 MED ORDER — AMOXICILLIN-POT CLAVULANATE 875-125 MG PO TABS: 1.0000 | ORAL_TABLET | Freq: Two times a day (BID) | ORAL | 0 refills | Status: AC

## 2020-08-11 MED ORDER — OXYCODONE HCL 5 MG PO TABS
5.0000 mg | ORAL_TABLET | Freq: Once | ORAL | Status: AC
  Administered 2020-08-11: 5 mg via ORAL
  Filled 2020-08-11: qty 1

## 2020-08-11 NOTE — Discharge Summary (Signed)
Physician Discharge Summary  Tanner RyderLarry Gomez Gomez BJY:[id number] DOB: [birth year] DOA: 08/10/2020  PCP: Tanner LevinsJohn, Tanner W, MD  Admit date: 08/10/2020 Discharge date: 08/11/2020  Admitted From: Home Disposition: Home  Recommendations for Outpatient Follow-up:  1. Follow ups as below. 2. Please obtain CBC/BMP/Mag at follow up 3. Please follow up on the following pending results: None  Home Health: None required Equipment/Devices: None required  Discharge Condition: Stable CODE STATUS: Full code   Follow-up Information    Tanner LevinsJohn, Tanner W, MD. Schedule an appointment as soon as possible for a visit in 1 week(s).   Specialties: Internal Medicine, Radiology Contact information: [address] [place] [id number] [phone]                Hospital Course: 54 year old male with history of HTN, HLD, tobacco use, anxiety, depression and GERD presenting with 3 days of right facial swelling and pain and trismus.  He was admitted for right facial cellulitis and was started on IV Unasyn.  He was hemodynamically stable.  Afebrile.  Has leukocytosis.  CT facial study revealed phlegmonous changes and cellulitis of the right face and upper neck centered over right maxillary without an abscess, and periapical looseness of the right maxillary second premolar.   The next day, some improvement in his pain and trismus.   He was able to take in some full liquid diet.  Leukocytosis improved. Blood cultures NGTD.  He was discharged on p.o. Augmentin for 10 more days.  Was given prescription for Percocet 5/325.  Patient was encouraged to follow-up with PCP and his dentist.  He was also counseled on tobacco smoking cessation and provided with resources.  See individual problem list below for more hospital course.  Discharge Diagnoses:  Right facial cellulitis-improved with IV Unasyn.  Blood cultures NGTD.  Discharged on p.o. Augmentin for 10 more days. -Rx'd Percocet 5/325, #20 for pain. -Recommended  outpatient follow-up with PCP and dentist. -Counseled on tobacco smoking cessation and provided with resources.  Essential hypertension/HLD/GERD: Stable -Continue home medications.  Tobacco use disorder: Cessation counseling and resources provided.  Leukocytosis: Likely due to #1.  Improved. -Recheck at follow-up.  Class I obesity Body mass index is 32.95 kg/m.  -Encourage lifestyle change to lose weight.          Discharge Exam: Vitals:   08/10/20 2335 08/11/20 0623  BP: 136/76 (!) 141/83  Pulse: 63 74  Resp: 18 18  Temp: 97.8 F (36.6 C) 97.8 F (36.6 C)  SpO2: 95% 97%    GENERAL: No apparent distress.  Nontoxic. HEENT: MMM.  Mild trismus.  Some swelling and tenderness over right face to his right neck. NECK: Supple.  Full range of motion in his neck. RESP: On room air.  No IWOB.  Fair aeration bilaterally. CVS:  RRR. Heart sounds normal.  ABD/GI/GU: Bowel sounds present. Soft. Non tender.  MSK/EXT:  Moves extremities. No apparent deformity. No edema.  SKIN: no apparent skin lesion or wound NEURO: Awake, alert and oriented appropriately.  No apparent focal neuro deficit. PSYCH: Calm. Normal affect.  Discharge Instructions  Discharge Instructions    Call MD for:  redness, tenderness, or signs of infection (pain, swelling, redness, odor or green/yellow discharge around incision site)   Complete by: As directed    Call MD for:  severe uncontrolled pain   Complete by: As directed    Call MD for:  temperature >100.4   Complete by: As directed    Diet - low sodium heart  healthy   Complete by: As directed    Discharge instructions   Complete by: As directed    It has been a pleasure taking care of you!  You were hospitalized due to cellulitis (infection of your right face).  We have started you on antibiotics.  We are discharging you on more antibiotics to complete treatment course.  Please follow-up with your primary care doctor in 1 week.  We also recommend  follow-up with dentist as soon as possible.  It is important that you quit smoking cigarettes.  You may use nicotine patch to help you quit smoking.  Nicotine patch is available over-the-counter.  You may also discuss other options to help you quit smoking with your primary care doctor. You can also talk to professional counselors at 1-800-QUIT-NOW 951-293-6898) for free smoking cessation counseling.    Take care,   Increase activity slowly   Complete by: As directed      Allergies as of 08/11/2020      Reactions   Sular Swelling      Medication List    STOP taking these medications   niacin 1000 MG CR tablet Commonly known as: NIASPAN     TAKE these medications   ALPRAZolam 1 MG tablet Commonly known as: XANAX TAKE 1 TABLET BY MOUTH THREE TIMES A DAY AS NEEDED FOR ANXIETY What changed: See the new instructions.   amLODipine 10 MG tablet Commonly known as: NORVASC Take 1 tablet (10 mg total) by mouth daily.   amoxicillin-clavulanate 875-125 MG tablet Commonly known as: Augmentin Take 1 tablet by mouth 2 (two) times daily for 10 days.   atenolol 100 MG tablet Commonly known as: TENORMIN TAKE 1 TABLET BY MOUTH DAILY. MUST KEEP SCHEDULE APPT Gomez/NEW PROVIDER FOR FUTURE REFILLS What changed:   how much to take  how to take this  when to take this   atorvastatin 80 MG tablet Commonly known as: LIPITOR Take 1 tablet (80 mg total) by mouth daily.   Coenzyme Q10 100 MG capsule Take 100 mg by mouth daily.   fenofibrate 160 MG tablet Take 1 tablet (160 mg total) by mouth daily.   Fish Oil 1000 MG Caps Take 4 capsules by mouth daily.   Magnesium 400 MG Tabs Take 400 mg by mouth daily.   naproxen 500 MG tablet Commonly known as: Naprosyn Take 1 tablet (500 mg total) by mouth 2 (two) times daily with a meal for 5 days.   NON FORMULARY Take 1 tablet by mouth daily. Full Spectrum Mineral Caps   omeprazole 40 MG capsule Commonly known as: PRILOSEC Take 1  capsule (40 mg total) by mouth daily.   oxyCODONE-acetaminophen 5-325 MG tablet Commonly known as: Percocet Take 1 tablet by mouth every 6 (six) hours as needed for up to 5 days for severe pain.   PRESCRIPTION MEDICATION Pt uses CPAP Machine   Vitamin D 50 MCG (2000 UT) tablet Take 2,000 Units by mouth daily.       Consultations:  None  Procedures/Studies:   CT Maxillofacial Gomez Contrast  Result Date: 08/10/2020 CLINICAL DATA:  Right-sided facial pain.  Possible abscess. EXAM: CT MAXILLOFACIAL WITH CONTRAST TECHNIQUE: Multidetector CT imaging of the maxillofacial structures was performed with intravenous contrast. Multiplanar CT image reconstructions were also generated. CONTRAST:  76mL OMNIPAQUE IOHEXOL 300 MG/ML  SOLN COMPARISON:  None. FINDINGS: Osseous: No fracture or mandibular dislocation. No destructive process. Orbits: Negative. No traumatic or inflammatory finding. Sinuses: Minimal scattered mucosal thickening, including inferior maxillary  sinuses and scattered ethmoid air cells. No air-fluid levels. Soft tissues: There is soft tissue stranding and phlegmonous change overlying the right maxilla with stranding of the overlying subcutaneous fat. Stranding extends into the imaged upper right neck. Prominent right-sided lymph nodes, likely reactive. No evidence of suppuration. Limited intracranial: No significant or unexpected finding. Dental: Scattered periapical lucencies, including periapical lucency of the right maxillary second premolar. IMPRESSION: 1. Findings consistent with phlegmonous change and cellulitis of the right face and upper neck, centered over the right maxilla. No discrete drainable abscess identified. 2. While nonspecific, findings may be odontogenic in origin. There is a periapical lucencies of the right maxillary second premolar without cortical dehiscence. Electronically Signed   By: Feliberto Harts MD   On: 08/10/2020 13:48        The results of  significant diagnostics from this hospitalization (including imaging, microbiology, ancillary and laboratory) are listed below for reference.     Microbiology: Recent Results (from the past 240 hour(s))  Respiratory Panel by RT PCR (Flu A&B, Covid) - Nasopharyngeal Swab     Status: None   Collection Time: 08/10/20  4:34 PM   Specimen: Nasopharyngeal Swab  Result Value Ref Range Status   SARS Coronavirus 2 by RT PCR NEGATIVE NEGATIVE Final    Comment: (NOTE) SARS-CoV-2 target nucleic acids are NOT DETECTED.  The SARS-CoV-2 RNA is generally detectable in upper respiratoy specimens during the acute phase of infection. The lowest concentration of SARS-CoV-2 viral copies this assay can detect is 131 copies/mL. A negative result does not preclude SARS-Cov-2 infection and should not be used as the sole basis for treatment or other patient management decisions. A negative result may occur with  improper specimen collection/handling, submission of specimen other than nasopharyngeal swab, presence of viral mutation(s) within the areas targeted by this assay, and inadequate number of viral copies (<131 copies/mL). A negative result must be combined with clinical observations, patient history, and epidemiological information. The expected result is Negative.  Fact Sheet for Patients:  https://www.moore.com/  Fact Sheet for Healthcare Providers:  https://www.young.biz/  This test is no t yet approved or cleared by the [place] FDA and  has been authorized for detection and/or diagnosis of SARS-CoV-2 by FDA under an Emergency Use Authorization (EUA). This EUA will remain  in effect (meaning this test can be used) for the duration of the COVID-19 declaration under Section 564(b)(1) of the Act, 21 U.S.C. section 360bbb-3(b)(1), unless the authorization is terminated or revoked sooner.     Influenza A by PCR NEGATIVE NEGATIVE Final   Influenza B by  PCR NEGATIVE NEGATIVE Final    Comment: (NOTE) The Xpert Xpress SARS-CoV-2/FLU/RSV assay is intended as an aid in  the diagnosis of influenza from Nasopharyngeal swab specimens and  should not be used as a sole basis for treatment. Nasal washings and  aspirates are unacceptable for Xpert Xpress SARS-CoV-2/FLU/RSV  testing.  Fact Sheet for Patients: https://www.moore.com/  Fact Sheet for Healthcare Providers: https://www.young.biz/  This test is not yet approved or cleared by the [place] FDA and  has been authorized for detection and/or diagnosis of SARS-CoV-2 by  FDA under an Emergency Use Authorization (EUA). This EUA will remain  in effect (meaning this test can be used) for the duration of the  Covid-19 declaration under Section 564(b)(1) of the Act, 21  U.S.C. section 360bbb-3(b)(1), unless the authorization is  terminated or revoked. Performed at [facility], [address], [place], [place] [address]  Culture, blood (routine x 2)     Status: None (Preliminary result)   Collection Time: 08/10/20  4:45 PM   Specimen: BLOOD LEFT HAND  Result Value Ref Range Status   Specimen Description BLOOD LEFT HAND  Final   Special Requests   Final    BOTTLES DRAWN AEROBIC AND ANAEROBIC Blood Culture results may not be optimal due to an inadequate volume of blood received in culture bottles   Culture   Final    NO GROWTH < 12 HOURS Performed at [facility], [address], [place], [place] [address]    Report Status PENDING  Incomplete     Labs: BNP (last 3 results) No results for input(s): BNP in the last 8760 hours. Basic Metabolic Panel: Recent Labs  Lab 08/10/20 1137 08/11/20 0031  NA 137 134*  K 4.0 4.1  CL 102 102  CO2 26 23  GLUCOSE 108* 150*  BUN 5* 6  CREATININE 1.02 0.95  CALCIUM 9.7 9.6   Liver Function Tests: Recent Labs  Lab 08/10/20 1137  AST 20  ALT 19  ALKPHOS 48  BILITOT 0.7  PROT 7.9   ALBUMIN 3.9   No results for input(s): LIPASE, AMYLASE in the last 168 hours. No results for input(s): AMMONIA in the last 168 hours. CBC: Recent Labs  Lab 08/10/20 1137 08/11/20 0031  WBC 19.8* 16.8*  NEUTROABS 15.5*  --   HGB 14.7 13.7  HCT 44.0 41.0  MCV 93.8 93.6  PLT 265 270   Cardiac Enzymes: No results for input(s): CKTOTAL, CKMB, CKMBINDEX, TROPONINI in the last 168 hours. BNP: Invalid input(s): POCBNP CBG: No results for input(s): GLUCAP in the last 168 hours. D-Dimer No results for input(s): DDIMER in the last 72 hours. Hgb A1c No results for input(s): HGBA1C in the last 72 hours. Lipid Profile No results for input(s): CHOL, HDL, LDLCALC, TRIG, CHOLHDL, LDLDIRECT in the last 72 hours. Thyroid function studies No results for input(s): TSH, T4TOTAL, T3FREE, THYROIDAB in the last 72 hours.  Invalid input(s): FREET3 Anemia work up No results for input(s): VITAMINB12, FOLATE, FERRITIN, TIBC, IRON, RETICCTPCT in the last 72 hours. Urinalysis    Component Value Date/Time   COLORURINE YELLOW 12/01/2019 1629   APPEARANCEUR CLEAR 12/01/2019 1629   LABSPEC <=1.005 (A) 12/01/2019 1629   PHURINE 7.0 12/01/2019 1629   GLUCOSEU NEGATIVE 12/01/2019 1629   HGBUR NEGATIVE 12/01/2019 1629   BILIRUBINUR NEGATIVE 12/01/2019 1629   BILIRUBINUR neg 12/30/2014 1456   KETONESUR NEGATIVE 12/01/2019 1629   PROTEINUR trace 12/30/2014 1456   PROTEINUR 30 (A) 07/07/2009 1412   UROBILINOGEN 0.2 12/01/2019 1629   NITRITE NEGATIVE 12/01/2019 1629   LEUKOCYTESUR NEGATIVE 12/01/2019 1629   Sepsis Labs Invalid input(s): PROCALCITONIN,  WBC,  LACTICIDVEN   Time coordinating discharge: 35 minutes  SIGNED:  Almon Hercules, MD  [facility] 08/11/2020, 11:11 AM  If 7PM-7AM, please contact night-coverage [email or web address]

## 2020-08-13 LAB — ANTISTREPTOLYSIN O TITER: ASO: 75 IU/mL (ref 0.0–200.0)

## 2020-08-15 ENCOUNTER — Other Ambulatory Visit: Payer: Self-pay | Admitting: Internal Medicine

## 2020-08-15 LAB — CULTURE, BLOOD (ROUTINE X 2): Culture: NO GROWTH

## 2020-08-16 LAB — CULTURE, BLOOD (ROUTINE X 2)
Culture: NO GROWTH
Special Requests: ADEQUATE

## 2020-08-18 ENCOUNTER — Encounter: Payer: Self-pay | Admitting: Internal Medicine

## 2020-08-18 ENCOUNTER — Ambulatory Visit: Payer: Commercial Managed Care - PPO | Admitting: Internal Medicine

## 2020-08-18 ENCOUNTER — Other Ambulatory Visit: Payer: Self-pay

## 2020-08-18 VITALS — BP 130/80 | HR 63 | Temp 98.9°F | Ht 69.0 in | Wt 222.0 lb

## 2020-08-18 DIAGNOSIS — I10 Essential (primary) hypertension: Secondary | ICD-10-CM

## 2020-08-18 DIAGNOSIS — R739 Hyperglycemia, unspecified: Secondary | ICD-10-CM | POA: Diagnosis not present

## 2020-08-18 DIAGNOSIS — L03211 Cellulitis of face: Secondary | ICD-10-CM

## 2020-08-18 NOTE — Patient Instructions (Addendum)
Please consider making appt with Dr Elder Cyphers DDS for dental exam  Ok to stop the niacin and omega 3s  Please continue all other medications as before, and refills have been done if requested.  Please have the pharmacy call with any other refills you may need.  Please continue your efforts at being more active, low cholesterol diet, and weight control.  Please keep your appointments with your specialists as you may have planned  Please make an Appointment to return in Feb 2022 for your yearly exam

## 2020-08-18 NOTE — Progress Notes (Signed)
Subjective:    Patient ID: Tanner Gomez, male    DOB: 10-Jun-1966, 54 y.o.   MRN: [id number]  HPI  Here to f/u post hospn for right face and neck cellulitis, overall much improved without fever, and near resolving right facial and neck pain, swelling, redness.  No new complaints. Finishing antibx. Pt denies chest pain, increased sob or doe, wheezing, orthopnea, PND, increased LE swelling, palpitations, dizziness or syncope.  Pt denies new neurological symptoms such as new headache, or facial or extremity weakness or numbness   Pt denies polydipsia, polyuria.  Asking for dentist recommendation as original infection thought odontogenic Past Medical History:  Diagnosis Date  . Depression   . Hyperlipidemia   . Hypertension   . Pancreatitis   . Tobacco abuse    Past Surgical History:  Procedure Laterality Date  . EYE SURGERY     RIGHT (CHILDHOOD)  . JOINT REPLACEMENT      reports that he has been smoking cigarettes. He has a 37.00 pack-year smoking history. He has never used smokeless tobacco. He reports current alcohol use of about 33.0 standard drinks of alcohol per week. He reports that he does not use drugs. family history includes Diabetes in his father; Heart attack in his maternal grandmother, mother, and paternal grandfather; Heart disease in his mother; Hypertension in his father and mother; Kidney Stones in his sister; Kidney disease in his mother. Allergies  Allergen Reactions  . Sular Swelling   Current Outpatient Medications on File Prior to Visit  Medication Sig Dispense Refill  . ALPRAZolam (XANAX) 1 MG tablet TAKE 1 TABLET BY MOUTH THREE TIMES A DAY AS NEEDED FOR ANXIETY (Patient taking differently: Take 1 mg by mouth 3 (three) times daily as needed for anxiety. TAKE 1 TABLET BY MOUTH THREE TIMES A DAY AS NEEDED FOR ANXIETY) 90 tablet 2  . amLODipine (NORVASC) 10 MG tablet Take 1 tablet (10 mg total) by mouth daily. 90 tablet 2  . amoxicillin-clavulanate (AUGMENTIN) 875-125  MG tablet Take 1 tablet by mouth 2 (two) times daily for 10 days. 20 tablet 0  . atenolol (TENORMIN) 100 MG tablet TAKE 1 TABLET BY MOUTH DAILY. MUST KEEP SCHEDULE APPT W/NEW PROVIDER FOR FUTURE REFILLS (Patient taking differently: Take 100 mg by mouth daily. TAKE 1 TABLET BY MOUTH DAILY. MUST KEEP SCHEDULE APPT W/NEW PROVIDER FOR FUTURE REFILLS) 90 tablet 3  . atorvastatin (LIPITOR) 80 MG tablet Take 1 tablet (80 mg total) by mouth daily. 90 tablet 2  . Cholecalciferol (VITAMIN D) 50 MCG (2000 UT) tablet Take 2,000 Units by mouth daily.    . Coenzyme Q10 100 MG capsule Take 100 mg by mouth daily.     . fenofibrate 160 MG tablet Take 1 tablet (160 mg total) by mouth daily. 30 tablet 11  . Magnesium 400 MG TABS Take 400 mg by mouth daily.     . NON FORMULARY Take 1 tablet by mouth daily. Full Spectrum Mineral Caps     . Omega-3 Fatty Acids (FISH OIL) 1000 MG CAPS Take 4 capsules by mouth daily.    Marland Kitchen omeprazole (PRILOSEC) 40 MG capsule Take 1 capsule (40 mg total) by mouth daily. 90 capsule 2  . PRESCRIPTION MEDICATION Pt uses CPAP Machine    . VASCEPA 1 g capsule Take 2 g by mouth 2 (two) times daily.     No current facility-administered medications on file prior to visit.   Review of Systems All otherwise neg per pt  Objective:   Physical Exam BP 130/80 (BP Location: Left Arm, Patient Position: Sitting, Cuff Size: Large)   Pulse 63   Temp 98.9 F (37.2 C) (Oral)   Ht 5\' 9"  (1.753 m)   Wt 222 lb (100.7 kg)   SpO2 96%   BMI 32.78 kg/m  VS noted,  Constitutional: Pt appears in NAD HENT: Head: NCAT.  Right Ear: External ear normal.  Left Ear: External ear normal.  Eyes: . Pupils are equal, round, and reactive to light. Conjunctivae and EOM are normal Nose: without d/c or deformity Neck: Neck supple. Gross normal ROM Cardiovascular: Normal rate and regular rhythm.   Pulmonary/Chest: Effort normal and breath sounds without rales or wheezing.  Abd:  Soft, NT, ND, + BS, no  organomegaly Neurological: Pt is alert. At baseline orientation, motor grossly intact Skin: Skin is warm. No rashes, other new lesions, no LE edema Psychiatric: Pt behavior is normal without agitation  All otherwise neg per pt Lab Results  Component Value Date   WBC 16.8 (H) 08/11/2020   HGB 13.7 08/11/2020   HCT 41.0 08/11/2020   PLT 270 08/11/2020   GLUCOSE 150 (H) 08/11/2020   CHOL 160 04/28/2020   TRIG 304 (H) 04/28/2020   HDL 19 (L) 04/28/2020   LDLDIRECT 89 04/28/2020   LDLCALC 90 04/28/2020   ALT 19 08/10/2020   AST 20 08/10/2020   NA 134 (L) 08/11/2020   K 4.1 08/11/2020   CL 102 08/11/2020   CREATININE 0.95 08/11/2020   BUN 6 08/11/2020   CO2 23 08/11/2020   TSH 5.27 (H) 12/01/2019   PSA 0.13 12/01/2019   INR 1.61 (H) 03/07/2011   HGBA1C 6.1 12/01/2019      Assessment & Plan:

## 2020-08-21 ENCOUNTER — Encounter: Payer: Self-pay | Admitting: Internal Medicine

## 2020-08-21 NOTE — Assessment & Plan Note (Signed)
stable overall by history and exam, recent data reviewed with pt, and pt to continue medical treatment as before,  to f/u any worsening symptoms or concerns  

## 2020-08-21 NOTE — Assessment & Plan Note (Addendum)
Near resolved, to finish antibx,  to f/u any worsening symptoms or concerns  I spent 31 minutes in preparing to see the patient by review of recent labs, imaging and procedures, obtaining and reviewing separately obtained history, communicating with the patient and family or caregiver, ordering medications, tests or procedures, and documenting clinical information in the EHR including the differential Dx, treatment, and any further evaluation and other management of facial cellulitis, hypeglycemia, htn

## 2020-09-02 ENCOUNTER — Other Ambulatory Visit: Payer: Self-pay | Admitting: Internal Medicine

## 2020-09-02 NOTE — Telephone Encounter (Signed)
Done erx 

## 2020-09-02 NOTE — Telephone Encounter (Signed)
LVM informing pt that his prescription has been filled to pharmacy on file & to call office for questions.

## 2020-10-05 ENCOUNTER — Other Ambulatory Visit: Payer: Self-pay | Admitting: *Deleted

## 2020-10-05 DIAGNOSIS — E782 Mixed hyperlipidemia: Secondary | ICD-10-CM

## 2020-11-16 ENCOUNTER — Other Ambulatory Visit: Payer: Self-pay | Admitting: Internal Medicine

## 2020-11-16 DIAGNOSIS — K219 Gastro-esophageal reflux disease without esophagitis: Secondary | ICD-10-CM

## 2020-11-16 DIAGNOSIS — I10 Essential (primary) hypertension: Secondary | ICD-10-CM

## 2020-11-16 NOTE — Telephone Encounter (Signed)
Please refill as per office routine med refill policy (all routine meds refilled for 3 mo or monthly per pt preference up to one year from last visit, then month to month grace period for 3 mo, then further med refills will have to be denied)  

## 2020-12-08 ENCOUNTER — Other Ambulatory Visit: Payer: Self-pay

## 2020-12-09 ENCOUNTER — Encounter: Payer: Self-pay | Admitting: Internal Medicine

## 2020-12-09 ENCOUNTER — Ambulatory Visit (INDEPENDENT_AMBULATORY_CARE_PROVIDER_SITE_OTHER): Payer: Commercial Managed Care - PPO | Admitting: Internal Medicine

## 2020-12-09 VITALS — BP 126/78 | HR 66 | Ht 69.0 in | Wt 227.0 lb

## 2020-12-09 DIAGNOSIS — R7989 Other specified abnormal findings of blood chemistry: Secondary | ICD-10-CM | POA: Diagnosis not present

## 2020-12-09 DIAGNOSIS — Z0000 Encounter for general adult medical examination without abnormal findings: Secondary | ICD-10-CM | POA: Diagnosis not present

## 2020-12-09 DIAGNOSIS — I10 Essential (primary) hypertension: Secondary | ICD-10-CM | POA: Diagnosis not present

## 2020-12-09 DIAGNOSIS — E538 Deficiency of other specified B group vitamins: Secondary | ICD-10-CM | POA: Diagnosis not present

## 2020-12-09 DIAGNOSIS — E559 Vitamin D deficiency, unspecified: Secondary | ICD-10-CM

## 2020-12-09 DIAGNOSIS — R739 Hyperglycemia, unspecified: Secondary | ICD-10-CM | POA: Diagnosis not present

## 2020-12-09 DIAGNOSIS — Z0001 Encounter for general adult medical examination with abnormal findings: Secondary | ICD-10-CM

## 2020-12-09 DIAGNOSIS — Z720 Tobacco use: Secondary | ICD-10-CM

## 2020-12-09 DIAGNOSIS — E781 Pure hyperglyceridemia: Secondary | ICD-10-CM

## 2020-12-09 LAB — URINALYSIS, ROUTINE W REFLEX MICROSCOPIC
Bilirubin Urine: NEGATIVE
Hgb urine dipstick: NEGATIVE
Ketones, ur: NEGATIVE
Leukocytes,Ua: NEGATIVE
Nitrite: NEGATIVE
Specific Gravity, Urine: <=1.005 — AB (ref 1.000–1.030)
Total Protein, Urine: NEGATIVE
Urine Glucose: NEGATIVE
Urobilinogen, UA: 0.2 (ref 0.0–1.0)
pH: 6.5 (ref 5.0–8.0)

## 2020-12-09 LAB — BASIC METABOLIC PANEL
BUN: 12 mg/dL (ref 6–23)
CO2: 27 mEq/L (ref 19–32)
Calcium: 9.8 mg/dL (ref 8.4–10.5)
Chloride: 97 mEq/L (ref 96–112)
Creatinine, Ser: 1.02 mg/dL (ref 0.40–1.50)
GFR: 82.85 mL/min (ref >60.00)
Glucose, Bld: 91 mg/dL (ref 70–99)
Potassium: 4.4 mEq/L (ref 3.5–5.1)
Sodium: 132 mEq/L — ABNORMAL LOW (ref 135–145)

## 2020-12-09 LAB — LIPID PANEL
Cholesterol: 165 mg/dL (ref 0–200)
HDL: 16.4 mg/dL — ABNORMAL LOW (ref >39.00)
NonHDL: 148.4
Total CHOL/HDL Ratio: 10
Triglycerides: 285 mg/dL — ABNORMAL HIGH (ref 0.0–149.0)
VLDL: 57 mg/dL — ABNORMAL HIGH (ref 0.0–40.0)

## 2020-12-09 LAB — VITAMIN B12: Vitamin B-12: 222 pg/mL (ref 211–911)

## 2020-12-09 LAB — CBC WITH DIFFERENTIAL/PLATELET
Basophils Absolute: 0.1 10*3/uL (ref 0.0–0.1)
Basophils Relative: 0.9 % (ref 0.0–3.0)
Eosinophils Absolute: 0.3 10*3/uL (ref 0.0–0.7)
Eosinophils Relative: 2.2 % (ref 0.0–5.0)
HCT: 44.4 % (ref 39.0–52.0)
Hemoglobin: 15 g/dL (ref 13.0–17.0)
Lymphocytes Relative: 19.7 % (ref 12.0–46.0)
Lymphs Abs: 2.4 10*3/uL (ref 0.7–4.0)
MCHC: 33.8 g/dL (ref 30.0–36.0)
MCV: 90.7 fl (ref 78.0–100.0)
Monocytes Absolute: 1.1 10*3/uL — ABNORMAL HIGH (ref 0.1–1.0)
Monocytes Relative: 8.8 % (ref 3.0–12.0)
Neutro Abs: 8.5 10*3/uL — ABNORMAL HIGH (ref 1.4–7.7)
Neutrophils Relative %: 68.4 % (ref 43.0–77.0)
Platelets: 241 10*3/uL (ref 150.0–400.0)
RBC: 4.9 Mil/uL (ref 4.22–5.81)
RDW: 13.5 % (ref 11.5–15.5)
WBC: 12.4 10*3/uL — ABNORMAL HIGH (ref 4.0–10.5)

## 2020-12-09 LAB — HEPATIC FUNCTION PANEL
ALT: 37 U/L (ref 0–53)
AST: 47 U/L — ABNORMAL HIGH (ref 0–37)
Albumin: 4.3 g/dL (ref 3.5–5.2)
Alkaline Phosphatase: 40 U/L (ref 39–117)
Bilirubin, Direct: 0.1 mg/dL (ref 0.0–0.3)
Total Bilirubin: 0.8 mg/dL (ref 0.2–1.2)
Total Protein: 7.5 g/dL (ref 6.0–8.3)

## 2020-12-09 LAB — VITAMIN D 25 HYDROXY (VIT D DEFICIENCY, FRACTURES): VITD: 39.02 ng/mL (ref 30.00–100.00)

## 2020-12-09 LAB — HEMOGLOBIN A1C: Hgb A1c MFr Bld: 5.9 % (ref 4.6–6.5)

## 2020-12-09 LAB — PSA: PSA: 0.12 ng/mL (ref 0.10–4.00)

## 2020-12-09 LAB — LDL CHOLESTEROL, DIRECT: Direct LDL: 100 mg/dL

## 2020-12-09 LAB — TSH: TSH: 3.08 u[IU]/mL (ref 0.35–4.50)

## 2020-12-09 NOTE — Progress Notes (Signed)
Patient ID: Tanner Gomez, male   DOB: Jul 13, 1966, 55 y.o.   MRN: [id number]         Chief Complaint:: wellness exam and f/u hyperglycemia, low vit d, and elevated tsh       HPI:  Tanner Gomez is a 55 y.o. male here for wellness exam; due for covid booster but not yet decided to have done yet.  O/w up to date with immunization and preventive referrals.                        Also taking vit d well.   Pt denies polydipsia, polyuria, Denies hyper or hypo thyroid symptoms such as voice, skin or hair change.  Pt denies chest pain, increased sob or doe, wheezing, orthopnea, PND, increased LE swelling, palpitations, dizziness or syncope.  Denies new focal neuro s/s.   Pt denies fever, wt loss, night sweats, loss of appetite, or other constitutional symptoms  No other new complaints   Wt Readings from Last 3 Encounters:  12/09/20 227 lb (103 kg)  08/18/20 222 lb (100.7 kg)  08/10/20 223 lb 1.7 oz (101.2 kg)   BP Readings from Last 3 Encounters:  12/09/20 126/78  08/18/20 130/80  08/11/20 (!) 141/83   Immunization History  Administered Date(s) Administered  . Influenza,inj,Quad PF,6+ Mos 06/24/2014, 06/30/2015, 06/15/2017, 11/27/2018  . PFIZER(Purple Top)SARS-COV-2 Vaccination 04/25/2020, 05/16/2020  . Tdap 07/29/2008, 11/27/2018  There are no preventive care reminders to display for this patient.    Past Medical History:  Diagnosis Date  . Depression   . Hyperlipidemia   . Hypertension   . Pancreatitis   . Tobacco abuse    Past Surgical History:  Procedure Laterality Date  . EYE SURGERY     RIGHT (CHILDHOOD)  . JOINT REPLACEMENT      reports that he has been smoking cigarettes. He has a 37.00 pack-year smoking history. He has never used smokeless tobacco. He reports current alcohol use of about 33.0 standard drinks of alcohol per week. He reports that he does not use drugs. family history includes Diabetes in his father; Heart attack in his maternal grandmother, mother, and paternal  grandfather; Heart disease in his mother; Hypertension in his father and mother; Kidney Stones in his sister; Kidney disease in his mother. Allergies  Allergen Reactions  . Sular Swelling   Current Outpatient Medications on File Prior to Visit  Medication Sig Dispense Refill  . ALPRAZolam (XANAX) 1 MG tablet TAKE 1 TABLET BY MOUTH THREE TIMES A DAY AS NEEDED FOR ANXIETY 90 tablet 3  . amLODipine (NORVASC) 10 MG tablet TAKE 1 TABLET BY MOUTH EVERY DAY 30 tablet 8  . atenolol (TENORMIN) 100 MG tablet TAKE 1 TABLET BY MOUTH DAILY. MUST KEEP SCHEDULE APPT W/NEW PROVIDER FOR FUTURE REFILLS (Patient taking differently: Take 100 mg by mouth daily. TAKE 1 TABLET BY MOUTH DAILY. MUST KEEP SCHEDULE APPT W/NEW PROVIDER FOR FUTURE REFILLS) 90 tablet 3  . atorvastatin (LIPITOR) 80 MG tablet TAKE 1 TABLET BY MOUTH EVERY DAY 30 tablet 8  . Cholecalciferol (VITAMIN D) 50 MCG (2000 UT) tablet Take 2,000 Units by mouth daily.    . Coenzyme Q10 100 MG capsule Take 100 mg by mouth daily.     . fenofibrate 160 MG tablet Take 1 tablet (160 mg total) by mouth daily. 30 tablet 11  . Magnesium 400 MG TABS Take 400 mg by mouth daily.     . NON FORMULARY Take 1  tablet by mouth daily. Full Spectrum Mineral Caps    . Omega-3 Fatty Acids (FISH OIL) 1000 MG CAPS Take 4 capsules by mouth daily.    Marland Kitchen omeprazole (PRILOSEC) 40 MG capsule TAKE 1 CAPSULE BY MOUTH EVERY DAY 30 capsule 8  . PRESCRIPTION MEDICATION Pt uses CPAP Machine    . VASCEPA 1 g capsule Take 2 g by mouth 2 (two) times daily.     No current facility-administered medications on file prior to visit.        ROS:  All others reviewed and negative.  Objective        PE:  BP 126/78   Pulse 66   Ht 5\' 9"  (1.753 m)   Wt 227 lb (103 kg)   SpO2 95%   BMI 33.52 kg/m                 Constitutional: Pt appears in NAD               HENT: Head: NCAT.                Right Ear: External ear normal.                 Left Ear: External ear normal.                 Eyes: . Pupils are equal, round, and reactive to light. Conjunctivae and EOM are normal               Nose: without d/c or deformity               Neck: Neck supple. Gross normal ROM               Cardiovascular: Normal rate and regular rhythm.                 Pulmonary/Chest: Effort normal and breath sounds without rales or wheezing.                Abd:  Soft, NT, ND, + BS, no organomegaly               Neurological: Pt is alert. At baseline orientation, motor grossly intact               Skin: Skin is warm. No rashes, no other new lesions, LE edema - none               Psychiatric: Pt behavior is normal without agitation   Micro: none  Cardiac tracings I have personally interpreted today:  none  Pertinent Radiological findings (summarize): none   Lab Results  Component Value Date   WBC 12.4 (H) 12/09/2020   HGB 15.0 12/09/2020   HCT 44.4 12/09/2020   PLT 241.0 12/09/2020   GLUCOSE 91 12/09/2020   CHOL 165 12/09/2020   TRIG 285.0 (H) 12/09/2020   HDL 16.40 (L) 12/09/2020   LDLDIRECT 100.0 12/09/2020   LDLCALC 90 04/28/2020   ALT 37 12/09/2020   AST 47 (H) 12/09/2020   NA 132 (L) 12/09/2020   K 4.4 12/09/2020   CL 97 12/09/2020   CREATININE 1.02 12/09/2020   BUN 12 12/09/2020   CO2 27 12/09/2020   TSH 3.08 12/09/2020   PSA 0.12 12/09/2020   INR 1.61 (H) 03/07/2011   HGBA1C 5.9 12/09/2020   Assessment/Plan:  Tanner Gomez is a 55 y.o. White or Caucasian [1] male with  has a past medical history of Depression, Hyperlipidemia, Hypertension,  Pancreatitis, and Tobacco abuse.  Encounter for well adult exam with abnormal findings Age and sex appropriate education and counseling updated with regular exercise and diet Referrals for preventative services - none needed Immunizations addressed - pt will consider having booster done Smoking counseling  - counseled to quit Evidence for depression or other mood disorder - none significant Most recent labs reviewed. I have  personally reviewed and have noted: 1) the patient's medical and social history 2) The patient's current medications and supplements 3) The patient's height, weight, and BMI have been recorded in the chart   HTN (hypertension) BP Readings from Last 3 Encounters:  12/09/20 126/78  08/18/20 130/80  08/11/20 (!) 141/83   Stable, pt to continue medical treatment amlodipine, atenolol   Current Outpatient Medications (Cardiovascular):  .  amLODipine (NORVASC) 10 MG tablet, TAKE 1 TABLET BY MOUTH EVERY DAY .  atenolol (TENORMIN) 100 MG tablet, TAKE 1 TABLET BY MOUTH DAILY. MUST KEEP SCHEDULE APPT W/NEW PROVIDER FOR FUTURE REFILLS (Patient taking differently: Take 100 mg by mouth daily. TAKE 1 TABLET BY MOUTH DAILY. MUST KEEP SCHEDULE APPT W/NEW PROVIDER FOR FUTURE REFILLS) .  atorvastatin (LIPITOR) 80 MG tablet, TAKE 1 TABLET BY MOUTH EVERY DAY .  fenofibrate 160 MG tablet, Take 1 tablet (160 mg total) by mouth daily. Marland Kitchen.  VASCEPA 1 g capsule, Take 2 g by mouth 2 (two) times daily.     Current Outpatient Medications (Other):  Marland Kitchen.  ALPRAZolam (XANAX) 1 MG tablet, TAKE 1 TABLET BY MOUTH THREE TIMES A DAY AS NEEDED FOR ANXIETY .  Cholecalciferol (VITAMIN D) 50 MCG (2000 UT) tablet, Take 2,000 Units by mouth daily. .  Coenzyme Q10 100 MG capsule, Take 100 mg by mouth daily.  .  Magnesium 400 MG TABS, Take 400 mg by mouth daily.  .  NON FORMULARY, Take 1 tablet by mouth daily. Full Spectrum Mineral Caps .  Omega-3 Fatty Acids (FISH OIL) 1000 MG CAPS, Take 4 capsules by mouth daily. Marland Kitchen.  omeprazole (PRILOSEC) 40 MG capsule, TAKE 1 CAPSULE BY MOUTH EVERY DAY .  PRESCRIPTION MEDICATION, Pt uses CPAP Machine   Hyperglycemia Lab Results  Component Value Date   HGBA1C 5.9 12/09/2020   Stable, pt to continue current medical treatment  - diet   Hypertriglyceridemia Lab Results  Component Value Date   LDLCALC 90 04/28/2020   Stable, pt to continue current statin  - lipitor, and  fenofibrate   Current Outpatient Medications (Cardiovascular):  .  amLODipine (NORVASC) 10 MG tablet, TAKE 1 TABLET BY MOUTH EVERY DAY .  atenolol (TENORMIN) 100 MG tablet, TAKE 1 TABLET BY MOUTH DAILY. MUST KEEP SCHEDULE APPT W/NEW PROVIDER FOR FUTURE REFILLS (Patient taking differently: Take 100 mg by mouth daily. TAKE 1 TABLET BY MOUTH DAILY. MUST KEEP SCHEDULE APPT W/NEW PROVIDER FOR FUTURE REFILLS) .  atorvastatin (LIPITOR) 80 MG tablet, TAKE 1 TABLET BY MOUTH EVERY DAY .  fenofibrate 160 MG tablet, Take 1 tablet (160 mg total) by mouth daily. Marland Kitchen.  VASCEPA 1 g capsule, Take 2 g by mouth 2 (two) times daily.     Current Outpatient Medications (Other):  Marland Kitchen.  ALPRAZolam (XANAX) 1 MG tablet, TAKE 1 TABLET BY MOUTH THREE TIMES A DAY AS NEEDED FOR ANXIETY .  Cholecalciferol (VITAMIN D) 50 MCG (2000 UT) tablet, Take 2,000 Units by mouth daily. .  Coenzyme Q10 100 MG capsule, Take 100 mg by mouth daily.  .  Magnesium 400 MG TABS, Take 400 mg by mouth daily.  .Marland Kitchen  NON FORMULARY, Take 1 tablet by mouth daily. Full Spectrum Mineral Caps .  Omega-3 Fatty Acids (FISH OIL) 1000 MG CAPS, Take 4 capsules by mouth daily. Marland Kitchen  omeprazole (PRILOSEC) 40 MG capsule, TAKE 1 CAPSULE BY MOUTH EVERY DAY .  PRESCRIPTION MEDICATION, Pt uses CPAP Machine  Elevated TSH ? Subclinical hypothyroid - asympt, for f/u tsh  Vitamin D deficiency Last vitamin D Lab Results  Component Value Date   VD25OH 39.02 12/09/2020   Stable, cont oral replacement  Tobacco abuse Counseled to quit  Followup: Return in about 1 year (around 12/09/2021).  Oliver Barre, MD 12/12/2020 8:38 PM [facility]

## 2020-12-09 NOTE — Patient Instructions (Addendum)
Please consider having your booster shot soon  Please continue all other medications as before, and refills have been done if requested.  Please have the pharmacy call with any other refills you may need.  Please continue your efforts at being more active, low cholesterol diet, and weight control.  You are otherwise up to date with prevention measures today.  Please keep your appointments with your specialists as you may have planned  Please go to the LAB at the blood drawing area for the tests to be done  You will be contacted by phone if any changes need to be made immediately.  Otherwise, you will receive a letter about your results with an explanation, but please check with MyChart first.  Please remember to sign up for MyChart if you have not done so, as this will be important to you in the future with finding out test results, communicating by private email, and scheduling acute appointments online when needed.  Please make an Appointment to return for your 1 year visit, or sooner if needed, with Lab testing by Appointment as well, to be done about 3-5 days before at the FIRST FLOOR Lab (so this is for TWO appointments - please see the scheduling desk as you leave)   Due to the ongoing Covid 19 pandemic, our lab now requires an appointment for any labs done at our office.  If you need labs done and do not have an appointment, please call our office ahead of time to schedule before presenting to the lab for your testing.

## 2020-12-12 ENCOUNTER — Encounter: Payer: Self-pay | Admitting: Internal Medicine

## 2020-12-12 DIAGNOSIS — E559 Vitamin D deficiency, unspecified: Secondary | ICD-10-CM | POA: Insufficient documentation

## 2020-12-12 DIAGNOSIS — R7989 Other specified abnormal findings of blood chemistry: Secondary | ICD-10-CM | POA: Insufficient documentation

## 2020-12-12 NOTE — Assessment & Plan Note (Signed)
BP Readings from Last 3 Encounters:  12/09/20 126/78  08/18/20 130/80  08/11/20 (!) 141/83   Stable, pt to continue medical treatment amlodipine, atenolol   Current Outpatient Medications (Cardiovascular):  .  amLODipine (NORVASC) 10 MG tablet, TAKE 1 TABLET BY MOUTH EVERY DAY .  atenolol (TENORMIN) 100 MG tablet, TAKE 1 TABLET BY MOUTH DAILY. MUST KEEP SCHEDULE APPT W/NEW PROVIDER FOR FUTURE REFILLS (Patient taking differently: Take 100 mg by mouth daily. TAKE 1 TABLET BY MOUTH DAILY. MUST KEEP SCHEDULE APPT W/NEW PROVIDER FOR FUTURE REFILLS) .  atorvastatin (LIPITOR) 80 MG tablet, TAKE 1 TABLET BY MOUTH EVERY DAY .  fenofibrate 160 MG tablet, Take 1 tablet (160 mg total) by mouth daily. Marland Kitchen  VASCEPA 1 g capsule, Take 2 g by mouth 2 (two) times daily.     Current Outpatient Medications (Other):  Marland Kitchen  ALPRAZolam (XANAX) 1 MG tablet, TAKE 1 TABLET BY MOUTH THREE TIMES A DAY AS NEEDED FOR ANXIETY .  Cholecalciferol (VITAMIN D) 50 MCG (2000 UT) tablet, Take 2,000 Units by mouth daily. .  Coenzyme Q10 100 MG capsule, Take 100 mg by mouth daily.  .  Magnesium 400 MG TABS, Take 400 mg by mouth daily.  .  NON FORMULARY, Take 1 tablet by mouth daily. Full Spectrum Mineral Caps .  Omega-3 Fatty Acids (FISH OIL) 1000 MG CAPS, Take 4 capsules by mouth daily. Marland Kitchen  omeprazole (PRILOSEC) 40 MG capsule, TAKE 1 CAPSULE BY MOUTH EVERY DAY .  PRESCRIPTION MEDICATION, Pt uses CPAP Machine

## 2020-12-12 NOTE — Assessment & Plan Note (Signed)
?   Subclinical hypothyroid - asympt, for f/u tsh

## 2020-12-12 NOTE — Assessment & Plan Note (Signed)
Last vitamin D Lab Results  Component Value Date   VD25OH 39.02 12/09/2020   Stable, cont oral replacement

## 2020-12-12 NOTE — Assessment & Plan Note (Addendum)
Age and sex appropriate education and counseling updated with regular exercise and diet Referrals for preventative services - none needed Immunizations addressed - pt will consider having booster done Smoking counseling  - counseled to quit Evidence for depression or other mood disorder - none significant Most recent labs reviewed. I have personally reviewed and have noted: 1) the patient's medical and social history 2) The patient's current medications and supplements 3) The patient's height, weight, and BMI have been recorded in the chart

## 2020-12-12 NOTE — Assessment & Plan Note (Signed)
Lab Results  Component Value Date   HGBA1C 5.9 12/09/2020   Stable, pt to continue current medical treatment  - diet  

## 2020-12-12 NOTE — Assessment & Plan Note (Signed)
Lab Results  Component Value Date   LDLCALC 90 04/28/2020   Stable, pt to continue current statin  - lipitor, and fenofibrate   Current Outpatient Medications (Cardiovascular):  .  amLODipine (NORVASC) 10 MG tablet, TAKE 1 TABLET BY MOUTH EVERY DAY .  atenolol (TENORMIN) 100 MG tablet, TAKE 1 TABLET BY MOUTH DAILY. MUST KEEP SCHEDULE APPT W/NEW PROVIDER FOR FUTURE REFILLS (Patient taking differently: Take 100 mg by mouth daily. TAKE 1 TABLET BY MOUTH DAILY. MUST KEEP SCHEDULE APPT W/NEW PROVIDER FOR FUTURE REFILLS) .  atorvastatin (LIPITOR) 80 MG tablet, TAKE 1 TABLET BY MOUTH EVERY DAY .  fenofibrate 160 MG tablet, Take 1 tablet (160 mg total) by mouth daily. Marland Kitchen  VASCEPA 1 g capsule, Take 2 g by mouth 2 (two) times daily.     Current Outpatient Medications (Other):  Marland Kitchen  ALPRAZolam (XANAX) 1 MG tablet, TAKE 1 TABLET BY MOUTH THREE TIMES A DAY AS NEEDED FOR ANXIETY .  Cholecalciferol (VITAMIN D) 50 MCG (2000 UT) tablet, Take 2,000 Units by mouth daily. .  Coenzyme Q10 100 MG capsule, Take 100 mg by mouth daily.  .  Magnesium 400 MG TABS, Take 400 mg by mouth daily.  .  NON FORMULARY, Take 1 tablet by mouth daily. Full Spectrum Mineral Caps .  Omega-3 Fatty Acids (FISH OIL) 1000 MG CAPS, Take 4 capsules by mouth daily. Marland Kitchen  omeprazole (PRILOSEC) 40 MG capsule, TAKE 1 CAPSULE BY MOUTH EVERY DAY .  PRESCRIPTION MEDICATION, Pt uses CPAP Machine

## 2020-12-12 NOTE — Assessment & Plan Note (Signed)
Counseled to quit 

## 2021-01-16 ENCOUNTER — Other Ambulatory Visit: Payer: Self-pay | Admitting: Internal Medicine

## 2021-01-16 DIAGNOSIS — I10 Essential (primary) hypertension: Secondary | ICD-10-CM

## 2021-01-16 NOTE — Telephone Encounter (Signed)
Please refill as per office routine med refill policy (all routine meds refilled for 3 mo or monthly per pt preference up to one year from last visit, then month to month grace period for 3 mo, then further med refills will have to be denied)  

## 2021-01-30 ENCOUNTER — Other Ambulatory Visit: Payer: Self-pay | Admitting: Internal Medicine

## 2021-02-01 ENCOUNTER — Telehealth: Payer: Self-pay | Admitting: Internal Medicine

## 2021-02-01 NOTE — Telephone Encounter (Signed)
Patient calling, states during his last visit that he asked his lab work to be sent to [facility], fax number below  Fax- [phone]

## 2021-02-01 NOTE — Telephone Encounter (Signed)
Fax completed.

## 2021-02-01 NOTE — Telephone Encounter (Signed)
[facility] calling, states they got pages 1-4 but not 4-12

## 2021-02-02 NOTE — Telephone Encounter (Signed)
[facility] calling, states they are missing the biometrics

## 2021-02-04 NOTE — Telephone Encounter (Signed)
re-faxed

## 2021-02-08 NOTE — Telephone Encounter (Signed)
Patient called and said that [facility] told him that they did not receive all the forms.he was wondering if they could be re faxed. Please advise    Fax: [phone]

## 2021-02-08 NOTE — Telephone Encounter (Signed)
REFAXED ALL DOCUMENTS AGAIN

## 2021-03-22 ENCOUNTER — Other Ambulatory Visit: Payer: Self-pay | Admitting: *Deleted

## 2021-03-23 ENCOUNTER — Other Ambulatory Visit: Payer: Self-pay

## 2021-03-23 MED ORDER — VASCEPA 1 G PO CAPS: 2.0000 g | ORAL_CAPSULE | Freq: Two times a day (BID) | ORAL | 3 refills | Status: DC

## 2021-05-13 ENCOUNTER — Telehealth: Payer: Self-pay | Admitting: Internal Medicine

## 2021-05-13 DIAGNOSIS — E782 Mixed hyperlipidemia: Secondary | ICD-10-CM

## 2021-05-13 NOTE — Telephone Encounter (Signed)
Lipid panel ordered for patient to completed before 05/27/21 visit with MD MyChart message sent

## 2021-05-25 ENCOUNTER — Other Ambulatory Visit (HOSPITAL_BASED_OUTPATIENT_CLINIC_OR_DEPARTMENT_OTHER)
Admission: RE | Admit: 2021-05-25 | Discharge: 2021-05-25 | Disposition: A | Discharge status: Home / Self Care | Payer: Commercial Managed Care - PPO | Source: Ambulatory Visit | Attending: Internal Medicine | Admitting: Internal Medicine

## 2021-05-25 ENCOUNTER — Other Ambulatory Visit: Payer: Self-pay

## 2021-05-25 ENCOUNTER — Telehealth: Payer: Self-pay

## 2021-05-25 DIAGNOSIS — E782 Mixed hyperlipidemia: Secondary | ICD-10-CM | POA: Insufficient documentation

## 2021-05-25 LAB — LIPID PANEL
Cholesterol: 127 mg/dL (ref 0–200)
HDL: 26 mg/dL — ABNORMAL LOW (ref >40)
LDL Cholesterol: 61 mg/dL (ref 0–99)
Total CHOL/HDL Ratio: 4.9 RATIO
Triglycerides: 199 mg/dL — ABNORMAL HIGH (ref <150)
VLDL: 40 mg/dL (ref 0–40)

## 2021-05-25 NOTE — Addendum Note (Signed)
Addended by: Geradine Girt B on: 05/25/2021 08:32 AM   Modules accepted: Orders

## 2021-05-25 NOTE — Telephone Encounter (Signed)
Pt presented to [facility] to obtain lab work. Per lab tech, orders will need to be place as future versus active in order to collect. New lab order placed.

## 2021-05-27 ENCOUNTER — Ambulatory Visit (INDEPENDENT_AMBULATORY_CARE_PROVIDER_SITE_OTHER): Payer: Commercial Managed Care - PPO | Admitting: Internal Medicine

## 2021-05-27 ENCOUNTER — Encounter (HOSPITAL_BASED_OUTPATIENT_CLINIC_OR_DEPARTMENT_OTHER): Payer: Self-pay | Admitting: Internal Medicine

## 2021-05-27 ENCOUNTER — Other Ambulatory Visit: Payer: Self-pay

## 2021-05-27 VITALS — BP 112/66 | HR 63 | Resp 16 | Ht 69.0 in | Wt 221.0 lb

## 2021-05-27 DIAGNOSIS — E782 Mixed hyperlipidemia: Secondary | ICD-10-CM | POA: Diagnosis not present

## 2021-05-27 DIAGNOSIS — F1020 Alcohol dependence, uncomplicated: Secondary | ICD-10-CM | POA: Diagnosis not present

## 2021-05-27 DIAGNOSIS — Z8719 Personal history of other diseases of the digestive system: Secondary | ICD-10-CM | POA: Diagnosis not present

## 2021-05-27 DIAGNOSIS — E559 Vitamin D deficiency, unspecified: Secondary | ICD-10-CM | POA: Diagnosis not present

## 2021-05-27 NOTE — Patient Instructions (Signed)
Medication Instructions:  Your physician recommends that you continue on your current medications as directed. Please refer to the Current Medication list given to you today.  *If you need a refill on your cardiac medications before your next appointment, please call your pharmacy*   Lab Work: FASTING lab work in about 1 year to check cholesterol & vitamin D  If you have labs (blood work) drawn today and your tests are completely normal, you will receive your results only by: MyChart Message (if you have MyChart) OR A paper copy in the mail If you have any lab test that is abnormal or we need to change your treatment, we will call you to review the results.   Testing/Procedures: NONE   Follow-Up: At [facility], you and your health needs are our priority.  As part of our continuing mission to provide you with exceptional heart care, we have created designated Provider Care Teams.  These Care Teams include your primary Cardiologist (physician) and Advanced Practice Providers (APPs -  Physician Assistants and Nurse Practitioners) who all work together to provide you with the care you need, when you need it.  We recommend signing up for the patient portal called "MyChart".  Sign up information is provided on this After Visit Summary.  MyChart is used to connect with patients for Virtual Visits (Telemedicine).  Patients are able to view lab/test results, encounter notes, upcoming appointments, etc.  Non-urgent messages can be sent to your provider as well.   To learn more about what you can do with MyChart, go to [email or web address].    Your next appointment:   12 month(s) - lipid clinic  The format for your next appointment:   In Person  Provider:   K. Italy Hilty, MD   Other Instructions

## 2021-05-27 NOTE — Progress Notes (Signed)
LIPID CLINIC CONSULT NOTE  Chief Complaint:  Follow-up hypertriglyceridemia  Primary Care Physician: Corwin Levins, MD  Primary Cardiologist:  None  HPI:  Tanner Gomez is a 55 y.o. male who is being seen today for the evaluation of hypertriglyceridemia at the request of Corwin Levins, MD.  Mr. Tanner Gomez is a pleasant 55 year old male who has been followed by Dr. Mayford Knife with a history of hypertension, dyslipidemia, alcohol and tobacco use, depression and pancreatitis in the setting of severe hypertriglyceridemia.  He is also been followed recently by Margaretmary Dys, PharmD in the [facility] lipid clinic.  He was most recently seen in September 2019, at the time it was recommended that he start Vascepa.  Repeat labs, however recently showed no significant change in his triglycerides and he was referred to me.  His total cholesterol was 163, triglycerides 398, HDL 14 and LDL of 69.  A direct LDL was 94.  He does report compliance with the following lipid medications: Atorvastatin 80 mg daily, fenofibrate 160 mg daily and Niaspan 2000 mg daily.  Unfortunately he reports persistent alcohol use.  He says he drinks 5-6 beers a day and this actually represents a significant reduction in his alcohol intake.  We talked about this at great length and a does not seem that he is interested in necessarily stopping the alcohol but does understand that it is adversely affecting his triglycerides and increasing cardiovascular risk.  He seems to feel that since he can perform at his job and it does not seem to interfere with his lifestyle that it is not abnormal, but I would consider his alcohol use excessive.  The barrier to getting Vascepa was cost and it was greater than $200/month.  This may need to be revisited.  09/01/2019  Mr. Tanner Gomez returns today for follow-up of hypertriglyceridemia.  He has had significant improvement in his numbers.  He says he is been able to make significant dietary changes.  He is cut  back on fast foods and eating more healthy meals.  He is more active at work walking around the [employer] on a daily basis.  He also is working to try to start an exercise program at [employer].  He is triglycerides most recently were down to 248 (improved from 422 in the past).  He has had no further episodes of pancreatitis.  He is also reduced alcohol intake significantly.  His most recent lipid profile showed total cholesterol 154, triglycerides 248, HDL 20 LDL 92.  He is also on high potency statin and fenofibrate 160 mg daily.  05/05/2020  Mr. Tanner Gomez is seen today for follow-up.  He is triglycerides have gone up over the past 6 months.  Currently they are 304 from 203.  LDL cholesterol is 89 with total 160 and HDL is come down from 32-19.  Some of this may be related to dietary changes which she is noted notably made in the last year including more biscuits and other fast food items.  In addition he was diagnosed with low vitamin D levels which could account for his low HDL.  He was given a 50,000 units supplementation and has not yet started on maintenance dosing which I recommended at least 2000 or 3000 units daily.  05/27/2021  Mr. Tanner Gomez is seen today in follow-up.  His numbers continue to improve.  Total cholesterol now 127, triglycerides 199, down from 285.  HDL 26 and LDL is now 40, down from 57.  He is  compliant with his medications.  He has made significant lifestyle changes.  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  He has reduced alcohol intake.  Overall he is doing very well.  Blood pressure is good today.  I have repleted his vitamin D which was within normal limits back in March.  He has follow-up with his primary care provider in February 2023.  PMHx:  Past Medical History:  Diagnosis Date   Depression    Hyperlipidemia    Hypertension    Pancreatitis    Tobacco abuse     Past Surgical History:  Procedure Laterality Date   EYE SURGERY     RIGHT  (CHILDHOOD)   JOINT REPLACEMENT      FAMHx:  Family History  Problem Relation Age of Onset   Kidney disease Tanner Gomez    Hypertension Tanner Gomez    Heart disease Tanner Gomez    Heart attack Tanner Gomez    Diabetes Tanner Gomez    Hypertension Tanner Gomez    Kidney Stones Sister    Heart attack Maternal Grandmother    Heart attack Paternal Grandfather    Colon cancer Neg Hx     SOCHx:   reports that he has been smoking cigarettes. He has a 37.00 pack-year smoking history. He has never used smokeless tobacco. He reports current alcohol use of about 33.0 standard drinks per week. He reports that he does not use drugs.  ALLERGIES:  Allergies  Allergen Reactions   Sular Swelling    ROS: Pertinent items noted in HPI and remainder of comprehensive ROS otherwise negative.  HOME MEDS: Current Outpatient Medications on File Prior to Visit  Medication Sig Dispense Refill   ALPRAZolam (XANAX) 1 MG tablet TAKE 1 TABLET BY MOUTH THREE TIMES A DAY AS NEEDED FOR ANXIETY 90 tablet 3   amLODipine (NORVASC) 10 MG tablet TAKE 1 TABLET BY MOUTH EVERY DAY 30 tablet 8   atenolol (TENORMIN) 100 MG tablet TAKE 1 TABLET BY MOUTH DAILY. MUST KEEP SCHEDULE APPT W/NEW PROVIDER FOR FUTURE REFILLS 30 tablet 11   atorvastatin (LIPITOR) 80 MG tablet TAKE 1 TABLET BY MOUTH EVERY DAY 30 tablet 8   Cholecalciferol (VITAMIN D) 50 MCG (2000 UT) tablet Take 2,000 Units by mouth daily.     Coenzyme Q10 100 MG capsule Take 100 mg by mouth daily.      fenofibrate 160 MG tablet TAKE 1 TABLET BY MOUTH EVERY DAY 30 tablet 11   Magnesium 400 MG TABS Take 400 mg by mouth daily.      NON FORMULARY Take 1 tablet by mouth daily. Full Spectrum Mineral Caps     Omega-3 Fatty Acids (FISH OIL) 1000 MG CAPS Take 4 capsules by mouth daily.     omeprazole (PRILOSEC) 40 MG capsule TAKE 1 CAPSULE BY MOUTH EVERY DAY 30 capsule 8   PRESCRIPTION MEDICATION Pt uses CPAP Machine     VASCEPA 1 g capsule Take 2 capsules (2 g total) by mouth 2 (two) times daily.  180 capsule 3   No current facility-administered medications on file prior to visit.    LABS/IMAGING: No results found for this or any previous visit (from the past 48 hour(s)). No results found.  LIPID PANEL:    Component Value Date/Time   CHOL 127 05/25/2021 0834   CHOL 160 04/28/2020 1019   TRIG 199 (H) 05/25/2021 0834   HDL 26 (L) 05/25/2021 0834   HDL 19 (L) 04/28/2020 1019   CHOLHDL 4.9 05/25/2021 0834  VLDL 40 05/25/2021 0834   LDLCALC 61 05/25/2021 0834   LDLCALC 90 04/28/2020 1019   LDLDIRECT 100.0 12/09/2020 0904    WEIGHTS: Wt Readings from Last 3 Encounters:  05/27/21 221 lb (100.2 kg)  12/09/20 227 lb (103 kg)  08/18/20 222 lb (100.7 kg)    VITALS: BP 112/66   Pulse 63   Resp 16   Ht 5\' 9"  (1.753 m)   Wt 221 lb (100.2 kg)   SpO2 97%   BMI 32.64 kg/m   EXAM: General appearance: alert and no distress Neck: no JVD, supple, symmetrical, trachea midline, and thyroid not enlarged, symmetric, no tenderness/mass/nodules Lungs: clear to auscultation bilaterally Heart: regular rate and rhythm, S1, S2 normal, no murmur, click, rub or gallop Abdomen: soft, non-tender; bowel sounds normal; no masses,  no organomegaly Extremities: extremities normal, atraumatic, no cyanosis or edema Pulses: 2+ and symmetric Skin: Skin color, texture, turgor normal. No rashes or lesions Neurologic: Grossly normal Psych: Pleasant  EKG: Deferred  ASSESSMENT: Primary hypertriglyceridemia History of pancreatitis secondary to #1 and alcohol use Alcohol dependence  Vitamin D deficiency  PLAN: 1.   Mr. Gharibian continues to have improvement in his lipids.  He is more physically active and has made dietary changes.  He is compliant with his medications.  Vitamin D levels have improved.  He has backed off on alcohol significantly.  No changes to his medications today.  Plan follow-up with me annually or sooner as necessary.  Myrene Buddy, MD, [facility], FACP  [facility]  HeartCare  Medical Director of the [facility] Lipid Disorders &  Cardiovascular Risk Reduction Clinic Diplomate of the American Board of Clinical Lipidology Attending Cardiologist  Direct Dial: [phone]  Fax: [phone]  Website:  [email or web address] Mardella Nuckles 05/27/2021, 9:07 AM

## 2021-06-10 ENCOUNTER — Other Ambulatory Visit: Payer: Self-pay | Admitting: Internal Medicine

## 2021-07-15 ENCOUNTER — Other Ambulatory Visit: Payer: Self-pay | Admitting: Internal Medicine

## 2021-08-06 ENCOUNTER — Other Ambulatory Visit: Payer: Self-pay | Admitting: Internal Medicine

## 2021-08-06 DIAGNOSIS — I10 Essential (primary) hypertension: Secondary | ICD-10-CM

## 2021-08-06 DIAGNOSIS — K219 Gastro-esophageal reflux disease without esophagitis: Secondary | ICD-10-CM

## 2021-08-06 NOTE — Telephone Encounter (Signed)
Please refill as per office routine med refill policy (all routine meds to be refilled for 3 mo or monthly (per pt preference) up to one year from last visit, then month to month grace period for 3 mo, then further med refills will have to be denied) ? ?

## 2021-10-28 ENCOUNTER — Other Ambulatory Visit: Payer: Self-pay | Admitting: Internal Medicine

## 2021-11-30 ENCOUNTER — Other Ambulatory Visit: Payer: Self-pay | Admitting: Internal Medicine

## 2021-12-13 ENCOUNTER — Other Ambulatory Visit: Payer: Self-pay

## 2021-12-13 ENCOUNTER — Ambulatory Visit (INDEPENDENT_AMBULATORY_CARE_PROVIDER_SITE_OTHER): Payer: Commercial Managed Care - PPO | Admitting: Internal Medicine

## 2021-12-13 ENCOUNTER — Encounter: Payer: Self-pay | Admitting: Internal Medicine

## 2021-12-13 VITALS — BP 118/76 | HR 61 | Temp 98.8°F | Ht 69.0 in | Wt 232.0 lb

## 2021-12-13 DIAGNOSIS — E559 Vitamin D deficiency, unspecified: Secondary | ICD-10-CM

## 2021-12-13 DIAGNOSIS — E871 Hypo-osmolality and hyponatremia: Secondary | ICD-10-CM

## 2021-12-13 DIAGNOSIS — I10 Essential (primary) hypertension: Secondary | ICD-10-CM

## 2021-12-13 DIAGNOSIS — D72829 Elevated white blood cell count, unspecified: Secondary | ICD-10-CM | POA: Insufficient documentation

## 2021-12-13 DIAGNOSIS — E538 Deficiency of other specified B group vitamins: Secondary | ICD-10-CM

## 2021-12-13 DIAGNOSIS — M5412 Radiculopathy, cervical region: Secondary | ICD-10-CM | POA: Diagnosis not present

## 2021-12-13 DIAGNOSIS — Z720 Tobacco use: Secondary | ICD-10-CM

## 2021-12-13 DIAGNOSIS — Z0001 Encounter for general adult medical examination with abnormal findings: Secondary | ICD-10-CM | POA: Diagnosis not present

## 2021-12-13 DIAGNOSIS — R739 Hyperglycemia, unspecified: Secondary | ICD-10-CM

## 2021-12-13 LAB — CBC WITH DIFFERENTIAL/PLATELET
Basophils Absolute: 0.1 10*3/uL (ref 0.0–0.1)
Basophils Relative: 0.5 % (ref 0.0–3.0)
Eosinophils Absolute: 0.3 10*3/uL (ref 0.0–0.7)
Eosinophils Relative: 2.7 % (ref 0.0–5.0)
HCT: 44 % (ref 39.0–52.0)
Hemoglobin: 14.8 g/dL (ref 13.0–17.0)
Lymphocytes Relative: 22.7 % (ref 12.0–46.0)
Lymphs Abs: 2.9 10*3/uL (ref 0.7–4.0)
MCHC: 33.7 g/dL (ref 30.0–36.0)
MCV: 90.8 fl (ref 78.0–100.0)
Monocytes Absolute: 1 10*3/uL (ref 0.1–1.0)
Monocytes Relative: 8.2 % (ref 3.0–12.0)
Neutro Abs: 8.4 10*3/uL — ABNORMAL HIGH (ref 1.4–7.7)
Neutrophils Relative %: 65.9 % (ref 43.0–77.0)
Platelets: 251 10*3/uL (ref 150.0–400.0)
RBC: 4.85 Mil/uL (ref 4.22–5.81)
RDW: 13.8 % (ref 11.5–15.5)
WBC: 12.7 10*3/uL — ABNORMAL HIGH (ref 4.0–10.5)

## 2021-12-13 LAB — URINALYSIS, ROUTINE W REFLEX MICROSCOPIC
Bilirubin Urine: NEGATIVE
Hgb urine dipstick: NEGATIVE
Ketones, ur: NEGATIVE
Leukocytes,Ua: NEGATIVE
Nitrite: NEGATIVE
RBC / HPF: NONE SEEN (ref 0–?)
Specific Gravity, Urine: <=1.005 — AB (ref 1.000–1.030)
Total Protein, Urine: NEGATIVE
Urine Glucose: NEGATIVE
Urobilinogen, UA: 0.2 (ref 0.0–1.0)
WBC, UA: NONE SEEN (ref 0–?)
pH: 7 (ref 5.0–8.0)

## 2021-12-13 LAB — BASIC METABOLIC PANEL
BUN: 10 mg/dL (ref 6–23)
CO2: 28 mEq/L (ref 19–32)
Calcium: 9.9 mg/dL (ref 8.4–10.5)
Chloride: 98 mEq/L (ref 96–112)
Creatinine, Ser: 1.01 mg/dL (ref 0.40–1.50)
GFR: 83.24 mL/min (ref >60.00)
Glucose, Bld: 82 mg/dL (ref 70–99)
Potassium: 4.5 mEq/L (ref 3.5–5.1)
Sodium: 135 mEq/L (ref 135–145)

## 2021-12-13 LAB — TSH: TSH: 4.88 u[IU]/mL (ref 0.35–5.50)

## 2021-12-13 LAB — LIPID PANEL
Cholesterol: 151 mg/dL (ref 0–200)
HDL: 19.3 mg/dL — ABNORMAL LOW (ref >39.00)
Total CHOL/HDL Ratio: 8
Triglycerides: 405 mg/dL — ABNORMAL HIGH (ref 0.0–149.0)

## 2021-12-13 LAB — HEPATIC FUNCTION PANEL
ALT: 29 U/L (ref 0–53)
AST: 29 U/L (ref 0–37)
Albumin: 4.6 g/dL (ref 3.5–5.2)
Alkaline Phosphatase: 47 U/L (ref 39–117)
Bilirubin, Direct: 0.2 mg/dL (ref 0.0–0.3)
Total Bilirubin: 0.7 mg/dL (ref 0.2–1.2)
Total Protein: 7.7 g/dL (ref 6.0–8.3)

## 2021-12-13 LAB — PSA: PSA: 0.06 ng/mL — ABNORMAL LOW (ref 0.10–4.00)

## 2021-12-13 LAB — VITAMIN D 25 HYDROXY (VIT D DEFICIENCY, FRACTURES): VITD: 65.04 ng/mL (ref 30.00–100.00)

## 2021-12-13 LAB — LDL CHOLESTEROL, DIRECT: Direct LDL: 87 mg/dL

## 2021-12-13 LAB — VITAMIN B12: Vitamin B-12: 218 pg/mL (ref 211–911)

## 2021-12-13 LAB — HEMOGLOBIN A1C: Hgb A1c MFr Bld: 5.9 % (ref 4.6–6.5)

## 2021-12-13 NOTE — Assessment & Plan Note (Signed)
Lab Results  Component Value Date   VITAMINB12 222 12/09/2020   Low, to stat oral replacement - b12 1000 mcg qd

## 2021-12-13 NOTE — Progress Notes (Signed)
Patient ID: Tanner Gomez, male   DOB: October 12, 1966, 56 y.o.   MRN: [id number]         Chief Complaint:: wellness exam and hyperglycemia, low b12 and vit D, smoker, hyponatremia leukocytois, right cervical radiculitis       HPI:  Tanner Gomez is a 56 y.o. male here for wellness exam; declines shingrix, o/w up to date.  Still smoking, not ready to quit                        Also Pt denies chest pain, increased sob or doe, wheezing, orthopnea, PND, increased LE swelling, palpitations, dizziness or syncope.   Pt denies polydipsia, polyuria, or new focal neuro s/s.    Pt denies fever, wt loss, night sweats, loss of appetite, or other constitutional symptoms  In fact,  Gained 10 lbs with less rigorous diet.  Pt taking some OTC B12, not sure how much.  Not taking Vit D.  Does also have 1 mo recurring right post lat burning pain, mild intermittent with radiation to the shoulder, but no other radiation to the arm and denies tingling or extremity weakness.  Nothing makes better or worse.  Wt Readings from Last 3 Encounters:  12/13/21 232 lb (105.2 kg)  05/27/21 221 lb (100.2 kg)  12/09/20 227 lb (103 kg)   BP Readings from Last 3 Encounters:  12/13/21 118/76  05/27/21 112/66  12/09/20 126/78   Immunization History  Administered Date(s) Administered   Influenza,inj,Quad PF,6+ Mos 06/24/2014, 06/30/2015, 06/15/2017, 11/27/2018   PFIZER(Purple Top)SARS-COV-2 Vaccination 04/25/2020, 05/16/2020   Tdap 07/29/2008, 11/27/2018   There are no preventive care reminders to display for this patient.     Past Medical History:  Diagnosis Date   Depression    Hyperlipidemia    Hypertension    Pancreatitis    Tobacco abuse    Past Surgical History:  Procedure Laterality Date   EYE SURGERY     RIGHT (CHILDHOOD)   JOINT REPLACEMENT      reports that he has been smoking cigarettes. He has a 37.00 pack-year smoking history. He has never used smokeless tobacco. He reports current alcohol use of about 33.0  standard drinks per week. He reports that he does not use drugs. family history includes Diabetes in his father; Heart attack in his maternal grandmother, mother, and paternal grandfather; Heart disease in his mother; Hypertension in his father and mother; Kidney Stones in his sister; Kidney disease in his mother. Allergies  Allergen Reactions   Sular Swelling   Current Outpatient Medications on File Prior to Visit  Medication Sig Dispense Refill   ALPRAZolam (XANAX) 1 MG tablet *NEED OFFICE VISIT* TAKE 1 TABLET BY MOUTH THREE TIMES A DAY AS NEEDED FOR ANXIETY 90 tablet 0   amLODipine (NORVASC) 10 MG tablet TAKE 1 TABLET BY MOUTH EVERY DAY 90 tablet 1   atenolol (TENORMIN) 100 MG tablet TAKE 1 TABLET BY MOUTH DAILY. MUST KEEP SCHEDULE APPT W/NEW PROVIDER FOR FUTURE REFILLS 30 tablet 11   atorvastatin (LIPITOR) 80 MG tablet TAKE 1 TABLET BY MOUTH EVERY DAY 90 tablet 1   Cholecalciferol (VITAMIN D) 50 MCG (2000 UT) tablet Take 2,000 Units by mouth daily.     Coenzyme Q10 100 MG capsule Take 100 mg by mouth daily.      fenofibrate 160 MG tablet TAKE 1 TABLET BY MOUTH EVERY DAY 30 tablet 11   icosapent Ethyl (VASCEPA) 1 g capsule TAKE 2 CAPSULES BY  MOUTH 2 TIMES DAILY. 120 capsule 11   Magnesium 400 MG TABS Take 400 mg by mouth daily.      NON FORMULARY Take 1 tablet by mouth daily. Full Spectrum Mineral Caps     Omega-3 Fatty Acids (FISH OIL) 1000 MG CAPS Take 4 capsules by mouth daily.     omeprazole (PRILOSEC) 40 MG capsule TAKE 1 CAPSULE BY MOUTH EVERY DAY 90 capsule 1   PRESCRIPTION MEDICATION Pt uses CPAP Machine     No current facility-administered medications on file prior to visit.        ROS:  All others reviewed and negative.  Objective        PE:  BP 118/76 (BP Location: Right Arm, Patient Position: Sitting, Cuff Size: Large)    Pulse 61    Temp 98.8 F (37.1 C) (Oral)    Ht 5\' 9"  (1.753 m)    Wt 232 lb (105.2 kg)    SpO2 (!) 61%    BMI 34.26 kg/m                  Constitutional: Pt appears in NAD               HENT: Head: NCAT.                Right Ear: External ear normal.                 Left Ear: External ear normal.                Eyes: . Pupils are equal, round, and reactive to light. Conjunctivae and EOM are normal               Nose: without d/c or deformity               Neck: Neck supple. Gross normal ROM               Cardiovascular: Normal rate and regular rhythm.                 Pulmonary/Chest: Effort normal and breath sounds without rales or wheezing.                Abd:  Soft, NT, ND, + BS, no organomegaly               Neurological: Pt is alert. At baseline orientation, motor grossly intact               Skin: Skin is warm. No rashes, no other new lesions, LE edema - none               Psychiatric: Pt behavior is normal without agitation   Micro: none  Cardiac tracings I have personally interpreted today:  none  Pertinent Radiological findings (summarize): none   Lab Results  Component Value Date   WBC 12.7 (H) 12/13/2021   HGB 14.8 12/13/2021   HCT 44.0 12/13/2021   PLT 251.0 12/13/2021   GLUCOSE 82 12/13/2021   CHOL 151 12/13/2021   TRIG (H) 12/13/2021    405.0 Triglyceride is over 400; calculations on Lipids are invalid.   HDL 19.30 (L) 12/13/2021   LDLDIRECT 87.0 12/13/2021   LDLCALC 61 05/25/2021   ALT 29 12/13/2021   AST 29 12/13/2021   NA 135 12/13/2021   K 4.5 12/13/2021   CL 98 12/13/2021   CREATININE 1.01 12/13/2021   BUN 10 12/13/2021   CO2 28 12/13/2021  TSH 4.88 12/13/2021   PSA 0.06 (L) 12/13/2021   INR 1.61 (H) 03/07/2011   HGBA1C 5.9 12/13/2021   Assessment/Plan:  Tanner Gomez is a 57 y.o. White or Caucasian [1] male with  has a past medical history of Depression, Hyperlipidemia, Hypertension, Pancreatitis, and Tobacco abuse.  Vitamin D deficiency Last vitamin D Lab Results  Component Value Date   VD25OH 39.02 12/09/2020   Low, to start oral replacement   B12 deficiency Lab Results   Component Value Date   VITAMINB12 222 12/09/2020   Low, to stat oral replacement - b12 1000 mcg qd   Tobacco abuse Pt counsled to quit, pt not ready  Encounter for well adult exam with abnormal findings Age and sex appropriate education and counseling updated with regular exercise and diet Referrals for preventative services - none needed Immunizations addressed - declines shingrix Smoking counseling  - counsled to quit, pt not ready Evidence for depression or other mood disorder - none significant Most recent labs reviewed. I have personally reviewed and have noted: 1) the patient's medical and social history 2) The patient's current medications and supplements 3) The patient's height, weight, and BMI have been recorded in the chart   HTN (hypertension) BP Readings from Last 3 Encounters:  12/13/21 118/76  05/27/21 112/66  12/09/20 126/78   Stable, pt to continue medical treatment amlodipine, tenormin   Hyperglycemia Lab Results  Component Value Date   HGBA1C 5.9 12/13/2021   Stable, pt to continue current medical treatment  - diet   Hyponatremia Chronic mild low stable, cont to follow  Leukocytosis ? Chronic mild slight elevated, continue to follow  Cervical radiculitis 1 month right mild intermittent recent new onset, no neuro changes on exam, cont to follow, tylenol prn Followup: Return in about 1 year (around 12/13/2022).  Oliver Barre, MD 12/15/2021 4:45 AM [facility]

## 2021-12-13 NOTE — Assessment & Plan Note (Signed)
Last vitamin D Lab Results  Component Value Date   VD25OH 39.02 12/09/2020   Low, to start oral replacement

## 2021-12-13 NOTE — Patient Instructions (Signed)
Please take OTC Vitamin D3 at 2000 units per day, indefinitely  Please continue all other medications as before, and refills have been done if requested.  Please have the pharmacy call with any other refills you may need.  Please continue your efforts at being more active, low cholesterol diet, and weight control.  You are otherwise up to date with prevention measures today.  Please keep your appointments with your specialists as you may have planned  Please go to the LAB at the blood drawing area for the tests to be done  You will be contacted by phone if any changes need to be made immediately.  Otherwise, you will receive a letter about your results with an explanation, but please check with MyChart first.  Please remember to sign up for MyChart if you have not done so, as this will be important to you in the future with finding out test results, communicating by private email, and scheduling acute appointments online when needed.  Please make an Appointment to return for your 1 year visit, or sooner if needed 

## 2021-12-15 ENCOUNTER — Encounter: Payer: Self-pay | Admitting: Internal Medicine

## 2021-12-15 DIAGNOSIS — M5412 Radiculopathy, cervical region: Secondary | ICD-10-CM | POA: Insufficient documentation

## 2021-12-15 NOTE — Assessment & Plan Note (Signed)
Age and sex appropriate education and counseling updated with regular exercise and diet ?Referrals for preventative services - none needed ?Immunizations addressed - declines shingrix ?Smoking counseling  - counsled to quit, pt not ready ?Evidence for depression or other mood disorder - none significant ?Most recent labs reviewed. ?I have personally reviewed and have noted: ?1) the patient's medical and social history ?2) The patient's current medications and supplements ?3) The patient's height, weight, and BMI have been recorded in the chart ? ?

## 2021-12-15 NOTE — Assessment & Plan Note (Signed)
?   Chronic mild slight elevated, continue to follow ?

## 2021-12-15 NOTE — Assessment & Plan Note (Signed)
1 month right mild intermittent recent new onset, no neuro changes on exam, cont to follow, tylenol prn ?

## 2021-12-15 NOTE — Assessment & Plan Note (Signed)
Lab Results  ?Component Value Date  ? HGBA1C 5.9 12/13/2021  ? ?Stable, pt to continue current medical treatment  - diet ? ?

## 2021-12-15 NOTE — Assessment & Plan Note (Signed)
Pt counsled to quit, pt not ready °

## 2021-12-15 NOTE — Assessment & Plan Note (Signed)
Chronic mild low stable, cont to follow ?

## 2021-12-15 NOTE — Assessment & Plan Note (Signed)
BP Readings from Last 3 Encounters:  ?12/13/21 118/76  ?05/27/21 112/66  ?12/09/20 126/78  ? ?Stable, pt to continue medical treatment amlodipine, tenormin ? ?

## 2021-12-31 ENCOUNTER — Other Ambulatory Visit: Payer: Self-pay | Admitting: Internal Medicine

## 2022-01-12 ENCOUNTER — Other Ambulatory Visit: Payer: Self-pay | Admitting: Internal Medicine

## 2022-01-12 DIAGNOSIS — I10 Essential (primary) hypertension: Secondary | ICD-10-CM

## 2022-01-12 NOTE — Telephone Encounter (Signed)
Please refill as per office routine med refill policy (all routine meds to be refilled for 3 mo or monthly (per pt preference) up to one year from last visit, then month to month grace period for 3 mo, then further med refills will have to be denied) ? ?

## 2022-01-26 ENCOUNTER — Encounter: Payer: Self-pay | Admitting: *Deleted

## 2022-01-26 DIAGNOSIS — Z006 Encounter for examination for normal comparison and control in clinical research program: Secondary | ICD-10-CM

## 2022-01-26 NOTE — Research (Signed)
Spoke to Tanner Gomez about Haematologist. He states he is out of the [place] and will be for a few months.  ?

## 2022-02-07 ENCOUNTER — Other Ambulatory Visit: Payer: Self-pay | Admitting: Internal Medicine

## 2022-02-07 DIAGNOSIS — I10 Essential (primary) hypertension: Secondary | ICD-10-CM

## 2022-02-07 NOTE — Telephone Encounter (Signed)
Please refill as per office routine med refill policy (all routine meds to be refilled for 3 mo or monthly (per pt preference) up to one year from last visit, then month to month grace period for 3 mo, then further med refills will have to be denied) ? ?

## 2022-02-16 ENCOUNTER — Other Ambulatory Visit: Payer: Self-pay | Admitting: Internal Medicine

## 2022-02-16 DIAGNOSIS — K219 Gastro-esophageal reflux disease without esophagitis: Secondary | ICD-10-CM

## 2022-02-16 NOTE — Telephone Encounter (Signed)
Please refill as per office routine med refill policy (all routine meds to be refilled for 3 mo or monthly (per pt preference) up to one year from last visit, then month to month grace period for 3 mo, then further med refills will have to be denied) ? ?

## 2022-03-31 ENCOUNTER — Other Ambulatory Visit: Payer: Self-pay | Admitting: *Deleted

## 2022-03-31 DIAGNOSIS — E782 Mixed hyperlipidemia: Secondary | ICD-10-CM

## 2022-03-31 DIAGNOSIS — E559 Vitamin D deficiency, unspecified: Secondary | ICD-10-CM

## 2022-04-29 ENCOUNTER — Other Ambulatory Visit: Payer: Self-pay | Admitting: Internal Medicine

## 2022-07-13 ENCOUNTER — Ambulatory Visit: Payer: Commercial Managed Care - PPO | Admitting: Internal Medicine

## 2022-07-13 VITALS — BP 134/78 | HR 65 | Temp 98.3°F | Ht 69.0 in | Wt 239.0 lb

## 2022-07-13 DIAGNOSIS — E538 Deficiency of other specified B group vitamins: Secondary | ICD-10-CM

## 2022-07-13 DIAGNOSIS — K859 Acute pancreatitis without necrosis or infection, unspecified: Secondary | ICD-10-CM | POA: Diagnosis not present

## 2022-07-13 DIAGNOSIS — R7989 Other specified abnormal findings of blood chemistry: Secondary | ICD-10-CM | POA: Diagnosis not present

## 2022-07-13 DIAGNOSIS — M549 Dorsalgia, unspecified: Secondary | ICD-10-CM | POA: Diagnosis not present

## 2022-07-13 DIAGNOSIS — E559 Vitamin D deficiency, unspecified: Secondary | ICD-10-CM | POA: Diagnosis not present

## 2022-07-13 DIAGNOSIS — R739 Hyperglycemia, unspecified: Secondary | ICD-10-CM | POA: Diagnosis not present

## 2022-07-13 DIAGNOSIS — I10 Essential (primary) hypertension: Secondary | ICD-10-CM

## 2022-07-13 LAB — HEMOGLOBIN A1C: Hgb A1c MFr Bld: 6 % (ref 4.6–6.5)

## 2022-07-13 LAB — CBC WITH DIFFERENTIAL/PLATELET
Basophils Absolute: 0.1 K/uL (ref 0.0–0.1)
Basophils Relative: 0.8 % (ref 0.0–3.0)
Eosinophils Absolute: 0.2 K/uL (ref 0.0–0.7)
Eosinophils Relative: 1.5 % (ref 0.0–5.0)
HCT: 42.1 % (ref 39.0–52.0)
Hemoglobin: 14.2 g/dL (ref 13.0–17.0)
Lymphocytes Relative: 16.2 % (ref 12.0–46.0)
Lymphs Abs: 2.6 K/uL (ref 0.7–4.0)
MCHC: 33.8 g/dL (ref 30.0–36.0)
MCV: 90.9 fl (ref 78.0–100.0)
Monocytes Absolute: 1.3 K/uL — ABNORMAL HIGH (ref 0.1–1.0)
Monocytes Relative: 7.9 % (ref 3.0–12.0)
Neutro Abs: 11.7 K/uL — ABNORMAL HIGH (ref 1.4–7.7)
Neutrophils Relative %: 73.6 % (ref 43.0–77.0)
Platelets: 255 K/uL (ref 150.0–400.0)
RBC: 4.63 Mil/uL (ref 4.22–5.81)
RDW: 13.6 % (ref 11.5–15.5)
WBC: 15.9 K/uL — ABNORMAL HIGH (ref 4.0–10.5)

## 2022-07-13 LAB — HEPATIC FUNCTION PANEL
ALT: 30 U/L (ref 0–53)
AST: 31 U/L (ref 0–37)
Albumin: 4.3 g/dL (ref 3.5–5.2)
Alkaline Phosphatase: 54 U/L (ref 39–117)
Bilirubin, Direct: 0.1 mg/dL (ref 0.0–0.3)
Total Bilirubin: 0.3 mg/dL (ref 0.2–1.2)
Total Protein: 7.7 g/dL (ref 6.0–8.3)

## 2022-07-13 LAB — TSH: TSH: 4.34 u[IU]/mL (ref 0.35–5.50)

## 2022-07-13 LAB — VITAMIN D 25 HYDROXY (VIT D DEFICIENCY, FRACTURES): VITD: 75.57 ng/mL (ref 30.00–100.00)

## 2022-07-13 LAB — LIPASE: Lipase: 32 U/L (ref 11.0–59.0)

## 2022-07-13 LAB — T4, FREE: Free T4: 0.69 ng/dL (ref 0.60–1.60)

## 2022-07-13 LAB — VITAMIN B12: Vitamin B-12: 1162 pg/mL — ABNORMAL HIGH (ref 211–911)

## 2022-07-13 MED ORDER — CYCLOBENZAPRINE HCL 5 MG PO TABS: 5.0000 mg | ORAL_TABLET | Freq: Three times a day (TID) | ORAL | 1 refills | Status: DC | PRN

## 2022-07-13 MED ORDER — TRAMADOL HCL 50 MG PO TABS: 50.0000 mg | ORAL_TABLET | Freq: Four times a day (QID) | ORAL | 0 refills | Status: DC | PRN

## 2022-07-13 MED ORDER — PREDNISONE 10 MG PO TABS: ORAL_TABLET | ORAL | 0 refills | Status: DC

## 2022-07-13 NOTE — Patient Instructions (Signed)
Please have your Shingrix (shingles) shots done at your local pharmacy  Please take all new medication as prescribed - the pain medication, muscle relaxer and prednisone  Please continue all other medications as before, and refills have been done if requested.  Please have the pharmacy call with any other refills you may need.  Please keep your appointments with your specialists as you may have planned  Please go to the LAB at the blood drawing area for the tests to be done  \You will be contacted by phone if any changes need to be made immediately.  Otherwise, you will receive a letter about your results with an explanation, but please check with MyChart first.  Please make an Appointment to return in 6 months, or sooner if needed

## 2022-07-13 NOTE — Progress Notes (Signed)
Patient ID: Tanner Gomez, male   DOB: Sep 17, 1966, 56 y.o.   MRN: [id number]        Chief Complaint: follow up back pain, hyperglycemia, hld, htn, low vit D       HPI:  Tanner Gomez is a 56 y.o. male here with c/o 1 wk onset mid low back pain, points to high lumbar area, concerned now that it might be recurring pancreatitis;  has gained 15 lbs with being less active lately, Pt denies chest pain, increased sob or doe, wheezing, orthopnea, PND, increased LE swelling, palpitations, dizziness or syncope.   Pt denies polydipsia, polyuria, or new focal neuro s/s.   Pt denies fever, wt loss, night sweats, loss of appetite, or other constitutional symptoms  Gained 15 lbs in past yr with less active.  No recent falls or injury. Still smoking, not ready to quit   due for shingrix Wt Readings from Last 3 Encounters:  07/13/22 239 lb (108.4 kg)  12/13/21 232 lb (105.2 kg)  05/27/21 221 lb (100.2 kg)   BP Readings from Last 3 Encounters:  07/13/22 134/78  12/13/21 118/76  05/27/21 112/66         Past Medical History:  Diagnosis Date   Depression    Hyperlipidemia    Hypertension    Pancreatitis    Tobacco abuse    Past Surgical History:  Procedure Laterality Date   EYE SURGERY     RIGHT (CHILDHOOD)   JOINT REPLACEMENT      reports that he has been smoking cigarettes. He has a 37.00 pack-year smoking history. He has never used smokeless tobacco. He reports current alcohol use of about 33.0 standard drinks of alcohol per week. He reports that he does not use drugs. family history includes Diabetes in his father; Heart attack in his maternal grandmother, mother, and paternal grandfather; Heart disease in his mother; Hypertension in his father and mother; Kidney Stones in his sister; Kidney disease in his mother. Allergies  Allergen Reactions   Sular Swelling   Current Outpatient Medications on File Prior to Visit  Medication Sig Dispense Refill   ALPRAZolam (XANAX) 1 MG tablet TAKE 1 TABLET BY  MOUTH THREE TIMES A DAY AS NEEDED FOR ANXIETY 90 tablet 2   amLODipine (NORVASC) 10 MG tablet TAKE 1 TABLET BY MOUTH EVERY DAY 90 tablet 2   ascorbic acid (VITAMIN C) 500 MG tablet Take 500 mg by mouth daily.     atenolol (TENORMIN) 100 MG tablet TAKE 1 TABLET BY MOUTH DAILY. MUST KEEP SCHEDULE APPT W/NEW PROVIDER FOR FUTURE REFILLS 90 tablet 3   atorvastatin (LIPITOR) 80 MG tablet TAKE 1 TABLET BY MOUTH EVERY DAY 90 tablet 2   Cholecalciferol (VITAMIN D) 50 MCG (2000 UT) tablet Take 2,000 Units by mouth daily.     Coenzyme Q10 100 MG capsule Take 100 mg by mouth daily.      cyanocobalamin (VITAMIN B12) 500 MCG tablet Take 500 mcg by mouth daily.     fenofibrate 160 MG tablet TAKE 1 TABLET BY MOUTH EVERY DAY 90 tablet 3   icosapent Ethyl (VASCEPA) 1 g capsule TAKE 2 CAPSULES BY MOUTH 2 TIMES DAILY. 120 capsule 11   Magnesium 400 MG TABS Take 400 mg by mouth daily.      NON FORMULARY Take 1 tablet by mouth daily. Full Spectrum Mineral Caps     Omega-3 Fatty Acids (FISH OIL) 1000 MG CAPS Take 4 capsules by mouth daily.     omeprazole (  PRILOSEC) 40 MG capsule TAKE 1 CAPSULE BY MOUTH EVERY DAY 90 capsule 2   PRESCRIPTION MEDICATION Pt uses CPAP Machine     No current facility-administered medications on file prior to visit.        ROS:  All others reviewed and negative.  Objective        PE:  BP 134/78 (BP Location: Left Arm, Patient Position: Sitting, Cuff Size: Large)   Pulse 65   Temp 98.3 F (36.8 C) (Oral)   Ht 5\' 9"  (1.753 m)   Wt 239 lb (108.4 kg)   SpO2 94%   BMI 35.29 kg/m                 Constitutional: Pt appears in NAD               HENT: Head: NCAT.                Right Ear: External ear normal.                 Left Ear: External ear normal.                Eyes: . Pupils are equal, round, and reactive to light. Conjunctivae and EOM are normal               Nose: without d/c or deformity               Neck: Neck supple. Gross normal ROM               Cardiovascular:  Normal rate and regular rhythm.                 Pulmonary/Chest: Effort normal and breath sounds without rales or wheezing.                Abd:  Soft, NT, ND, + BS, no organomegaly; spine has mild tenderness to high lumbar midline, without swelling, or rash               Neurological: Pt is alert. At baseline orientation, motor grossly intact               Skin: Skin is warm. No rashes, no other new lesions, LE edema - none               Psychiatric: Pt behavior is normal without agitation   Micro: none  Cardiac tracings I have personally interpreted today:  none  Pertinent Radiological findings (summarize): none   Lab Results  Component Value Date   WBC 15.9 (H) 07/13/2022   HGB 14.2 07/13/2022   HCT 42.1 07/13/2022   PLT 255.0 07/13/2022   GLUCOSE 82 12/13/2021   CHOL 151 12/13/2021   TRIG (H) 12/13/2021    405.0 Triglyceride is over 400; calculations on Lipids are invalid.   HDL 19.30 (L) 12/13/2021   LDLDIRECT 87.0 12/13/2021   LDLCALC 61 05/25/2021   ALT 30 07/13/2022   AST 31 07/13/2022   NA 135 12/13/2021   K 4.5 12/13/2021   CL 98 12/13/2021   CREATININE 1.01 12/13/2021   BUN 10 12/13/2021   CO2 28 12/13/2021   TSH 4.34 07/13/2022   PSA 0.06 (L) 12/13/2021   INR 1.61 (H) 03/07/2011   HGBA1C 6.0 07/13/2022   Assessment/Plan:  Tanner Gomez is a 56 y.o. White or Caucasian [1] male with  has a past medical history of Depression, Hyperlipidemia, Hypertension, Pancreatitis, and Tobacco abuse.  Vitamin D  deficiency Last vitamin D Lab Results  Component Value Date   VD25OH 75.57 07/13/2022   Stable, cont oral replacement   Hyperglycemia Lab Results  Component Value Date   HGBA1C 6.0 07/13/2022   Stable, pt to continue current medical treatment  - diet, wt control, excercise   HTN (hypertension) BP Readings from Last 3 Encounters:  07/13/22 134/78  12/13/21 118/76  05/27/21 112/66   Stable, pt to continue medical treatment norvasc 10 mg qd, tenormin 100  gm qd   Elevated TSH Also for f/u lab  Lab Results  Component Value Date   TSH 4.34 07/13/2022    B12 deficiency Lab Results  Component Value Date   VITAMINB12 1,162 (H) 07/13/2022   Stable, cont oral replacement - b12 1000 mcg qd   Acute pancreatitis For lipase but doubt current pain is related to this  Back pain 1 wk new onset, suspect undelrying lumbar djd, ddd, for tramaodl prn, muscle relaxer prn, prednisone taper trial, declines imaging for now, but to return if not improved  Followup: Return in about 6 months (around 01/11/2023).  Oliver Barre, MD 07/15/2022 3:10 PM [facility]

## 2022-07-15 ENCOUNTER — Encounter: Payer: Self-pay | Admitting: Internal Medicine

## 2022-07-15 ENCOUNTER — Other Ambulatory Visit: Payer: Self-pay | Admitting: Internal Medicine

## 2022-07-15 NOTE — Assessment & Plan Note (Signed)
Last vitamin D Lab Results  Component Value Date   VD25OH 75.57 07/13/2022   Stable, cont oral replacement

## 2022-07-15 NOTE — Assessment & Plan Note (Addendum)
1 wk new onset, suspect undelrying lumbar djd, ddd, for tramaodl prn, muscle relaxer prn, prednisone taper trial, declines imaging for now, but to return if not improved

## 2022-07-15 NOTE — Assessment & Plan Note (Signed)
BP Readings from Last 3 Encounters:  07/13/22 134/78  12/13/21 118/76  05/27/21 112/66   Stable, pt to continue medical treatment norvasc 10 mg qd, tenormin 100 gm qd

## 2022-07-15 NOTE — Assessment & Plan Note (Signed)
For lipase but doubt current pain is related to this

## 2022-07-15 NOTE — Assessment & Plan Note (Signed)
Lab Results  Component Value Date   VITAMINB12 1,162 (H) 07/13/2022   Stable, cont oral replacement - b12 1000 mcg qd

## 2022-07-15 NOTE — Assessment & Plan Note (Signed)
Lab Results  Component Value Date   HGBA1C 6.0 07/13/2022   Stable, pt to continue current medical treatment  - diet, wt control, excercise

## 2022-07-15 NOTE — Assessment & Plan Note (Signed)
Also for f/u lab  Lab Results  Component Value Date   TSH 4.34 07/13/2022

## 2022-08-05 ENCOUNTER — Other Ambulatory Visit: Payer: Self-pay | Admitting: Internal Medicine

## 2022-10-02 ENCOUNTER — Other Ambulatory Visit: Payer: Self-pay | Admitting: Internal Medicine

## 2022-11-06 ENCOUNTER — Other Ambulatory Visit: Payer: Self-pay | Admitting: Internal Medicine

## 2022-11-06 DIAGNOSIS — K219 Gastro-esophageal reflux disease without esophagitis: Secondary | ICD-10-CM

## 2022-11-06 DIAGNOSIS — I10 Essential (primary) hypertension: Secondary | ICD-10-CM

## 2022-11-06 NOTE — Telephone Encounter (Signed)
Please refill as per office routine med refill policy (all routine meds to be refilled for 3 mo or monthly (per pt preference) up to one year from last visit, then month to month grace period for 3 mo, then further med refills will have to be denied)

## 2022-12-14 ENCOUNTER — Ambulatory Visit (INDEPENDENT_AMBULATORY_CARE_PROVIDER_SITE_OTHER): Payer: Commercial Managed Care - PPO

## 2022-12-14 ENCOUNTER — Telehealth: Payer: Self-pay | Admitting: Internal Medicine

## 2022-12-14 ENCOUNTER — Ambulatory Visit (INDEPENDENT_AMBULATORY_CARE_PROVIDER_SITE_OTHER): Payer: Commercial Managed Care - PPO | Admitting: Internal Medicine

## 2022-12-14 ENCOUNTER — Telehealth: Payer: Self-pay

## 2022-12-14 ENCOUNTER — Other Ambulatory Visit: Payer: Self-pay | Admitting: Internal Medicine

## 2022-12-14 VITALS — BP 132/74 | HR 62 | Temp 98.3°F | Ht 69.0 in | Wt 233.0 lb

## 2022-12-14 DIAGNOSIS — M25552 Pain in left hip: Secondary | ICD-10-CM

## 2022-12-14 DIAGNOSIS — R739 Hyperglycemia, unspecified: Secondary | ICD-10-CM

## 2022-12-14 DIAGNOSIS — E559 Vitamin D deficiency, unspecified: Secondary | ICD-10-CM

## 2022-12-14 DIAGNOSIS — E781 Pure hyperglyceridemia: Secondary | ICD-10-CM

## 2022-12-14 DIAGNOSIS — M5412 Radiculopathy, cervical region: Secondary | ICD-10-CM | POA: Diagnosis not present

## 2022-12-14 DIAGNOSIS — I10 Essential (primary) hypertension: Secondary | ICD-10-CM

## 2022-12-14 DIAGNOSIS — Z125 Encounter for screening for malignant neoplasm of prostate: Secondary | ICD-10-CM | POA: Diagnosis not present

## 2022-12-14 DIAGNOSIS — E538 Deficiency of other specified B group vitamins: Secondary | ICD-10-CM | POA: Diagnosis not present

## 2022-12-14 DIAGNOSIS — Z720 Tobacco use: Secondary | ICD-10-CM

## 2022-12-14 DIAGNOSIS — Z0001 Encounter for general adult medical examination with abnormal findings: Secondary | ICD-10-CM

## 2022-12-14 DIAGNOSIS — G4733 Obstructive sleep apnea (adult) (pediatric): Secondary | ICD-10-CM

## 2022-12-14 LAB — URINALYSIS, ROUTINE W REFLEX MICROSCOPIC
Bilirubin Urine: NEGATIVE
Hgb urine dipstick: NEGATIVE
Ketones, ur: NEGATIVE
Leukocytes,Ua: NEGATIVE
Nitrite: NEGATIVE
Specific Gravity, Urine: 1.015 (ref 1.000–1.030)
Total Protein, Urine: NEGATIVE
Urine Glucose: NEGATIVE
Urobilinogen, UA: 0.2 (ref 0.0–1.0)
pH: 7 (ref 5.0–8.0)

## 2022-12-14 LAB — HEPATIC FUNCTION PANEL
ALT: 34 U/L (ref 0–53)
AST: 48 U/L — ABNORMAL HIGH (ref 0–37)
Albumin: 4.1 g/dL (ref 3.5–5.2)
Alkaline Phosphatase: 74 U/L (ref 39–117)
Bilirubin, Direct: 0.2 mg/dL (ref 0.0–0.3)
Total Bilirubin: 0.6 mg/dL (ref 0.2–1.2)
Total Protein: 7.6 g/dL (ref 6.0–8.3)

## 2022-12-14 LAB — BASIC METABOLIC PANEL
BUN: 10 mg/dL (ref 6–23)
CO2: 25 mEq/L (ref 19–32)
Calcium: 10 mg/dL (ref 8.4–10.5)
Chloride: 100 mEq/L (ref 96–112)
Creatinine, Ser: 0.93 mg/dL (ref 0.40–1.50)
GFR: 91.26 mL/min (ref >60.00)
Glucose, Bld: 81 mg/dL (ref 70–99)
Potassium: 4.3 mEq/L (ref 3.5–5.1)
Sodium: 139 mEq/L (ref 135–145)

## 2022-12-14 LAB — LIPID PANEL
Cholesterol: 135 mg/dL (ref 0–200)
HDL: 26.4 mg/dL — ABNORMAL LOW (ref >39.00)
NonHDL: 108.13
Total CHOL/HDL Ratio: 5
Triglycerides: 220 mg/dL — ABNORMAL HIGH (ref 0.0–149.0)
VLDL: 44 mg/dL — ABNORMAL HIGH (ref 0.0–40.0)

## 2022-12-14 LAB — CBC WITH DIFFERENTIAL/PLATELET
Basophils Absolute: 0.1 10*3/uL (ref 0.0–0.1)
Basophils Relative: 0.3 % (ref 0.0–3.0)
Eosinophils Absolute: 0.5 10*3/uL (ref 0.0–0.7)
Eosinophils Relative: 2.5 % (ref 0.0–5.0)
HCT: 44.9 % (ref 39.0–52.0)
Hemoglobin: 15.1 g/dL (ref 13.0–17.0)
Lymphocytes Relative: 12.4 % (ref 12.0–46.0)
Lymphs Abs: 2.4 10*3/uL (ref 0.7–4.0)
MCHC: 33.6 g/dL (ref 30.0–36.0)
MCV: 91.5 fl (ref 78.0–100.0)
Monocytes Absolute: 1.3 10*3/uL — ABNORMAL HIGH (ref 0.1–1.0)
Monocytes Relative: 6.7 % (ref 3.0–12.0)
Neutro Abs: 15 10*3/uL — ABNORMAL HIGH (ref 1.4–7.7)
Neutrophils Relative %: 78.1 % — ABNORMAL HIGH (ref 43.0–77.0)
Platelets: 268 10*3/uL (ref 150.0–400.0)
RBC: 4.91 Mil/uL (ref 4.22–5.81)
RDW: 14.2 % (ref 11.5–15.5)
WBC: 19.2 10*3/uL (ref 4.0–10.5)

## 2022-12-14 LAB — MICROALBUMIN / CREATININE URINE RATIO
Creatinine,U: 112.6 mg/dL
Microalb Creat Ratio: 0.7 mg/g (ref 0.0–30.0)
Microalb, Ur: 0.8 mg/dL (ref 0.0–1.9)

## 2022-12-14 LAB — VITAMIN B12: Vitamin B-12: 927 pg/mL — ABNORMAL HIGH (ref 211–911)

## 2022-12-14 LAB — TSH: TSH: 4.14 u[IU]/mL (ref 0.35–5.50)

## 2022-12-14 LAB — PSA: PSA: 0.11 ng/mL (ref 0.10–4.00)

## 2022-12-14 LAB — VITAMIN D 25 HYDROXY (VIT D DEFICIENCY, FRACTURES): VITD: 82.3 ng/mL (ref 30.00–100.00)

## 2022-12-14 LAB — LDL CHOLESTEROL, DIRECT: Direct LDL: 86 mg/dL

## 2022-12-14 LAB — HEMOGLOBIN A1C: Hgb A1c MFr Bld: 6.2 % (ref 4.6–6.5)

## 2022-12-14 MED ORDER — ALPRAZOLAM 1 MG PO TABS: ORAL_TABLET | ORAL | 2 refills | Status: DC

## 2022-12-14 MED ORDER — CYCLOBENZAPRINE HCL 5 MG PO TABS: 5.0000 mg | ORAL_TABLET | Freq: Three times a day (TID) | ORAL | 1 refills | Status: DC | PRN

## 2022-12-14 MED ORDER — TRAMADOL HCL 50 MG PO TABS: 50.0000 mg | ORAL_TABLET | Freq: Four times a day (QID) | ORAL | 0 refills | Status: DC | PRN

## 2022-12-14 NOTE — Telephone Encounter (Signed)
Pt requested for his Rx to be refilled:  ALPRAZolam Duanne Moron) 1 MG tablet   Just was seen in office 12/14/22  Preferred pharmacy:   [id number]- [place], [place]

## 2022-12-14 NOTE — Telephone Encounter (Signed)
Done erx 

## 2022-12-14 NOTE — Progress Notes (Signed)
Patient ID: Tanner Gomez, male   DOB: 07-30-1966, 57 y.o.   MRN: [id number]         Chief Complaint:: wellness exam and Physical (Form completion-[employer] yearly form/Hip pain/Numbness of right shoulder)  , moker, keft upper eyelid drooping, low b12, htn, hyperglycemia, hld, smoking, low vit d, osa       HPI:  Tanner Gomez is a 57 y.o. male here for wellness exam; due for pulm referral for LDCT screening, for shingrix at pharmacy, o/w up to date  Still smoking, not ready to quit                        Also has new mild left upper eyelid drooping but no visual change, pain or swelling, and pt was unaware prior to arrival today.  Main complaint is left hip pain moderate for 1 wk, worse to standing and walking, not really assoc with lower back pain or referred pain,  Does also have known right neck and shoulder numbness but no worsening pain or right arm weakness.  Has hx of OSA, no f/u eval in recent years.     Wt Readings from Last 3 Encounters:  12/14/22 233 lb (105.7 kg)  07/13/22 239 lb (108.4 kg)  12/13/21 232 lb (105.2 kg)   BP Readings from Last 3 Encounters:  12/14/22 132/74  07/13/22 134/78  12/13/21 118/76   Immunization History  Administered Date(s) Administered   Influenza,inj,Quad PF,6+ Mos 06/24/2014, 06/30/2015, 06/15/2017, 11/27/2018   PFIZER(Purple Top)SARS-COV-2 Vaccination 04/25/2020, 05/16/2020   Tdap 07/29/2008, 11/27/2018  There are no preventive care reminders to display for this patient.    Past Medical History:  Diagnosis Date   Depression    Hyperlipidemia    Hypertension    Pancreatitis    Tobacco abuse    Past Surgical History:  Procedure Laterality Date   EYE SURGERY     RIGHT (CHILDHOOD)   JOINT REPLACEMENT      reports that he has been smoking cigarettes. He has a 37.00 pack-year smoking history. He has never used smokeless tobacco. He reports current alcohol use of about 33.0 standard drinks of alcohol per week. He reports that he does not  use drugs. family history includes Diabetes in his father; Heart attack in his maternal grandmother, mother, and paternal grandfather; Heart disease in his mother; Hypertension in his father and mother; Kidney Stones in his sister; Kidney disease in his mother. Allergies  Allergen Reactions   Sular Swelling   Current Outpatient Medications on File Prior to Visit  Medication Sig Dispense Refill   amLODipine (NORVASC) 10 MG tablet TAKE 1 TABLET BY MOUTH EVERY DAY 30 tablet 8   ascorbic acid (VITAMIN C) 500 MG tablet Take 500 mg by mouth daily.     atenolol (TENORMIN) 100 MG tablet TAKE 1 TABLET BY MOUTH DAILY. MUST KEEP SCHEDULE APPT W/NEW PROVIDER FOR FUTURE REFILLS 90 tablet 3   atorvastatin (LIPITOR) 80 MG tablet TAKE 1 TABLET BY MOUTH EVERY DAY 30 tablet 8   Cholecalciferol (VITAMIN D) 50 MCG (2000 UT) tablet Take 2,000 Units by mouth daily.     Coenzyme Q10 100 MG capsule Take 100 mg by mouth daily.      cyanocobalamin (VITAMIN B12) 500 MCG tablet Take 500 mcg by mouth daily.     fenofibrate 160 MG tablet TAKE 1 TABLET BY MOUTH EVERY DAY 90 tablet 3   Magnesium 400 MG TABS Take 400 mg by mouth  daily.      NON FORMULARY Take 1 tablet by mouth daily. Full Spectrum Mineral Caps     Omega-3 Fatty Acids (FISH OIL) 1000 MG CAPS Take 4 capsules by mouth daily.     omeprazole (PRILOSEC) 40 MG capsule TAKE 1 CAPSULE BY MOUTH EVERY DAY 30 capsule 8   predniSONE (DELTASONE) 10 MG tablet 2 tabs by mouth per day for 5 days 10 tablet 0   PRESCRIPTION MEDICATION Pt uses CPAP Machine     No current facility-administered medications on file prior to visit.        ROS:  All others reviewed and negative.  Objective        PE:  BP 132/74 (BP Location: Right Arm, Patient Position: Sitting, Cuff Size: Large)   Pulse 62   Temp 98.3 F (36.8 C) (Oral)   Ht '5\' 9"'$  (1.753 m)   Wt 233 lb (105.7 kg)   SpO2 94%   BMI 34.41 kg/m                 Constitutional: Pt appears in NAD               HENT: Head:  NCAT.                Right Ear: External ear normal.                 Left Ear: External ear normal.                Eyes: . Pupils are equal, round, and reactive to light. Conjunctivae and EOM are normal               Nose: without d/c or deformity               Neck: Neck supple. Gross normal ROM               Cardiovascular: Normal rate and regular rhythm.                 Pulmonary/Chest: Effort normal and breath sounds without rales or wheezing.                Abd:  Soft, NT, ND, + BS, no organomegaly               Neurological: Pt is alert. At baseline orientation, motor grossly intact               Skin: Skin is warm. No rashes, no other new lesions, LE edema - none               Psychiatric: Pt behavior is normal without agitation   Micro: none  Cardiac tracings I have personally interpreted today:  none  Pertinent Radiological findings (summarize): none   Lab Results  Component Value Date   WBC 19.2 Repeated and verified X2. (HH) 12/14/2022   HGB 15.1 12/14/2022   HCT 44.9 12/14/2022   PLT 268.0 12/14/2022   GLUCOSE 81 12/14/2022   CHOL 135 12/14/2022   TRIG 220.0 (H) 12/14/2022   HDL 26.40 (L) 12/14/2022   LDLDIRECT 86.0 12/14/2022   LDLCALC 61 05/25/2021   ALT 34 12/14/2022   AST 48 (H) 12/14/2022   NA 139 12/14/2022   K 4.3 12/14/2022   CL 100 12/14/2022   CREATININE 0.93 12/14/2022   BUN 10 12/14/2022   CO2 25 12/14/2022   TSH 4.14 12/14/2022   PSA 0.11 12/14/2022   INR 1.61 (H)  03/07/2011   HGBA1C 6.2 12/14/2022   MICROALBUR 0.8 12/14/2022   Assessment/Plan:  Tanner Gomez is a 57 y.o. White or Caucasian [1] male with  has a past medical history of Depression, Hyperlipidemia, Hypertension, Pancreatitis, and Tobacco abuse.  Encounter for well adult exam with abnormal findings Age and sex appropriate education and counseling updated with regular exercise and diet Referrals for preventative services - none needed Immunizations addressed - for shignrix at the  pharmacy Smoking counseling  - pt counsled to quit, pt not ready Evidence for depression or other mood disorder - none significant Most recent labs reviewed. I have personally reviewed and have noted: 1) the patient's medical and social history 2) The patient's current medications and supplements 3) The patient's height, weight, and BMI have been recorded in the chart   B12 deficiency Lab Results  Component Value Date   VITAMINB12 927 (H) 12/14/2022   Stable, cont oral replacement - b12 1000 mcg qd   Cervical radiculitis Mild chronic recurrent overall stable, declines MRI for now  HTN (hypertension) BP Readings from Last 3 Encounters:  12/14/22 132/74  07/13/22 134/78  12/13/21 118/76   Stable, pt to continue medical treatment norvasc 10 mg qd, tenormin 100 mg qd   Hyperglycemia Lab Results  Component Value Date   HGBA1C 6.2 12/14/2022   Stable, pt to continue current medical treatment  - diet, wt control   Hypertriglyceridemia Lab Results  Component Value Date   LDLCALC 61 05/25/2021   Stable, pt to continue current fenofibrate 140, vascepa, lipitor 80 mg qd   Tobacco abuse Pt counsled to quit, pt not ready; also for pulm referral for LDCT screening  Vitamin D deficiency Last vitamin D Lab Results  Component Value Date   VD25OH 82.30 12/14/2022   Stable, cont oral replacement   OSA (obstructive sleep apnea) For referral pulm for re-evaluation  Left hip pain No recent falls, etiology unclear, for left hip film, r/o djd or other such as AVN, for trial flexeril 5 tid prn  Followup: No follow-ups on file.  Cathlean Cower, MD 12/17/2022 3:45 PM [facility]

## 2022-12-14 NOTE — Patient Instructions (Addendum)
Please have your Shingrix (shingles) shots done at your local pharmacy.  Please take all new medication as prescribed - the tramadol for pain and muscle relaxer as needed  Please continue to watch the left upper eyelid drooping  Please continue all other medications as before, and refills have been done if requested.  Please have the pharmacy call with any other refills you may need.  Please continue your efforts at being more active, low cholesterol diet, and weight control.  You are otherwise up to date with prevention measures today.  Please keep your appointments with your specialists as you may have planned  You will be contacted regarding the referral for: Pulmonary for sleep apnea and LDCT lung cancer screening, and Sports medicine for the neck and left hip  Please go to the XRAY Department in the first floor for the x-ray testing  Please go to the LAB at the blood drawing area for the tests to be done  You will be contacted by phone if any changes need to be made immediately.  Otherwise, you will receive a letter about your results with an explanation, but please check with MyChart first.  Please remember to sign up for MyChart if you have not done so, as this will be important to you in the future with finding out test results, communicating by private email, and scheduling acute appointments online when needed.  Please make an Appointment to return in 6 months, or sooner if needed

## 2022-12-14 NOTE — Telephone Encounter (Signed)
CRITICAL VALUE STICKER  CRITICAL VALUE: WBC 19.2  RECEIVER (on-site recipient of call): Verdis Frederickson  DATE & TIME NOTIFIED: 12/14/2022, 11:05 am  MESSENGER (representative from lab): Santiago Glad  MD NOTIFIED: Yes  TIME OF NOTIFICATION: 11:05 am  RESPONSE:  Provider will contact pt

## 2022-12-14 NOTE — Telephone Encounter (Signed)
Ok this is consistent with his previous chronically elevated wbc - ok to watch for now

## 2022-12-16 ENCOUNTER — Other Ambulatory Visit: Payer: Self-pay | Admitting: Internal Medicine

## 2022-12-16 DIAGNOSIS — M87052 Idiopathic aseptic necrosis of left femur: Secondary | ICD-10-CM

## 2022-12-17 ENCOUNTER — Encounter: Payer: Self-pay | Admitting: Internal Medicine

## 2022-12-17 NOTE — Assessment & Plan Note (Signed)
Age and sex appropriate education and counseling updated with regular exercise and diet Referrals for preventative services - none needed Immunizations addressed - for shignrix at the pharmacy Smoking counseling  - pt counsled to quit, pt not ready Evidence for depression or other mood disorder - none significant Most recent labs reviewed. I have personally reviewed and have noted: 1) the patient's medical and social history 2) The patient's current medications and supplements 3) The patient's height, weight, and BMI have been recorded in the chart

## 2022-12-17 NOTE — Assessment & Plan Note (Addendum)
No recent falls, etiology unclear, for left hip film, r/o djd or other such as AVN, for trial flexeril 5 tid prn

## 2022-12-17 NOTE — Assessment & Plan Note (Signed)
Lab Results  Component Value Date   HGBA1C 6.2 12/14/2022   Stable, pt to continue current medical treatment  - diet, wt control

## 2022-12-17 NOTE — Assessment & Plan Note (Signed)
BP Readings from Last 3 Encounters:  12/14/22 132/74  07/13/22 134/78  12/13/21 118/76   Stable, pt to continue medical treatment norvasc 10 mg qd, tenormin 100 mg qd

## 2022-12-17 NOTE — Assessment & Plan Note (Addendum)
Pt counsled to quit, pt not ready; also for pulm referral for LDCT screening

## 2022-12-17 NOTE — Assessment & Plan Note (Signed)
Mild chronic recurrent overall stable, declines MRI for now

## 2022-12-17 NOTE — Assessment & Plan Note (Signed)
Last vitamin D Lab Results  Component Value Date   VD25OH 82.30 12/14/2022   Stable, cont oral replacement

## 2022-12-17 NOTE — Assessment & Plan Note (Signed)
For referral pulm for re-evaluation

## 2022-12-17 NOTE — Assessment & Plan Note (Signed)
Lab Results  Component Value Date   [id number] (H) 12/14/2022   Stable, cont oral replacement - b12 1000 mcg qd

## 2022-12-17 NOTE — Assessment & Plan Note (Signed)
Lab Results  Component Value Date   LDLCALC 61 05/25/2021   Stable, pt to continue current fenofibrate 140, vascepa, lipitor 80 mg qd

## 2022-12-21 ENCOUNTER — Ambulatory Visit: Payer: Commercial Managed Care - PPO | Admitting: Family Medicine

## 2023-01-03 ENCOUNTER — Other Ambulatory Visit: Payer: Self-pay | Admitting: Internal Medicine

## 2023-01-03 DIAGNOSIS — I10 Essential (primary) hypertension: Secondary | ICD-10-CM

## 2023-01-23 ENCOUNTER — Telehealth: Payer: Self-pay | Admitting: Internal Medicine

## 2023-01-23 NOTE — Telephone Encounter (Signed)
Patient called and said Dr. Raphael Gibney nurse filled out paperwork to send appointment visit notes to [facility] after his lis appointment on 12/14/22. It was faxed to them and they received it, but patient just spoke with them and they said they lost the paperwork. He would like to know if it can be re-faxed. Their fax number is [phone]. Best callback for pt is [phone].

## 2023-01-24 NOTE — Telephone Encounter (Signed)
Informed patient that we will resend form to the previous place it was originally sent to

## 2023-01-24 NOTE — Telephone Encounter (Signed)
Patient called to check on the status of this paperwork. He said they will take away his coverage soon if it is not sent soon. He would like a callback at [phone].

## 2023-01-30 NOTE — Telephone Encounter (Signed)
Received contact info from patient for [employer] [facility], info needed verified by phone

## 2023-01-30 NOTE — Telephone Encounter (Signed)
Please resend

## 2023-02-24 ENCOUNTER — Other Ambulatory Visit: Payer: Self-pay | Admitting: Internal Medicine

## 2023-02-27 ENCOUNTER — Telehealth: Payer: Self-pay | Admitting: Internal Medicine

## 2023-02-27 NOTE — Telephone Encounter (Signed)
Patient appt set for July 5   Last seen 05/2021. Was advised on appt for refills.  Please see request dosage and approval to send.

## 2023-02-28 MED ORDER — ICOSAPENT ETHYL 1 G PO CAPS: 2.0000 g | ORAL_CAPSULE | Freq: Two times a day (BID) | ORAL | 3 refills | Status: DC

## 2023-02-28 NOTE — Telephone Encounter (Signed)
RX sent for amount approved by MD.

## 2023-03-07 ENCOUNTER — Telehealth: Payer: Self-pay | Admitting: Internal Medicine

## 2023-03-07 DIAGNOSIS — E782 Mixed hyperlipidemia: Secondary | ICD-10-CM

## 2023-03-07 NOTE — Telephone Encounter (Signed)
Patient would like labs ordered so he can have them done prior to appt. In October.  Please advise which labs as he had quite a few for last visit.  Will call patient once labs are ordered.

## 2023-03-07 NOTE — Telephone Encounter (Signed)
Patient wants current orders for blood work prior to his appointment on 10/4.

## 2023-03-09 NOTE — Telephone Encounter (Signed)
Spoke with patient. Let him know Lab was ordered and to be fasting for this lab.  He states understanding

## 2023-03-13 ENCOUNTER — Other Ambulatory Visit: Payer: Self-pay | Admitting: *Deleted

## 2023-03-13 DIAGNOSIS — E782 Mixed hyperlipidemia: Secondary | ICD-10-CM

## 2023-04-03 ENCOUNTER — Other Ambulatory Visit: Payer: Self-pay | Admitting: Internal Medicine

## 2023-04-20 ENCOUNTER — Ambulatory Visit: Payer: Commercial Managed Care - PPO | Admitting: Internal Medicine

## 2023-04-26 ENCOUNTER — Other Ambulatory Visit: Payer: Self-pay | Admitting: *Deleted

## 2023-04-26 DIAGNOSIS — Z122 Encounter for screening for malignant neoplasm of respiratory organs: Secondary | ICD-10-CM

## 2023-04-26 DIAGNOSIS — Z87891 Personal history of nicotine dependence: Secondary | ICD-10-CM

## 2023-04-26 DIAGNOSIS — F17210 Nicotine dependence, cigarettes, uncomplicated: Secondary | ICD-10-CM

## 2023-05-16 ENCOUNTER — Encounter (INDEPENDENT_AMBULATORY_CARE_PROVIDER_SITE_OTHER): Payer: Self-pay

## 2023-05-25 ENCOUNTER — Encounter: Payer: Self-pay | Admitting: Physician Assistant

## 2023-05-25 ENCOUNTER — Ambulatory Visit: Payer: Commercial Managed Care - PPO | Admitting: Physician Assistant

## 2023-05-25 DIAGNOSIS — F17210 Nicotine dependence, cigarettes, uncomplicated: Secondary | ICD-10-CM

## 2023-05-25 NOTE — Progress Notes (Signed)
Virtual Visit via Telephone Note  I connected with Tanner Gomez on 05/25/23 at  11:14 AM by telephone and verified that I am speaking with the correct person using two identifiers.  Location: Patient: home Provider: working virtually from home   I discussed the limitations, risks, security and privacy concerns of performing an evaluation and management service by telephone and the availability of in person appointments. I also discussed with the patient that there may be a patient responsible charge related to this service. The patient expressed understanding and agreed to proceed.     Shared Decision Making Visit Lung Cancer Screening Program [phone])   Eligibility: Age 57 Pack Years Smoking History Calculation 25 (# packs/per year x # years smoked) Recent History of coughing up blood  No Unexplained weight loss? No ( >Than 15 pounds within the last 6 months ) Prior History Lung / other cancer No (Diagnosis within the last 5 years already requiring surveillance chest CT Scans). Smoking Status Current Smoker  Visit Components: Discussion included one or more decision making aids. Yes Discussion included risk/benefits of screening. Yes Discussion included potential follow up diagnostic testing for abnormal scans. Yes Discussion included meaning and risk of over diagnosis. Yes Discussion included meaning and risk of False Positives. Yes Discussion included meaning of total radiation exposure. Yes  Counseling Included: Importance of adherence to annual lung cancer LDCT screening. Yes Impact of comorbidities on ability to participate in the program. Yes Ability and willingness to under diagnostic treatment: Yes  Smoking Cessation Counseling: Current Smokers:  Discussed importance of smoking cessation. Yes Information about tobacco cessation classes and interventions provided to patient. Yes Symptomatic Patient. No Diagnosis Code: Tobacco Use Z72.0 Asymptomatic Patient  Yes  Counseling (Intermediate counseling: > three minutes counseling) [id number] Information about tobacco cessation classes and interventions provided to patient. Yes Written Order for Lung Cancer Screening with LDCT placed in Epic. Yes (CT Chest Lung Cancer Screening Low Dose W/O CM) [id number] Z12.2-Screening of respiratory organs Z87.891-Personal history of nicotine dependence   I have spent 25 minutes of face to face/ virtual visit  time with the patient discussing the risks and benefits of lung cancer screening. We discussed the above noted topics. We paused at intervals to allow for questions to be asked and answered to ensure understanding.We discussed that the single most powerful action that anyone can take to decrease their risk of developing lung cancer is to quit smoking.  We discussed options for tools to aid in quitting smoking including nicotine replacement therapy, non-nicotine medications, support groups, Quit Smart classes, and behavior modification. We discussed that often times setting smaller, more achievable goals, such as eliminating 1 cigarette a day for a week and then 2 cigarettes a day for a week can be helpful in slowly decreasing the number of cigarettes smoked. I provided  them  with smoking cessation  information  with contact information for community resources, classes, free nicotine replacement therapy, and access to mobile apps, text messaging, and on-line smoking cessation help. I have also provided  them  the office contact information in the event they have any questions. We discussed the time and location of the scan, and that either Tanner Miyamoto RN, Karlton Lemon, RN  or I will call / send a letter with the results within 24-72 hours of receiving them. The patient verbalized understanding of all of  the above and had no further questions upon leaving the office. They have my contact information in the event they have any further  questions.  I spent 2 minutes counseling  on smoking cessation and the health risks of continued tobacco abuse.  I explained to the patient that there has been a high incidence of coronary artery disease noted on these exams. I explained that this is a non-gated exam therefore degree or severity cannot be determined. This patient is on statin therapy. I have asked the patient to follow-up with their PCP regarding any incidental finding of coronary artery disease and management with diet or medication as their PCP  feels is clinically indicated. The patient verbalized understanding of the above and had no further questions upon completion of the visit.    [name], PA-C

## 2023-05-25 NOTE — Patient Instructions (Signed)
 Thank you for participating in the [facility]. It was our pleasure to meet you today. We will call you with the results of your scan within the next few days. Your scan will be assigned a Lung RADS category score by the physicians reading the scans.  This Lung RADS score determines follow up scanning.  See below for description of categories, and follow up screening recommendations. We will be in touch to schedule your follow up screening annually or based on recommendations of our providers. We will fax a copy of your scan results to your Primary Care Physician, or the physician who referred you to the program, to ensure they have the results. Please call the office if you have any questions or concerns regarding your scanning experience or results.  Our office number is [phone]. Please speak with Abigail Miyamoto, RN. , or  Karlton Lemon RN, They are  our Lung Cancer Screening RN.'s If They are unavailable when you call, Please leave a message on the voice mail. We will return your call at our earliest convenience.This voice mail is monitored several times a day.  Remember, if your scan is normal, we will scan you annually as long as you continue to meet the criteria for the program. (Age 58-80, Current smoker or smoker who has quit within the last 15 years). If you are a smoker, remember, quitting is the single most powerful action that you can take to decrease your risk of lung cancer and other pulmonary, breathing related problems. We know quitting is hard, and we are here to help.  Please let us know if there is anything we can do to help you meet your goal of quitting. If you are a former smoker, Counselling psychologist. We are proud of you! Remain smoke free! Remember you can refer friends or family members through the number above.  We will screen them to make sure they meet criteria for the program. Thank you for helping [place] take better care of you by  participating in Lung Screening.   Lung RADS Categories:  Lung RADS 1: no nodules or definitely non-concerning nodules.  Recommendation is for a repeat annual scan in 12 months.  Lung RADS 2:  nodules that are non-concerning in appearance and behavior with a very low likelihood of becoming an active cancer. Recommendation is for a repeat annual scan in 12 months.  Lung RADS 3: nodules that are probably non-concerning , includes nodules with a low likelihood of becoming an active cancer.  Recommendation is for a 32-month repeat screening scan. Often noted after an upper respiratory illness. We will be in touch to make sure you have no questions, and to schedule your 31-month scan.  Lung RADS 4 A: nodules with concerning findings, recommendation is most often for a follow up scan in 3 months or additional testing based on our provider's assessment of the scan. We will be in touch to make sure you have no questions and to schedule the recommended 3 month follow up scan.  Lung RADS 4 B:  indicates findings that are concerning. We will be in touch with you to schedule additional diagnostic testing based on our provider's  assessment of the scan.  You can receive free nicotine replacement therapy ( patches, gum or mints) by calling 1-800-QUIT NOW. Please call so we can get you on the path to becoming  a non-smoker. I know it is hard, but you can do this!  Other options for assistance in smoking  cessation ( As covered by your insurance benefits)  Hypnosis for smoking cessation  [employer] [phone]  Acupuncture for smoking cessation  [employer] [phone]

## 2023-05-28 ENCOUNTER — Encounter (HOSPITAL_COMMUNITY): Payer: Self-pay

## 2023-05-28 ENCOUNTER — Ambulatory Visit (HOSPITAL_COMMUNITY)
Admission: RE | Admit: 2023-05-28 | Discharge: 2023-05-28 | Disposition: A | Discharge status: Home / Self Care | Payer: Commercial Managed Care - PPO | Source: Ambulatory Visit | Attending: Acute Care | Admitting: Acute Care

## 2023-05-28 DIAGNOSIS — Z122 Encounter for screening for malignant neoplasm of respiratory organs: Secondary | ICD-10-CM | POA: Insufficient documentation

## 2023-05-28 DIAGNOSIS — F17210 Nicotine dependence, cigarettes, uncomplicated: Secondary | ICD-10-CM | POA: Insufficient documentation

## 2023-05-28 DIAGNOSIS — Z87891 Personal history of nicotine dependence: Secondary | ICD-10-CM | POA: Insufficient documentation

## 2023-05-29 ENCOUNTER — Other Ambulatory Visit: Payer: Self-pay

## 2023-05-29 DIAGNOSIS — F17210 Nicotine dependence, cigarettes, uncomplicated: Secondary | ICD-10-CM

## 2023-05-29 DIAGNOSIS — Z122 Encounter for screening for malignant neoplasm of respiratory organs: Secondary | ICD-10-CM

## 2023-05-29 DIAGNOSIS — Z87891 Personal history of nicotine dependence: Secondary | ICD-10-CM

## 2023-06-14 ENCOUNTER — Encounter: Payer: Self-pay | Admitting: Internal Medicine

## 2023-06-14 ENCOUNTER — Ambulatory Visit: Payer: Commercial Managed Care - PPO | Admitting: Internal Medicine

## 2023-06-14 VITALS — BP 122/78 | HR 65 | Temp 98.3°F | Ht 69.0 in | Wt 236.0 lb

## 2023-06-14 DIAGNOSIS — E559 Vitamin D deficiency, unspecified: Secondary | ICD-10-CM

## 2023-06-14 DIAGNOSIS — M8700 Idiopathic aseptic necrosis of unspecified bone: Secondary | ICD-10-CM | POA: Diagnosis not present

## 2023-06-14 DIAGNOSIS — E538 Deficiency of other specified B group vitamins: Secondary | ICD-10-CM

## 2023-06-14 DIAGNOSIS — N529 Male erectile dysfunction, unspecified: Secondary | ICD-10-CM

## 2023-06-14 DIAGNOSIS — I10 Essential (primary) hypertension: Secondary | ICD-10-CM

## 2023-06-14 DIAGNOSIS — E781 Pure hyperglyceridemia: Secondary | ICD-10-CM | POA: Diagnosis not present

## 2023-06-14 DIAGNOSIS — I2510 Atherosclerotic heart disease of native coronary artery without angina pectoris: Secondary | ICD-10-CM

## 2023-06-14 DIAGNOSIS — R739 Hyperglycemia, unspecified: Secondary | ICD-10-CM | POA: Diagnosis not present

## 2023-06-14 DIAGNOSIS — F411 Generalized anxiety disorder: Secondary | ICD-10-CM

## 2023-06-14 DIAGNOSIS — Z720 Tobacco use: Secondary | ICD-10-CM | POA: Diagnosis not present

## 2023-06-14 LAB — BASIC METABOLIC PANEL
BUN: 11 mg/dL (ref 6–23)
CO2: 26 mEq/L (ref 19–32)
Calcium: 10 mg/dL (ref 8.4–10.5)
Chloride: 100 mEq/L (ref 96–112)
Creatinine, Ser: 0.87 mg/dL (ref 0.40–1.50)
GFR: 95.57 mL/min (ref >60.00)
Glucose, Bld: 98 mg/dL (ref 70–99)
Potassium: 4 mEq/L (ref 3.5–5.1)
Sodium: 135 mEq/L (ref 135–145)

## 2023-06-14 LAB — LIPID PANEL
Cholesterol: 123 mg/dL (ref 0–200)
HDL: 23.5 mg/dL — ABNORMAL LOW (ref >39.00)
NonHDL: 99.63
Total CHOL/HDL Ratio: 5
Triglycerides: 268 mg/dL — ABNORMAL HIGH (ref 0.0–149.0)
VLDL: 53.6 mg/dL — ABNORMAL HIGH (ref 0.0–40.0)

## 2023-06-14 LAB — CBC WITH DIFFERENTIAL/PLATELET
Basophils Absolute: 0.1 10*3/uL (ref 0.0–0.1)
Basophils Relative: 0.3 % (ref 0.0–3.0)
Eosinophils Absolute: 0.4 10*3/uL (ref 0.0–0.7)
Eosinophils Relative: 2.4 % (ref 0.0–5.0)
HCT: 46 % (ref 39.0–52.0)
Hemoglobin: 14.8 g/dL (ref 13.0–17.0)
Lymphocytes Relative: 13.9 % (ref 12.0–46.0)
Lymphs Abs: 2.5 10*3/uL (ref 0.7–4.0)
MCHC: 32.2 g/dL (ref 30.0–36.0)
MCV: 92.1 fl (ref 78.0–100.0)
Monocytes Absolute: 1.3 10*3/uL — ABNORMAL HIGH (ref 0.1–1.0)
Monocytes Relative: 7.5 % (ref 3.0–12.0)
Neutro Abs: 13.5 10*3/uL — ABNORMAL HIGH (ref 1.4–7.7)
Neutrophils Relative %: 75.9 % (ref 43.0–77.0)
Platelets: 287 10*3/uL (ref 150.0–400.0)
RBC: 4.99 Mil/uL (ref 4.22–5.81)
RDW: 13.8 % (ref 11.5–15.5)
WBC: 17.8 10*3/uL — ABNORMAL HIGH (ref 4.0–10.5)

## 2023-06-14 LAB — HEPATIC FUNCTION PANEL
ALT: 37 U/L (ref 0–53)
AST: 50 U/L — ABNORMAL HIGH (ref 0–37)
Albumin: 4.4 g/dL (ref 3.5–5.2)
Alkaline Phosphatase: 76 U/L (ref 39–117)
Bilirubin, Direct: 0.2 mg/dL (ref 0.0–0.3)
Total Bilirubin: 0.6 mg/dL (ref 0.2–1.2)
Total Protein: 8.2 g/dL (ref 6.0–8.3)

## 2023-06-14 LAB — LDL CHOLESTEROL, DIRECT: Direct LDL: 82 mg/dL

## 2023-06-14 LAB — HEMOGLOBIN A1C: Hgb A1c MFr Bld: 6.2 % (ref 4.6–6.5)

## 2023-06-14 MED ORDER — VARENICLINE TARTRATE (STARTER) 0.5 MG X 11 & 1 MG X 42 PO TBPK: ORAL_TABLET | ORAL | 0 refills | Status: DC

## 2023-06-14 MED ORDER — ALPRAZOLAM 1 MG PO TABS: ORAL_TABLET | ORAL | 2 refills | Status: DC

## 2023-06-14 MED ORDER — SILDENAFIL CITRATE 100 MG PO TABS: 50.0000 mg | ORAL_TABLET | Freq: Every day | ORAL | 11 refills | Status: DC | PRN

## 2023-06-14 MED ORDER — VARENICLINE TARTRATE 1 MG PO TABS: 1.0000 mg | ORAL_TABLET | Freq: Two times a day (BID) | ORAL | 1 refills | Status: DC

## 2023-06-14 NOTE — Progress Notes (Signed)
Patient ID: Tanner Gomez, male   DOB: May 13, 1966, 57 y.o.   MRN: [id number]        Chief Complaint: follow up smoking, left hip AVN, hld, ED       HPI:  Tanner Gomez is a 57 y.o. male here with c/o left hip and back pain.  Did see ortho for AVN left hip and mild bilateral hip arthritis, but not felt to need surgury at the time. Was considered for left hip replacement but as he has little to no pain , this was deferred.    Pt continues to have recurring LBP without change in severity, bowel or bladder change, fever, wt loss,  worsening LE pain/numbness/weakness, gait change or falls.  Still smoking, now ready to try to quit, ok for chantix start.  Trying to follow lower chol diet, but sometimes misses pm dose of vascepa.  Willing for CT card score.  Also with several months worsening ED symptoms, unable to finish.  Denies worsening depressive symptoms, suicidal ideation, or panic; has ongoing anxiety, chronic persistent mild worsening recently with trying to quit smoking.  Pt denies chest pain, increased sob or doe, wheezing, orthopnea, PND, increased LE swelling, palpitations, dizziness or syncope.   Pt denies polydipsia, polyuria, or new focal neuro s/s.    Wt Readings from Last 3 Encounters:  06/14/23 236 lb (107 kg)  12/14/22 233 lb (105.7 kg)  07/13/22 239 lb (108.4 kg)   BP Readings from Last 3 Encounters:  06/14/23 122/78  12/14/22 132/74  07/13/22 134/78         Past Medical History:  Diagnosis Date   Depression    Hyperlipidemia    Hypertension    Pancreatitis    Tobacco abuse    Past Surgical History:  Procedure Laterality Date   EYE SURGERY     RIGHT (CHILDHOOD)   JOINT REPLACEMENT      reports that he has been smoking cigarettes. He has a 74 pack-year smoking history. He has never used smokeless tobacco. He reports current alcohol use of about 33.0 standard drinks of alcohol per week. He reports that he does not use drugs. family history includes Diabetes in his father;  Heart attack in his maternal grandmother, mother, and paternal grandfather; Heart disease in his mother; Hypertension in his father and mother; Kidney Stones in his sister; Kidney disease in his mother. Allergies  Allergen Reactions   Sular Swelling   Current Outpatient Medications on File Prior to Visit  Medication Sig Dispense Refill   amLODipine (NORVASC) 10 MG tablet TAKE 1 TABLET BY MOUTH EVERY DAY 30 tablet 8   ascorbic acid (VITAMIN C) 500 MG tablet Take 500 mg by mouth daily.     atenolol (TENORMIN) 100 MG tablet TAKE 1 TABLET BY MOUTH DAILY. MUST KEEP SCHEDULE APPT W/NEW PROVIDER FOR FUTURE REFILLS 30 tablet 11   atorvastatin (LIPITOR) 80 MG tablet TAKE 1 TABLET BY MOUTH EVERY DAY 30 tablet 8   Cholecalciferol (VITAMIN D) 50 MCG (2000 UT) tablet Take 2,000 Units by mouth daily.     Coenzyme Q10 100 MG capsule Take 100 mg by mouth daily.      cyanocobalamin (VITAMIN B12) 500 MCG tablet Take 500 mcg by mouth daily.     cyclobenzaprine (FLEXERIL) 5 MG tablet Take 1 tablet (5 mg total) by mouth 3 (three) times daily as needed for muscle spasms. 40 tablet 1   fenofibrate 160 MG tablet TAKE 1 TABLET BY MOUTH EVERY DAY 30 tablet  11   icosapent Ethyl (VASCEPA) 1 g capsule Take 2 capsules (2 g total) by mouth 2 (two) times daily. Keep follow up with provider 120 capsule 3   Magnesium 400 MG TABS Take 400 mg by mouth daily.      NON FORMULARY Take 1 tablet by mouth daily. Full Spectrum Mineral Caps     Omega-3 Fatty Acids (FISH OIL) 1000 MG CAPS Take 4 capsules by mouth daily.     omeprazole (PRILOSEC) 40 MG capsule TAKE 1 CAPSULE BY MOUTH EVERY DAY 30 capsule 8   predniSONE (DELTASONE) 10 MG tablet 2 tabs by mouth per day for 5 days 10 tablet 0   PRESCRIPTION MEDICATION Pt uses CPAP Machine     traMADol (ULTRAM) 50 MG tablet Take 1 tablet (50 mg total) by mouth every 6 (six) hours as needed. 30 tablet 0   No current facility-administered medications on file prior to visit.        ROS:  All  others reviewed and negative.  Objective        PE:  BP 122/78 (BP Location: Left Arm, Patient Position: Sitting, Cuff Size: Normal)   Pulse 65   Temp 98.3 F (36.8 C) (Oral)   Ht 5\' 9"  (1.753 m)   Wt 236 lb (107 kg)   SpO2 97%   BMI 34.85 kg/m                 Constitutional: Pt appears in NAD               HENT: Head: NCAT.                Right Ear: External ear normal.                 Left Ear: External ear normal.                Eyes: . Pupils are equal, round, and reactive to light. Conjunctivae and EOM are normal               Nose: without d/c or deformity               Neck: Neck supple. Gross normal ROM               Cardiovascular: Normal rate and regular rhythm.                 Pulmonary/Chest: Effort normal and breath sounds without rales or wheezing.                Abd:  Soft, NT, ND, + BS, no organomegaly               Neurological: Pt is alert. At baseline orientation, motor grossly intact               Skin: Skin is warm. No rashes, no other new lesions, LE edema -                Psychiatric: Pt behavior is normal without agitation   Micro: none  Cardiac tracings I have personally interpreted today:  none  Pertinent Radiological findings (summarize): none   Lab Results  Component Value Date   WBC 17.8 (H) 06/14/2023   HGB 14.8 06/14/2023   HCT 46.0 06/14/2023   PLT 287.0 06/14/2023   GLUCOSE 98 06/14/2023   CHOL 123 06/14/2023   TRIG 268.0 (H) 06/14/2023   HDL 23.50 (L) 06/14/2023   LDLDIRECT 82.0 06/14/2023  LDLCALC 61 05/25/2021   ALT 37 06/14/2023   AST 50 (H) 06/14/2023   NA 135 06/14/2023   K 4.0 06/14/2023   CL 100 06/14/2023   CREATININE 0.87 06/14/2023   BUN 11 06/14/2023   CO2 26 06/14/2023   TSH 4.14 12/14/2022   PSA 0.11 12/14/2022   INR 1.61 (H) 03/07/2011   HGBA1C 6.2 06/14/2023   MICROALBUR 0.8 12/14/2022   Assessment/Plan:  Tanner Gomez is a 57 y.o. White or Caucasian [1] male with  has a past medical history of Depression,  Hyperlipidemia, Hypertension, Pancreatitis, and Tobacco abuse.  Coronary artery calcification seen on CT scan Non quantified coronary Ca+2 noted on last CT , now for CT cardiac chest to quantify  B12 deficiency Lab Results  Component Value Date   VITAMINB12 927 (H) 12/14/2022   Stable, cont oral replacement - b12 1000 mcg qd   HTN (hypertension) BP Readings from Last 3 Encounters:  06/14/23 122/78  12/14/22 132/74  07/13/22 134/78   Stable, pt to continue medical treatment norvasc 10 every day, tenormin 100 qd   Hyperglycemia Lab Results  Component Value Date   HGBA1C 6.2 06/14/2023   Stable, pt to continue current medical treatment  - diet, wt control   Hypertriglyceridemia Lab Results  Component Value Date   CHOL 123 06/14/2023   HDL 23.50 (L) 06/14/2023   LDLCALC 61 05/25/2021   LDLDIRECT 82.0 06/14/2023   TRIG 268.0 (H) 06/14/2023   CHOLHDL 5 06/14/2023   Mild, for lower chol dietm, also for card ct score  Tobacco abuse Ok for chantix starter pack asd,  to f/u any worsening symptoms or concerns   Vitamin D deficiency Last vitamin D Lab Results  Component Value Date   VD25OH 82.30 12/14/2022   Stable, cont oral replacement   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  Oliver Barre, MD 06/16/2023 8:35 PM [facility] - [facility]

## 2023-06-14 NOTE — Patient Instructions (Signed)
Please take all new medication as prescribed - the chantix for smoking, and viagra as needed  Please continue all other medications as before, and refills have been done for the xanax  Please have the pharmacy call with any other refills you may need.  Please continue your efforts at being more active, low cholesterol diet, and weight control.  Please keep your appointments with your specialists as you may have planned  You will be contacted regarding the referral for: Cardiac CT score  Please go to the LAB at the blood drawing area for the tests to be done  You will be contacted by phone if any changes need to be made immediately.  Otherwise, you will receive a letter about your results with an explanation, but please check with MyChart first.  Please make an Appointment to return in 6 months, or sooner if needed, also with Lab Appointment for testing done 3-5 days before at the FIRST FLOOR Lab (so this is for TWO appointments - please see the scheduling desk as you leave)  Please make an Appointment to return in 6 months, or sooner if needed

## 2023-06-14 NOTE — Progress Notes (Signed)
The test results show that your current treatment is OK, as the tests are stable.  Please continue the same plan.  There is no other need for change of treatment or further evaluation based on these results, at this time.  thanks 

## 2023-06-16 ENCOUNTER — Encounter: Payer: Self-pay | Admitting: Internal Medicine

## 2023-06-16 DIAGNOSIS — M8700 Idiopathic aseptic necrosis of unspecified bone: Secondary | ICD-10-CM | POA: Insufficient documentation

## 2023-06-16 DIAGNOSIS — N529 Male erectile dysfunction, unspecified: Secondary | ICD-10-CM | POA: Insufficient documentation

## 2023-06-16 NOTE — Assessment & Plan Note (Signed)
BP Readings from Last 3 Encounters:  06/14/23 122/78  12/14/22 132/74  07/13/22 134/78   Stable, pt to continue medical treatment norvasc 10 every day, tenormin 100 qd

## 2023-06-16 NOTE — Assessment & Plan Note (Signed)
Lab Results  Component Value Date   [id number] (H) 12/14/2022   Stable, cont oral replacement - b12 1000 mcg qd

## 2023-06-16 NOTE — Assessment & Plan Note (Signed)
Mild to mod, for viagra prn,  to f/u any worsening symptoms or concerns 

## 2023-06-16 NOTE — Assessment & Plan Note (Signed)
Lab Results  Component Value Date   HGBA1C 6.2 06/14/2023   Stable, pt to continue current medical treatment  - diet, wt control

## 2023-06-16 NOTE — Assessment & Plan Note (Signed)
Last vitamin D Lab Results  Component Value Date   VD25OH 82.30 12/14/2022   Stable, cont oral replacement

## 2023-06-16 NOTE — Assessment & Plan Note (Signed)
Non quantified coronary Ca+2 noted on last CT , now for CT cardiac chest to quantify

## 2023-06-16 NOTE — Assessment & Plan Note (Signed)
Mild to mod, for xanax prn,  to f/u any worsening symptoms or concerns

## 2023-06-16 NOTE — Assessment & Plan Note (Signed)
Left hip, currently asympt, f/u ortho for worsening

## 2023-06-16 NOTE — Assessment & Plan Note (Addendum)
Lab Results  Component Value Date   CHOL 123 06/14/2023   HDL 23.50 (L) 06/14/2023   LDLCALC 61 05/25/2021   LDLDIRECT 82.0 06/14/2023   TRIG 268.0 (H) 06/14/2023   CHOLHDL 5 06/14/2023   Mild, for lower chol dietm, also for card ct score

## 2023-06-16 NOTE — Assessment & Plan Note (Signed)
Ok for chantix starter pack asd,  to f/u any worsening symptoms or concerns

## 2023-06-27 ENCOUNTER — Other Ambulatory Visit: Payer: Self-pay | Admitting: Internal Medicine

## 2023-07-05 ENCOUNTER — Telehealth: Payer: Self-pay | Admitting: Internal Medicine

## 2023-07-05 ENCOUNTER — Ambulatory Visit (HOSPITAL_BASED_OUTPATIENT_CLINIC_OR_DEPARTMENT_OTHER)
Admission: RE | Admit: 2023-07-05 | Discharge: 2023-07-05 | Disposition: A | Discharge status: Home / Self Care | Payer: Commercial Managed Care - PPO | Source: Ambulatory Visit | Attending: Internal Medicine | Admitting: Internal Medicine

## 2023-07-05 ENCOUNTER — Other Ambulatory Visit: Payer: Self-pay | Admitting: Internal Medicine

## 2023-07-05 DIAGNOSIS — E781 Pure hyperglyceridemia: Secondary | ICD-10-CM | POA: Insufficient documentation

## 2023-07-05 DIAGNOSIS — R739 Hyperglycemia, unspecified: Secondary | ICD-10-CM | POA: Insufficient documentation

## 2023-07-05 DIAGNOSIS — I2510 Atherosclerotic heart disease of native coronary artery without angina pectoris: Secondary | ICD-10-CM | POA: Insufficient documentation

## 2023-07-05 DIAGNOSIS — Z720 Tobacco use: Secondary | ICD-10-CM | POA: Insufficient documentation

## 2023-07-05 DIAGNOSIS — R931 Abnormal findings on diagnostic imaging of heart and coronary circulation: Secondary | ICD-10-CM

## 2023-07-05 DIAGNOSIS — I10 Essential (primary) hypertension: Secondary | ICD-10-CM | POA: Insufficient documentation

## 2023-07-05 MED ORDER — ASPIRIN 81 MG PO TBEC: 81.0000 mg | DELAYED_RELEASE_TABLET | Freq: Every day | ORAL | 11 refills | Status: DC

## 2023-07-05 NOTE — Telephone Encounter (Signed)
Patient states he is returning a call to discuss CT results.

## 2023-07-05 NOTE — Telephone Encounter (Signed)
Spoke to patient stated Dr.James Jonny Ruiz wanted him to see cardiology soon for severe abnormal coronary calcium score.He is not having any chest pain or sob.Appointment scheduled with Joni Reining DNP tomorrow 9/20 at 8:25 am.

## 2023-07-06 ENCOUNTER — Encounter: Payer: Self-pay | Admitting: Adult Health

## 2023-07-06 ENCOUNTER — Ambulatory Visit: Payer: Commercial Managed Care - PPO | Attending: Adult Health | Admitting: Adult Health

## 2023-07-06 VITALS — BP 134/70 | HR 63 | Ht 69.0 in | Wt 229.4 lb

## 2023-07-06 DIAGNOSIS — I2510 Atherosclerotic heart disease of native coronary artery without angina pectoris: Secondary | ICD-10-CM | POA: Diagnosis not present

## 2023-07-06 NOTE — Progress Notes (Signed)
Cardiology Clinic Note   Patient Name: Tanner Gomez Date of Encounter: 07/06/2023  Primary Care Provider:  Corwin Levins, MD Primary Cardiologist:  Armanda Magic, MD  Patient Profile    57 year old male formally followed by Dr. Carolanne Grumbling and Dr. Rennis Golden for lipid management, with history of hypertension, dyslipidemia, alcohol and tobacco use, depression, history of pancreatitis in the setting of severe hypertriglyceridemia.  Has not been seen in the office since 2022 by Dr. Rennis Golden and in 2016 by Dr. Mayford Knife.  When last seen in the office he was monitored for hypertriglyceridemia by Dr. Rennis Golden and was continued on atorvastatin 80 mg daily, fenofibrate 160 mg Vascepa 1 g (2 capsules daily, omega-3 fatty acids, at that time blood pressure was controlled on amlodipine 10 mg daily, atenolol 100 mg daily.  We still need to reestablish  Past Medical History    Past Medical History:  Diagnosis Date   Depression    Hyperlipidemia    Hypertension    Pancreatitis    Tobacco abuse    Past Surgical History:  Procedure Laterality Date   EYE SURGERY     RIGHT (CHILDHOOD)   JOINT REPLACEMENT      Allergies  Allergies  Allergen Reactions   Sular Swelling    History of Present Illness    Tanner Gomez comes today after not being seen by primary cardiologist since 2016, for ongoing assessment and management of hypertension and hyperlipidemia.  He is being followed by Dr. Rennis Golden for hyperlipidemia but has not been seen since 2022.  Other history includes tobacco abuse and EtOH abuse.  He has been seen by his primary care physician Dr. Oliver Barre and a coronary calcium score was ordered.  This revealed a score of 1758.  The patient was asymptomatic for chest pain.  He is here for follow-up to discuss results and plan further ischemic testing  He is quite anxious about the results of the coronary calcium score.  He has recently been started on Chantix for smoking cessation by his primary care physician.   He unfortunately continues to smoke a pack a day and has been for 40 years.  He drinks a sixpack of beer daily more on the weekends.   He continues to follow-up with Dr. Rennis Golden for lipid management.  He often forgets to take the noontime dose of the Vascepa and is doing his best to remember each day.  He denies any chest pain but does have chronic dyspnea on exertion, chronic fatigue, he denies palpitations, GERD symptoms, syncope or dizziness. Home Medications    Current Outpatient Medications  Medication Sig Dispense Refill   ALPRAZolam (XANAX) 1 MG tablet TAKE 1 TABLET BY MOUTH THREE TIMES A DAY AS NEEDED 90 tablet 2   amLODipine (NORVASC) 10 MG tablet TAKE 1 TABLET BY MOUTH EVERY DAY 30 tablet 8   ascorbic acid (VITAMIN C) 500 MG tablet Take 500 mg by mouth daily.     aspirin EC 81 MG tablet Take 1 tablet (81 mg total) by mouth daily. Swallow whole. 100 tablet 11   atenolol (TENORMIN) 100 MG tablet TAKE 1 TABLET BY MOUTH DAILY. MUST KEEP SCHEDULE APPT W/NEW PROVIDER FOR FUTURE REFILLS 30 tablet 11   atorvastatin (LIPITOR) 80 MG tablet TAKE 1 TABLET BY MOUTH EVERY DAY 30 tablet 8   Cholecalciferol (VITAMIN D) 50 MCG (2000 UT) tablet Take 2,000 Units by mouth daily.     Coenzyme Q10 100 MG capsule Take 100 mg by mouth daily.  cyanocobalamin (VITAMIN B12) 500 MCG tablet Take 500 mcg by mouth daily.     cyclobenzaprine (FLEXERIL) 5 MG tablet Take 1 tablet (5 mg total) by mouth 3 (three) times daily as needed for muscle spasms. 40 tablet 1   fenofibrate 160 MG tablet TAKE 1 TABLET BY MOUTH EVERY DAY 30 tablet 11   icosapent Ethyl (VASCEPA) 1 g capsule TAKE 2 CAPSULES (2 G TOTAL) BY MOUTH 2 (TWO) TIMES DAILY. KEEP FOLLOW UP WITH PROVIDER 120 capsule 0   Magnesium 400 MG TABS Take 400 mg by mouth daily.      NON FORMULARY Take 1 tablet by mouth daily. Full Spectrum Mineral Caps     Omega-3 Fatty Acids (FISH OIL) 1000 MG CAPS Take 4 capsules by mouth daily.     omeprazole (PRILOSEC) 40 MG  capsule TAKE 1 CAPSULE BY MOUTH EVERY DAY 30 capsule 8   PRESCRIPTION MEDICATION Pt uses CPAP Machine     sildenafil (VIAGRA) 100 MG tablet Take 0.5-1 tablets (50-100 mg total) by mouth daily as needed for erectile dysfunction. 5 tablet 11   varenicline (CHANTIX CONTINUING MONTH PAK) 1 MG tablet Take 1 tablet (1 mg total) by mouth 2 (two) times daily. 60 tablet 1   Varenicline Tartrate, Starter, (CHANTIX STARTING MONTH PAK) 0.5 MG X 11 & 1 MG X 42 TBPK Take as directed by mouth daily 53 each 0   predniSONE (DELTASONE) 10 MG tablet 2 tabs by mouth per day for 5 days (Patient not taking: Reported on 07/06/2023) 10 tablet 0   traMADol (ULTRAM) 50 MG tablet Take 1 tablet (50 mg total) by mouth every 6 (six) hours as needed. (Patient not taking: Reported on 07/06/2023) 30 tablet 0   No current facility-administered medications for this visit.     Family History    Family History  Problem Relation Age of Onset   Kidney disease Mother    Hypertension Mother    Heart disease Mother    Heart attack Mother    Diabetes Father    Hypertension Father    Kidney Stones Sister    Heart attack Maternal Grandmother    Heart attack Paternal Grandfather    Colon cancer Neg Hx    He indicated that his mother is deceased. He indicated that his father is deceased. He indicated that his sister is alive. He indicated that his brother is deceased. He indicated that his maternal grandmother is deceased. He indicated that his maternal grandfather is deceased. He indicated that his paternal grandmother is deceased. He indicated that his paternal grandfather is deceased. He indicated that the status of his neg hx is unknown.  Social History    Social History   Socioeconomic History   Marital status: Divorced    Spouse name: Not on file   Number of children: 0   Years of education: 12   Highest education level: Not on file  Occupational History   Occupation: Engineer, materials  Tobacco Use   Smoking  status: Every Day    Current packs/day: 2.00    Average packs/day: 2.0 packs/day for 37.0 years (74.0 ttl pk-yrs)    Types: Cigarettes   Smokeless tobacco: Never  Vaping Use   Vaping status: Never Used  Substance and Sexual Activity   Alcohol use: Yes    Alcohol/week: 33.0 standard drinks of alcohol    Types: 21 Cans of beer, 12 Standard drinks or equivalent per week   Drug use: No   Sexual activity: Yes  Birth control/protection: None    Comment: NUMBER OF SEX PARTNERS IN THE LAST 12 MONTHS 1  Other Topics Concern   Not on file  Social History Narrative   Fun: Patent examiner, play guitar    Social Determinants of Health   Financial Resource Strain: Not on file  Food Insecurity: Not on file  Transportation Needs: Not on file  Physical Activity: Not on file  Stress: Not on file  Social Connections: Not on file  Intimate Partner Violence: Not on file     Review of Systems    General:  No chills, fever, night sweats or weight changes.  Cardiovascular:  No chest pain, dyspnea on exertion, edema, orthopnea, palpitations, paroxysmal nocturnal dyspnea. Dermatological: No rash, lesions/masses Respiratory: No cough, dyspnea Urologic: No hematuria, dysuria Abdominal:   No nausea, vomiting, diarrhea, bright red blood per rectum, melena, or hematemesis Neurologic:  No visual changes, wkns, changes in mental status. All other systems reviewed and are otherwise negative except as noted above.  EKG Interpretation Date/Time:  Friday July 06 2023 08:31:45 EDT Ventricular Rate:  63 PR Interval:  196 QRS Duration:  100 QT Interval:  428 QTC Calculation: 437 R Axis:   3  Text Interpretation: Normal sinus rhythm Minimal voltage criteria for LVH, may be normal variant ( Cornell product ) When compared with ECG of 02-Mar-2011 23:39, No significant change was found Confirmed by Joni Reining [id number]) on 07/06/2023 8:46:51 AM    Physical Exam    VS:  BP 134/70 (BP Location: Right  Arm, Patient Position: Sitting, Cuff Size: Large)   Pulse 63   Ht 5\' 9"  (1.753 m)   Wt 229 lb 6.4 oz (104.1 kg)   SpO2 94%   BMI 33.88 kg/m  , BMI Body mass index is 33.88 kg/m.     GEN: Well nourished, well developed, in no acute distress. HEENT: normal. Neck: Supple, no JVD, soft right carotid bruits, or masses. Cardiac: RRR, no murmurs, rubs, or gallops. No clubbing, cyanosis, edema.  Radials/DP/PT 2+ and equal bilaterally.  Respiratory:  Respirations regular and unlabored, clear to auscultation bilaterally. GI: Soft, nontender, nondistended, BS + x 4. MS: no deformity or atrophy. Skin: warm and dry, no rash. Neuro:  Strength and sensation are intact. Psych: Normal affect.  EKG Interpretation Date/Time:  Friday July 06 2023 08:31:45 EDT Ventricular Rate:  63 PR Interval:  196 QRS Duration:  100 QT Interval:  428 QTC Calculation: 437 R Axis:   3  Text Interpretation: Normal sinus rhythm Minimal voltage criteria for LVH, may be normal variant ( Cornell product ) When compared with ECG of 02-Mar-2011 23:39, No significant change was found Confirmed by Joni Reining [id number]) on 07/06/2023 8:46:51 AM   Lab Results  Component Value Date   WBC 17.8 (H) 06/14/2023   HGB 14.8 06/14/2023   HCT 46.0 06/14/2023   MCV 92.1 06/14/2023   PLT 287.0 06/14/2023   Lab Results  Component Value Date   CREATININE 0.87 06/14/2023   BUN 11 06/14/2023   NA 135 06/14/2023   K 4.0 06/14/2023   CL 100 06/14/2023   CO2 26 06/14/2023   Lab Results  Component Value Date   ALT 37 06/14/2023   AST 50 (H) 06/14/2023   ALKPHOS 76 06/14/2023   BILITOT 0.6 06/14/2023   Lab Results  Component Value Date   CHOL 123 06/14/2023   HDL 23.50 (L) 06/14/2023   LDLCALC 61 05/25/2021   LDLDIRECT 82.0 06/14/2023   TRIG 268.0 (H)  06/14/2023   CHOLHDL 5 06/14/2023    Lab Results  Component Value Date   HGBA1C 6.2 06/14/2023     Review of Prior Studies EKG  Interpretation Date/Time:  Friday July 06 2023 08:31:45 EDT Ventricular Rate:  63 PR Interval:  196 QRS Duration:  100 QT Interval:  428 QTC Calculation: 437 R Axis:   3  Text Interpretation: Normal sinus rhythm Minimal voltage criteria for LVH, may be normal variant ( Cornell product ) When compared with ECG of 02-Mar-2011 23:39, No significant change was found Confirmed by Joni Reining [phone]) on 07/06/2023 8:46:51 AM  Coronary CTA 07/05/2023 IMPRESSION: Coronary calcium score of 1758. This was 29 th percentile for age and sex matched control.   Consider f/u myovue or PET/CT perfusion study given how high calcium score is   Charlton Haws  Assessment & Plan   1.  Abnormal coronary calcium score: Completed on 07/05/2023 with score of 1758, significant calcium in the LAD, less significant in the right coronary artery and left circumflex.  Due to due to CV risk factors smoking, hypercholesterolemia, obesity, and hypertension, I will proceed with a coronary artery CT scan for definitive scoring of calcium burden.  Will get this done hopefully before he follows up with Dr. Rennis Golden on October 4 so discussion could be had concerning the results and further testing, i.e. cardiac catheterization, if warranted.  For now he is to continue aspirin 81 mg daily, lipid management, strongly suggest complete cessation of tobacco, and increased exercise.  2.  Mixed hyperlipidemia: He is being followed by Dr. Rennis Golden for management.  He remains on fenofibrate, atorvastatin 80 mg daily, Vascepa 2 g twice a day.  Primary care provider Dr. Jonny Ruiz has recently done labs on 06/14/2023.  Total cholesterol 123, HDL 23.5, direct LDL 82, triglycerides 268.  4.  Polysubstance abuse: I have discussed smoking cessation and to follow-up with Chantix regimen.  He is aware of withdrawal symptoms of nightmares concerning nicotine abstinence.  I have also discussed with him decreasing EtOH use to assist with weight loss,  triglyceride levels, and avoidance of alcoholic cardiomyopathy.  Tanner Gomez wishes to be followed by Dr. Rennis Golden as his primary cardiologist and not only for lipid management.       Signed, Bettey Mare. Liborio Nixon, ANP, AACC   07/06/2023 9:40 AM      Office [phone] Fax [phone]  Notice: This dictation was prepared with Dragon dictation along with smaller phrase technology. Any transcriptional errors that result from this process are unintentional and may not be corrected upon review. COVID-19 positive test (U07.1, COVID-19) with Acute Respiratory Distress Syndrome (ARDS) (J80, ARDS) (If respiratory failure or sepsis present, add as separate assessment)

## 2023-07-06 NOTE — Patient Instructions (Signed)
Medication Instructions:  No Changes *If you need a refill on your cardiac medications before your next appointment, please call your pharmacy*   Lab Work: No Labs If you have labs (blood work) drawn today and your tests are completely normal, you will receive your results only by: MyChart Message (if you have MyChart) OR A paper copy in the mail If you have any lab test that is abnormal or we need to change your treatment, we will call you to review the results.   Testing/Procedures:   Your cardiac CT will be scheduled at one of the below locations:   [facility] [address] [place], [place] [address] [phone]   If scheduled at [facility], please arrive at the [facility] En[facility] (Entrance C2) of [facility] 30 minutes prior to test start time. You can use the FREE valet parking offered at entrance C (encouraged to control the heart rate for the test)  Proceed to the [facility] Radiology Department (first floor) to check-in and test prep.  All radiology patients and guests should use entrance C2 at [facility], accessed from [facility], even though the hospital's physical address listed is [address].    If scheduled at [facility] or [facility], please arrive 15 mins early for check-in and test prep.  There is spacious parking and easy access to the radiology department from the [facility] Heart and Vascular entrance. Please enter here and check-in with the desk attendant.   Please follow these instructions carefully (unless otherwise directed):  An IV will be required for this test and Nitroglycerin will be given.  Hold all erectile dysfunction medications at least 3 days (72 hrs) prior to test. (Ie viagra, cialis, sildenafil, tadalafil, etc)   On the Night Before the Test: Be sure to Drink plenty of water. Do not consume any  caffeinated/decaffeinated beverages or chocolate 12 hours prior to your test. Do not take any antihistamines 12 hours prior to your test. If the patient has contrast allergy: Patient will need a prescription for Prednisone and very clear instructions (as follows): Prednisone 50 mg - take 13 hours prior to test Take another Prednisone 50 mg 7 hours prior to test Take another Prednisone 50 mg 1 hour prior to test Take Benadryl 50 mg 1 hour prior to test Patient must complete all four doses of above prophylactic medications. Patient will need a ride after test due to Benadryl.  On the Day of the Test: Drink plenty of water until 1 hour prior to the test. Do not eat any food 1 hour prior to test. You may take your regular medications prior to the test.  Take metoprolol (Lopressor) two hours prior to test. If you take Furosemide/Hydrochlorothiazide/Spironolactone, please HOLD on the morning of the test.   After the Test: Drink plenty of water. After receiving IV contrast, you may experience a mild flushed feeling. This is normal. On occasion, you may experience a mild rash up to 24 hours after the test. This is not dangerous. If this occurs, you can take Benadryl 25 mg and increase your fluid intake. If you experience trouble breathing, this can be serious. If it is severe call 911 IMMEDIATELY. If it is mild, please call our office. If you take any of these medications: Glipizide/Metformin, Avandament, Glucavance, please do not take 48 hours after completing test unless otherwise instructed.  We will call to schedule your test 2-4 weeks  out understanding that some insurance companies will need an authorization prior to the service being performed.   For more information and frequently asked questions, please visit our website : [email or web address]  For non-scheduling related questions, please contact the cardiac imaging nurse navigator should you have any  questions/concerns: Cardiac Imaging Nurse Navigators Direct Office Dial: [phone]   For scheduling needs, including cancellations and rescheduling, please call [place], [phone].    Follow-Up: At [facility], you and your health needs are our priority.  As part of our continuing mission to provide you with exceptional heart care, we have created designated Provider Care Teams.  These Care Teams include your primary Cardiologist (physician) and Advanced Practice Providers (APPs -  Physician Assistants and Nurse Practitioners) who all work together to provide you with the care you need, when you need it.  We recommend signing up for the patient portal called "MyChart".  Sign up information is provided on this After Visit Summary.  MyChart is used to connect with patients for Virtual Visits (Telemedicine).  Patients are able to view lab/test results, encounter notes, upcoming appointments, etc.  Non-urgent messages can be sent to your provider as well.   To learn more about what you can do with MyChart, go to [email or web address].    Your next appointment:   Keep Schedule Appointment  Provider:   Zoila Shutter, MD

## 2023-07-16 ENCOUNTER — Encounter (HOSPITAL_COMMUNITY): Payer: Self-pay

## 2023-07-18 ENCOUNTER — Ambulatory Visit (HOSPITAL_COMMUNITY)
Admission: RE | Admit: 2023-07-18 | Discharge: 2023-07-18 | Disposition: A | Discharge status: Home / Self Care | Payer: Commercial Managed Care - PPO | Source: Ambulatory Visit | Attending: Adult Health | Admitting: Adult Health

## 2023-07-18 ENCOUNTER — Other Ambulatory Visit: Payer: Self-pay | Admitting: Cardiology

## 2023-07-18 ENCOUNTER — Ambulatory Visit (HOSPITAL_BASED_OUTPATIENT_CLINIC_OR_DEPARTMENT_OTHER)
Admission: RE | Admit: 2023-07-18 | Discharge: 2023-07-18 | Disposition: A | Discharge status: Home / Self Care | Payer: Commercial Managed Care - PPO | Source: Ambulatory Visit | Attending: Cardiology | Admitting: Cardiology

## 2023-07-18 DIAGNOSIS — I2510 Atherosclerotic heart disease of native coronary artery without angina pectoris: Secondary | ICD-10-CM | POA: Insufficient documentation

## 2023-07-18 DIAGNOSIS — R931 Abnormal findings on diagnostic imaging of heart and coronary circulation: Secondary | ICD-10-CM | POA: Diagnosis not present

## 2023-07-18 MED ORDER — NITROGLYCERIN 0.4 MG SL SUBL
SUBLINGUAL_TABLET | SUBLINGUAL | Status: AC
  Filled 2023-07-18: qty 2

## 2023-07-18 MED ORDER — IOHEXOL 350 MG/ML SOLN
95.0000 mL | Freq: Once | INTRAVENOUS | Status: AC | PRN
  Administered 2023-07-18: 95 mL via INTRAVENOUS

## 2023-07-18 MED ORDER — NITROGLYCERIN 0.4 MG SL SUBL
0.8000 mg | SUBLINGUAL_TABLET | Freq: Once | SUBLINGUAL | Status: AC
  Administered 2023-07-18: 0.8 mg via SUBLINGUAL

## 2023-07-20 ENCOUNTER — Encounter: Payer: Self-pay | Admitting: Internal Medicine

## 2023-07-20 ENCOUNTER — Other Ambulatory Visit: Payer: Self-pay | Admitting: Internal Medicine

## 2023-07-20 ENCOUNTER — Ambulatory Visit: Payer: Commercial Managed Care - PPO | Attending: Internal Medicine | Admitting: Internal Medicine

## 2023-07-20 VITALS — BP 126/88 | HR 62 | Ht 69.0 in | Wt 234.4 lb

## 2023-07-20 DIAGNOSIS — Z01812 Encounter for preprocedural laboratory examination: Secondary | ICD-10-CM | POA: Diagnosis not present

## 2023-07-20 DIAGNOSIS — R931 Abnormal findings on diagnostic imaging of heart and coronary circulation: Secondary | ICD-10-CM

## 2023-07-20 DIAGNOSIS — I2510 Atherosclerotic heart disease of native coronary artery without angina pectoris: Secondary | ICD-10-CM

## 2023-07-20 MED ORDER — ICOSAPENT ETHYL 1 G PO CAPS: 2.0000 g | ORAL_CAPSULE | Freq: Two times a day (BID) | ORAL | 11 refills | Status: DC

## 2023-07-20 NOTE — Patient Instructions (Signed)
Medication Instructions:  VASCEPA has been refilled  STOP over-the-counter fish oil   *If you need a refill on your cardiac medications before your next appointment, please call your pharmacy*   Lab Work: CBC, BMET, LPa today  If you have labs (blood work) drawn today and your tests are completely normal, you will receive your results only by: MyChart Message (if you have MyChart) OR A paper copy in the mail If you have any lab test that is abnormal or we need to change your treatment, we will call you to review the results.   Testing/Procedures: Heart Catheterization at [facility] Monday 07/30/23   Follow-Up: At [facility], you and your health needs are our priority.  As part of our continuing mission to provide you with exceptional heart care, we have created designated Provider Care Teams.  These Care Teams include your primary Cardiologist (physician) and Advanced Practice Providers (APPs -  Physician Assistants and Nurse Practitioners) who all work together to provide you with the care you need, when you need it.  We recommend signing up for the patient portal called "MyChart".  Sign up information is provided on this After Visit Summary.  MyChart is used to connect with patients for Virtual Visits (Telemedicine).  Patients are able to view lab/test results, encounter notes, upcoming appointments, etc.  Non-urgent messages can be sent to your provider as well.   To learn more about what you can do with MyChart, go to [email or web address].    Your next appointment:   Early November with Samara Deist NP  Other Instructions  [facility] A DEPT OF [place]. [facility] AT [facility] [address] [place] [address] [place] [address] Dept: [phone] Loc: [phone]  Tanner Gomez  07/20/2023  You are scheduled for a Cardiac Catheterization on Monday, October 14 with Dr. Peter [place].  1. Please arrive at the [facility] (Main Entrance A) at [facility]: [address] [place], [place] [address] at 10:00 AM (This time is 2 hour(s) before your procedure to ensure your preparation). Free valet parking service is available. You will check in at ADMITTING. The support person will be asked to wait in the waiting room.  It is OK to have someone drop you off and come back when you are ready to be discharged.    Special note: Every effort is made to have your procedure done on time. Please understand that emergencies sometimes delay scheduled procedures.  2. Diet: Do not eat solid foods after midnight.  The patient may have clear liquids until 5am upon the day of the procedure.  3. Labs: You will need to have blood drawn today -- CBC, BMET, LPa  4. Medication instructions in preparation for your procedure:   On the morning of your procedure, take your Aspirin 81 mg and any morning medicines NOT listed above.  You may use sips of water.  5. Plan to go home the same day, you will only stay overnight if medically necessary. 6. Bring a current list of your medications and current insurance cards. 7. You MUST have a responsible person to drive you home. 8. Someone MUST be with you the first 24 hours after you arrive home or your discharge will be delayed. 9. Please wear clothes that are easy to get on and off and wear slip-on shoes.  Thank you for allowing [place] to care for you!   -- [place] [facility]

## 2023-07-20 NOTE — Progress Notes (Signed)
LIPID CLINIC CONSULT NOTE  Chief Complaint:  Follow-up stress test  Primary Care Physician: Corwin Levins, MD  Primary Cardiologist:  Chrystie Nose, MD  HPI:  Tanner Gomez is a 57 y.o. male who is being seen today for the evaluation of hypertriglyceridemia at the request of Corwin Levins, MD.  Tanner Gomez is a pleasant 57 year old male who has been followed by Dr. Mayford Knife with a history of hypertension, dyslipidemia, alcohol and tobacco use, depression and pancreatitis in the setting of severe hypertriglyceridemia.  He is also been followed recently by Margaretmary Dys, PharmD in the [facility] lipid clinic.  He was most recently seen in September 2019, at the time it was recommended that he start Vascepa.  Repeat labs, however recently showed no significant change in his triglycerides and he was referred to me.  His total cholesterol was 163, triglycerides 398, HDL 14 and LDL of 69.  A direct LDL was 94.  He does report compliance with the following lipid medications: Atorvastatin 80 mg daily, fenofibrate 160 mg daily and Niaspan 2000 mg daily.  Unfortunately he reports persistent alcohol use.  He says he drinks 5-6 beers a day and this actually represents a significant reduction in his alcohol intake.  We talked about this at great length and a does not seem that he is interested in necessarily stopping the alcohol but does understand that it is adversely affecting his triglycerides and increasing cardiovascular risk.  He seems to feel that since he can perform at his job and it does not seem to interfere with his lifestyle that it is not abnormal, but I would consider his alcohol use excessive.  The barrier to getting Vascepa was cost and it was greater than $200/month.  This may need to be revisited.  09/01/2019  Tanner Gomez returns today for follow-up of hypertriglyceridemia.  He has had significant improvement in his numbers.  He says he is been able to make significant dietary changes.  He is  cut back on fast foods and eating more healthy meals.  He is more active at work walking around the [employer] on a daily basis.  He also is working to try to start an exercise program at [employer].  He is triglycerides most recently were down to 248 (improved from 422 in the past).  He has had no further episodes of pancreatitis.  He is also reduced alcohol intake significantly.  His most recent lipid profile showed total cholesterol 154, triglycerides 248, HDL 20 LDL 92.  He is also on high potency statin and fenofibrate 160 mg daily.  05/05/2020  Tanner Gomez is seen today for follow-up.  He is triglycerides have gone up over the past 6 months.  Currently they are 304 from 203.  LDL cholesterol is 89 with total 160 and HDL is come down from 32-19.  Some of this may be related to dietary changes which she is noted notably made in the last year including more biscuits and other fast food items.  In addition he was diagnosed with low vitamin D levels which could account for his low HDL.  He was given a 50,000 units supplementation and has not yet started on maintenance dosing which I recommended at least 2000 or 3000 units daily.  05/27/2021  Tanner Gomez is seen today in follow-up.  His numbers continue to improve.  Total cholesterol now 127, triglycerides 199, down from 285.  HDL 26 and LDL is now 40, down from  57.  He is compliant with his medications.  He has made significant lifestyle changes.  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  He has reduced alcohol intake.  Overall he is doing very well.  Blood pressure is good today.  I have repleted his vitamin D which was within normal limits back in March.  He has follow-up with his primary care provider in February 2023.  07/20/2023  Tanner Gomez is seen today in follow-up.  He recently was seen by Joni Reining, DNP for workup of abnormal coronary calcifications.  I last saw him in 2022 for lipid management however recently he  had a screening CT scan due to 40 years of smoking history and he was noted to have left main and three-vessel coronary artery disease including aortic atherosclerosis on high-resolution CT.  Subsequently he underwent calcium scoring which showed a calcium score of 1758, 99th percentile.  He was then seen in follow-up and a coronary CT was ordered.  Despite all this he has denied any chest pain or shortness of breath or worsening fatigue or exercise intolerance.  The coronary CT demonstrated occlusion of the proximal RCA with distal collaterals and moderate to severe LAD and circumflex disease.  FFR was sent and suggest abnormality with flow limitation of the proximal to mid LAD as well as the right coronary which was believed to be occluded.  Based on these findings cardiac catheterization was recommended.  He returns today for follow-up.  He is quite anxious about these findings.  He still continues to deny any symptoms of chest pain or shortness of breath.  PMHx:  Past Medical History:  Diagnosis Date   Depression    Hyperlipidemia    Hypertension    Pancreatitis    Tobacco abuse     Past Surgical History:  Procedure Laterality Date   EYE SURGERY     RIGHT (CHILDHOOD)   JOINT REPLACEMENT      FAMHx:  Family History  Problem Relation Age of Onset   Kidney disease Mother    Hypertension Mother    Heart disease Mother    Heart attack Mother    Diabetes Father    Hypertension Father    Kidney Stones Sister    Heart attack Maternal Grandmother    Heart attack Paternal Grandfather    Colon cancer Neg Hx     SOCHx:   reports that he has been smoking cigarettes. He has a 74 pack-year smoking history. He has never used smokeless tobacco. He reports current alcohol use of about 33.0 standard drinks of alcohol per week. He reports that he does not use drugs.  ALLERGIES:  Allergies  Allergen Reactions   Sular Swelling    ROS: Pertinent items noted in HPI and remainder of  comprehensive ROS otherwise negative.  HOME MEDS: Current Outpatient Medications on File Prior to Visit  Medication Sig Dispense Refill   ALPRAZolam (XANAX) 1 MG tablet TAKE 1 TABLET BY MOUTH THREE TIMES A DAY AS NEEDED 90 tablet 2   amLODipine (NORVASC) 10 MG tablet TAKE 1 TABLET BY MOUTH EVERY DAY 30 tablet 8   ascorbic acid (VITAMIN C) 500 MG tablet Take 500 mg by mouth daily.     aspirin EC 81 MG tablet Take 1 tablet (81 mg total) by mouth daily. Swallow whole. 100 tablet 11   atenolol (TENORMIN) 100 MG tablet TAKE 1 TABLET BY MOUTH DAILY. MUST KEEP SCHEDULE APPT W/NEW PROVIDER FOR FUTURE REFILLS 30 tablet  11   atorvastatin (LIPITOR) 80 MG tablet TAKE 1 TABLET BY MOUTH EVERY DAY 30 tablet 8   Cholecalciferol (VITAMIN D) 50 MCG (2000 UT) tablet Take 2,000 Units by mouth daily.     Coenzyme Q10 100 MG capsule Take 100 mg by mouth daily.      cyanocobalamin (VITAMIN B12) 500 MCG tablet Take 500 mcg by mouth daily.     fenofibrate 160 MG tablet TAKE 1 TABLET BY MOUTH EVERY DAY 30 tablet 11   Magnesium 400 MG TABS Take 400 mg by mouth daily.      NON FORMULARY Take 1 tablet by mouth daily. Full Spectrum Mineral Caps     omeprazole (PRILOSEC) 40 MG capsule TAKE 1 CAPSULE BY MOUTH EVERY DAY 30 capsule 8   PRESCRIPTION MEDICATION Pt uses CPAP Machine     sildenafil (VIAGRA) 100 MG tablet Take 0.5-1 tablets (50-100 mg total) by mouth daily as needed for erectile dysfunction. 5 tablet 11   varenicline (CHANTIX CONTINUING MONTH PAK) 1 MG tablet Take 1 tablet (1 mg total) by mouth 2 (two) times daily. 60 tablet 1   Varenicline Tartrate, Starter, (CHANTIX STARTING MONTH PAK) 0.5 MG X 11 & 1 MG X 42 TBPK Take as directed by mouth daily 53 each 0   cyclobenzaprine (FLEXERIL) 5 MG tablet Take 1 tablet (5 mg total) by mouth 3 (three) times daily as needed for muscle spasms. (Patient not taking: Reported on 07/20/2023) 40 tablet 1   predniSONE (DELTASONE) 10 MG tablet 2 tabs by mouth per day for 5 days  (Patient not taking: Reported on 07/06/2023) 10 tablet 0   traMADol (ULTRAM) 50 MG tablet Take 1 tablet (50 mg total) by mouth every 6 (six) hours as needed. (Patient not taking: Reported on 07/06/2023) 30 tablet 0   No current facility-administered medications on file prior to visit.    LABS/IMAGING: No results found for this or any previous visit (from the past 48 hour(s)). No results found.  LIPID PANEL:    Component Value Date/Time   CHOL 123 06/14/2023 0932   CHOL 160 04/28/2020 1019   TRIG 268.0 (H) 06/14/2023 0932   HDL 23.50 (L) 06/14/2023 0932   HDL 19 (L) 04/28/2020 1019   CHOLHDL 5 06/14/2023 0932   VLDL 53.6 (H) 06/14/2023 0932   LDLCALC 61 05/25/2021 0834   LDLCALC 90 04/28/2020 1019   LDLDIRECT 82.0 06/14/2023 0932    WEIGHTS: Wt Readings from Last 3 Encounters:  07/20/23 234 lb 6.4 oz (106.3 kg)  07/06/23 229 lb 6.4 oz (104.1 kg)  06/14/23 236 lb (107 kg)    VITALS: BP 126/88   Pulse 62   Ht 5\' 9"  (1.753 m)   Wt 234 lb 6.4 oz (106.3 kg)   SpO2 94%   BMI 34.61 kg/m   EXAM: Deferred  EKG: Deferred  ASSESSMENT: Multivessel coronary artery calcium with high CAC score and two-vessel CAD with CTO of the RCA and proximal to mid LAD flow restriction noted by CT FFR (07/2023) Primary hypertriglyceridemia History of pancreatitis secondary to #1 and alcohol use Alcohol dependence  Vitamin D deficiency 40-pack-year smoking history  PLAN: 1.   Tanner Gomez was recently found to have severe multivessel coronary calcification on lung cancer screening CT.  Calcium scoring was elevated and subsequently he had a coronary CT which again showed high calcium scoring and probable two-vessel coronary disease.  Despite this he is denying any symptoms of chest pain or shortness of breath.  The right coronary  artery total occlusion may be old and it is possible that the severity of the LAD stenosis is overestimated.  Nonetheless he is quite anxious about his symptoms and I  think cardiac catheterization is indicated.  He is already on a statin, aspirin and other therapies as would be recommended for coronary disease.  We discussed the possibility that he may have significant obstruction in which case I suspect he would undergo stenting.  It is possible his symptoms may be minimized or he may be somewhat used to them if this is a chronic finding.  However, if his arteries are not a significantly blocked then this may provide him some peace of mind.  Informed Consent   Shared Decision Making/Informed Consent The risks [stroke (1 in 1000), death (1 in 1000), kidney failure [usually temporary] (1 in 500), bleeding (1 in 200), allergic reaction [possibly serious] (1 in 200)], benefits (diagnostic support and management of coronary artery disease) and alternatives of a cardiac catheterization were discussed in detail with Tanner Gomez and he is willing to proceed.    Chrystie Nose, MD, [facility], FACP  [place]  [facility]  Medical Director of the Advanced Lipid Disorders &  Cardiovascular Risk Reduction Clinic Diplomate of the American Board of Clinical Lipidology Attending Cardiologist  Direct Dial: [phone]  Fax: [phone]  Website:  [email or web address] [email or web address] 07/20/2023, 4:31 PM

## 2023-07-20 NOTE — H&P (View-Only) (Signed)
LIPID CLINIC CONSULT NOTE  Chief Complaint:  Follow-up stress test  Primary Care Physician: Corwin Levins, MD  Primary Cardiologist:  Chrystie Nose, MD  HPI:  Tanner Gomez is a 57 y.o. male who is being seen today for the evaluation of hypertriglyceridemia at the request of Corwin Levins, MD.  Tanner Gomez is a pleasant 57 year old male who has been followed by Dr. Mayford Knife with a history of hypertension, dyslipidemia, alcohol and tobacco use, depression and pancreatitis in the setting of severe hypertriglyceridemia.  He is also been followed recently by Margaretmary Dys, PharmD in the [facility] lipid clinic.  He was most recently seen in September 2019, at the time it was recommended that he start Vascepa.  Repeat labs, however recently showed no significant change in his triglycerides and he was referred to me.  His total cholesterol was 163, triglycerides 398, HDL 14 and LDL of 69.  A direct LDL was 94.  He does report compliance with the following lipid medications: Atorvastatin 80 mg daily, fenofibrate 160 mg daily and Niaspan 2000 mg daily.  Unfortunately he reports persistent alcohol use.  He says he drinks 5-6 beers a day and this actually represents a significant reduction in his alcohol intake.  We talked about this at great length and a does not seem that he is interested in necessarily stopping the alcohol but does understand that it is adversely affecting his triglycerides and increasing cardiovascular risk.  He seems to feel that since he can perform at his job and it does not seem to interfere with his lifestyle that it is not abnormal, but I would consider his alcohol use excessive.  The barrier to getting Vascepa was cost and it was greater than $200/month.  This may need to be revisited.  09/01/2019  Tanner Gomez returns today for follow-up of hypertriglyceridemia.  He has had significant improvement in his numbers.  He says he is been able to make significant dietary changes.  He is  cut back on fast foods and eating more healthy meals.  He is more active at work walking around the [employer] on a daily basis.  He also is working to try to start an exercise program at [employer].  He is triglycerides most recently were down to 248 (improved from 422 in the past).  He has had no further episodes of pancreatitis.  He is also reduced alcohol intake significantly.  His most recent lipid profile showed total cholesterol 154, triglycerides 248, HDL 20 LDL 92.  He is also on high potency statin and fenofibrate 160 mg daily.  05/05/2020  Tanner Gomez is seen today for follow-up.  He is triglycerides have gone up over the past 6 months.  Currently they are 304 from 203.  LDL cholesterol is 89 with total 160 and HDL is come down from 32-19.  Some of this may be related to dietary changes which she is noted notably made in the last year including more biscuits and other fast food items.  In addition he was diagnosed with low vitamin D levels which could account for his low HDL.  He was given a 50,000 units supplementation and has not yet started on maintenance dosing which I recommended at least 2000 or 3000 units daily.  05/27/2021  Tanner Gomez is seen today in follow-up.  His numbers continue to improve.  Total cholesterol now 127, triglycerides 199, down from 285.  HDL 26 and LDL is now 40, down from  57.  He is compliant with his medications.  He has made significant lifestyle changes.  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  He has reduced alcohol intake.  Overall he is doing very well.  Blood pressure is good today.  I have repleted his vitamin D which was within normal limits back in March.  He has follow-up with his primary care provider in February 2023.  07/20/2023  Tanner Gomez is seen today in follow-up.  He recently was seen by Joni Reining, DNP for workup of abnormal coronary calcifications.  I last saw him in 2022 for lipid management however recently he  had a screening CT scan due to 40 years of smoking history and he was noted to have left main and three-vessel coronary artery disease including aortic atherosclerosis on high-resolution CT.  Subsequently he underwent calcium scoring which showed a calcium score of 1758, 99th percentile.  He was then seen in follow-up and a coronary CT was ordered.  Despite all this he has denied any chest pain or shortness of breath or worsening fatigue or exercise intolerance.  The coronary CT demonstrated occlusion of the proximal RCA with distal collaterals and moderate to severe LAD and circumflex disease.  FFR was sent and suggest abnormality with flow limitation of the proximal to mid LAD as well as the right coronary which was believed to be occluded.  Based on these findings cardiac catheterization was recommended.  He returns today for follow-up.  He is quite anxious about these findings.  He still continues to deny any symptoms of chest pain or shortness of breath.  PMHx:  Past Medical History:  Diagnosis Date   Depression    Hyperlipidemia    Hypertension    Pancreatitis    Tobacco abuse     Past Surgical History:  Procedure Laterality Date   EYE SURGERY     RIGHT (CHILDHOOD)   JOINT REPLACEMENT      FAMHx:  Family History  Problem Relation Age of Onset   Kidney disease Mother    Hypertension Mother    Heart disease Mother    Heart attack Mother    Diabetes Father    Hypertension Father    Kidney Stones Sister    Heart attack Maternal Grandmother    Heart attack Paternal Grandfather    Colon cancer Neg Hx     SOCHx:   reports that he has been smoking cigarettes. He has a 74 pack-year smoking history. He has never used smokeless tobacco. He reports current alcohol use of about 33.0 standard drinks of alcohol per week. He reports that he does not use drugs.  ALLERGIES:  Allergies  Allergen Reactions   Sular Swelling    ROS: Pertinent items noted in HPI and remainder of  comprehensive ROS otherwise negative.  HOME MEDS: Current Outpatient Medications on File Prior to Visit  Medication Sig Dispense Refill   ALPRAZolam (XANAX) 1 MG tablet TAKE 1 TABLET BY MOUTH THREE TIMES A DAY AS NEEDED 90 tablet 2   amLODipine (NORVASC) 10 MG tablet TAKE 1 TABLET BY MOUTH EVERY DAY 30 tablet 8   ascorbic acid (VITAMIN C) 500 MG tablet Take 500 mg by mouth daily.     aspirin EC 81 MG tablet Take 1 tablet (81 mg total) by mouth daily. Swallow whole. 100 tablet 11   atenolol (TENORMIN) 100 MG tablet TAKE 1 TABLET BY MOUTH DAILY. MUST KEEP SCHEDULE APPT W/NEW PROVIDER FOR FUTURE REFILLS 30 tablet  11   atorvastatin (LIPITOR) 80 MG tablet TAKE 1 TABLET BY MOUTH EVERY DAY 30 tablet 8   Cholecalciferol (VITAMIN D) 50 MCG (2000 UT) tablet Take 2,000 Units by mouth daily.     Coenzyme Q10 100 MG capsule Take 100 mg by mouth daily.      cyanocobalamin (VITAMIN B12) 500 MCG tablet Take 500 mcg by mouth daily.     fenofibrate 160 MG tablet TAKE 1 TABLET BY MOUTH EVERY DAY 30 tablet 11   Magnesium 400 MG TABS Take 400 mg by mouth daily.      NON FORMULARY Take 1 tablet by mouth daily. Full Spectrum Mineral Caps     omeprazole (PRILOSEC) 40 MG capsule TAKE 1 CAPSULE BY MOUTH EVERY DAY 30 capsule 8   PRESCRIPTION MEDICATION Pt uses CPAP Machine     sildenafil (VIAGRA) 100 MG tablet Take 0.5-1 tablets (50-100 mg total) by mouth daily as needed for erectile dysfunction. 5 tablet 11   varenicline (CHANTIX CONTINUING MONTH PAK) 1 MG tablet Take 1 tablet (1 mg total) by mouth 2 (two) times daily. 60 tablet 1   Varenicline Tartrate, Starter, (CHANTIX STARTING MONTH PAK) 0.5 MG X 11 & 1 MG X 42 TBPK Take as directed by mouth daily 53 each 0   cyclobenzaprine (FLEXERIL) 5 MG tablet Take 1 tablet (5 mg total) by mouth 3 (three) times daily as needed for muscle spasms. (Patient not taking: Reported on 07/20/2023) 40 tablet 1   predniSONE (DELTASONE) 10 MG tablet 2 tabs by mouth per day for 5 days  (Patient not taking: Reported on 07/06/2023) 10 tablet 0   traMADol (ULTRAM) 50 MG tablet Take 1 tablet (50 mg total) by mouth every 6 (six) hours as needed. (Patient not taking: Reported on 07/06/2023) 30 tablet 0   No current facility-administered medications on file prior to visit.    LABS/IMAGING: No results found for this or any previous visit (from the past 48 hour(s)). No results found.  LIPID PANEL:    Component Value Date/Time   CHOL 123 06/14/2023 0932   CHOL 160 04/28/2020 1019   TRIG 268.0 (H) 06/14/2023 0932   HDL 23.50 (L) 06/14/2023 0932   HDL 19 (L) 04/28/2020 1019   CHOLHDL 5 06/14/2023 0932   VLDL 53.6 (H) 06/14/2023 0932   LDLCALC 61 05/25/2021 0834   LDLCALC 90 04/28/2020 1019   LDLDIRECT 82.0 06/14/2023 0932    WEIGHTS: Wt Readings from Last 3 Encounters:  07/20/23 234 lb 6.4 oz (106.3 kg)  07/06/23 229 lb 6.4 oz (104.1 kg)  06/14/23 236 lb (107 kg)    VITALS: BP 126/88   Pulse 62   Ht 5\' 9"  (1.753 m)   Wt 234 lb 6.4 oz (106.3 kg)   SpO2 94%   BMI 34.61 kg/m   EXAM: Deferred  EKG: Deferred  ASSESSMENT: Multivessel coronary artery calcium with high CAC score and two-vessel CAD with CTO of the RCA and proximal to mid LAD flow restriction noted by CT FFR (07/2023) Primary hypertriglyceridemia History of pancreatitis secondary to #1 and alcohol use Alcohol dependence  Vitamin D deficiency 40-pack-year smoking history  PLAN: 1.   Tanner Gomez was recently found to have severe multivessel coronary calcification on lung cancer screening CT.  Calcium scoring was elevated and subsequently he had a coronary CT which again showed high calcium scoring and probable two-vessel coronary disease.  Despite this he is denying any symptoms of chest pain or shortness of breath.  The right coronary  artery total occlusion may be old and it is possible that the severity of the LAD stenosis is overestimated.  Nonetheless he is quite anxious about his symptoms and I  think cardiac catheterization is indicated.  He is already on a statin, aspirin and other therapies as would be recommended for coronary disease.  We discussed the possibility that he may have significant obstruction in which case I suspect he would undergo stenting.  It is possible his symptoms may be minimized or he may be somewhat used to them if this is a chronic finding.  However, if his arteries are not a significantly blocked then this may provide him some peace of mind.  Informed Consent   Shared Decision Making/Informed Consent The risks [stroke (1 in 1000), death (1 in 1000), kidney failure [usually temporary] (1 in 500), bleeding (1 in 200), allergic reaction [possibly serious] (1 in 200)], benefits (diagnostic support and management of coronary artery disease) and alternatives of a cardiac catheterization were discussed in detail with Tanner Gomez and he is willing to proceed.    Chrystie Nose, MD, [facility], FACP  [place]  [facility]  Medical Director of the Advanced Lipid Disorders &  Cardiovascular Risk Reduction Clinic Diplomate of the American Board of Clinical Lipidology Attending Cardiologist  Direct Dial: [phone]  Fax: [phone]  Website:  [email or web address] 07/20/2023, 4:31 PM

## 2023-07-21 LAB — CBC
Hematocrit: 47.1 % (ref 37.5–51.0)
Hemoglobin: 15.1 g/dL (ref 13.0–17.7)
MCH: 30.1 pg (ref 26.6–33.0)
MCHC: 32.1 g/dL (ref 31.5–35.7)
MCV: 94 fL (ref 79–97)
Platelets: 293 10*3/uL (ref 150–450)
RBC: 5.02 x10E6/uL (ref 4.14–5.80)
RDW: 12.6 % (ref 11.6–15.4)
WBC: 16.9 10*3/uL — ABNORMAL HIGH (ref 3.4–10.8)

## 2023-07-21 LAB — BASIC METABOLIC PANEL
BUN/Creatinine Ratio: 12 (ref 9–20)
BUN: 11 mg/dL (ref 6–24)
CO2: 21 mmol/L (ref 20–29)
Calcium: 9.8 mg/dL (ref 8.7–10.2)
Chloride: 98 mmol/L (ref 96–106)
Creatinine, Ser: 0.9 mg/dL (ref 0.76–1.27)
Glucose: 90 mg/dL (ref 70–99)
Potassium: 4 mmol/L (ref 3.5–5.2)
Sodium: 137 mmol/L (ref 134–144)
eGFR: 100 mL/min/{1.73_m2} (ref >59)

## 2023-07-21 LAB — LIPOPROTEIN A (LPA): Lipoprotein (a): <8.4 nmol/L (ref <75.0)

## 2023-07-23 ENCOUNTER — Other Ambulatory Visit: Payer: Self-pay | Admitting: Internal Medicine

## 2023-07-23 DIAGNOSIS — R931 Abnormal findings on diagnostic imaging of heart and coronary circulation: Secondary | ICD-10-CM

## 2023-07-26 ENCOUNTER — Telehealth: Payer: Self-pay | Admitting: *Deleted

## 2023-07-26 NOTE — Telephone Encounter (Signed)
Cardiac Catheterization scheduled at [facility] for: Monday July 30, 2023 12 Noon Arrival time [facility] Main Entrance A at: 10 AM  Nothing to eat after midnight prior to procedure, clear liquids until 5 AM day of procedure.  Medication instructions: -Usual morning medications can be taken with sips of water including aspirin 81 mg.  Plan to go home the same day, you will only stay overnight if medically necessary.  You must have responsible adult to drive you home.  Someone must be with you the first 24 hours after you arrive home.  Reviewed procedure instructions with patient.

## 2023-07-29 ENCOUNTER — Other Ambulatory Visit: Payer: Self-pay | Admitting: Internal Medicine

## 2023-07-29 DIAGNOSIS — I10 Essential (primary) hypertension: Secondary | ICD-10-CM

## 2023-07-29 DIAGNOSIS — K219 Gastro-esophageal reflux disease without esophagitis: Secondary | ICD-10-CM

## 2023-07-30 ENCOUNTER — Other Ambulatory Visit: Payer: Self-pay

## 2023-07-30 ENCOUNTER — Encounter (HOSPITAL_COMMUNITY)
Admission: RE | Disposition: A | Discharge status: Home / Self Care | Payer: Self-pay | Source: Home / Self Care | Attending: Cardiology

## 2023-07-30 ENCOUNTER — Ambulatory Visit (HOSPITAL_COMMUNITY)
Admission: RE | Admit: 2023-07-30 | Discharge: 2023-07-30 | Disposition: A | Discharge status: Home / Self Care | Payer: Commercial Managed Care - PPO | Attending: Cardiology | Admitting: Cardiology

## 2023-07-30 DIAGNOSIS — F1090 Alcohol use, unspecified, uncomplicated: Secondary | ICD-10-CM | POA: Insufficient documentation

## 2023-07-30 DIAGNOSIS — I25118 Atherosclerotic heart disease of native coronary artery with other forms of angina pectoris: Secondary | ICD-10-CM | POA: Diagnosis not present

## 2023-07-30 DIAGNOSIS — Z79899 Other long term (current) drug therapy: Secondary | ICD-10-CM | POA: Diagnosis not present

## 2023-07-30 DIAGNOSIS — E781 Pure hyperglyceridemia: Secondary | ICD-10-CM | POA: Insufficient documentation

## 2023-07-30 DIAGNOSIS — Z7982 Long term (current) use of aspirin: Secondary | ICD-10-CM | POA: Insufficient documentation

## 2023-07-30 DIAGNOSIS — I2584 Coronary atherosclerosis due to calcified coronary lesion: Secondary | ICD-10-CM | POA: Diagnosis not present

## 2023-07-30 DIAGNOSIS — E559 Vitamin D deficiency, unspecified: Secondary | ICD-10-CM | POA: Diagnosis not present

## 2023-07-30 DIAGNOSIS — I2510 Atherosclerotic heart disease of native coronary artery without angina pectoris: Secondary | ICD-10-CM

## 2023-07-30 DIAGNOSIS — F32A Depression, unspecified: Secondary | ICD-10-CM | POA: Diagnosis not present

## 2023-07-30 DIAGNOSIS — F17210 Nicotine dependence, cigarettes, uncomplicated: Secondary | ICD-10-CM | POA: Insufficient documentation

## 2023-07-30 DIAGNOSIS — I2582 Chronic total occlusion of coronary artery: Secondary | ICD-10-CM | POA: Diagnosis not present

## 2023-07-30 DIAGNOSIS — I10 Essential (primary) hypertension: Secondary | ICD-10-CM | POA: Insufficient documentation

## 2023-07-30 DIAGNOSIS — R931 Abnormal findings on diagnostic imaging of heart and coronary circulation: Secondary | ICD-10-CM

## 2023-07-30 HISTORY — PX: LEFT HEART CATH AND CORONARY ANGIOGRAPHY: CATH118249

## 2023-07-30 LAB — CBC
HCT: 45.8 % (ref 39.0–52.0)
Hemoglobin: 15.1 g/dL (ref 13.0–17.0)
MCH: 30 pg (ref 26.0–34.0)
MCHC: 33 g/dL (ref 30.0–36.0)
MCV: 90.9 fL (ref 80.0–100.0)
Platelets: 278 10*3/uL (ref 150–400)
RBC: 5.04 MIL/uL (ref 4.22–5.81)
RDW: 13.9 % (ref 11.5–15.5)
WBC: 16.6 10*3/uL — ABNORMAL HIGH (ref 4.0–10.5)
nRBC: 0 % (ref 0.0–0.2)

## 2023-07-30 SURGERY — LEFT HEART CATH AND CORONARY ANGIOGRAPHY
Anesthesia: LOCAL

## 2023-07-30 MED ORDER — FENTANYL CITRATE (PF) 100 MCG/2ML IJ SOLN
INTRAMUSCULAR | Status: DC | PRN
  Administered 2023-07-30: 25 ug via INTRAVENOUS

## 2023-07-30 MED ORDER — FENTANYL CITRATE (PF) 100 MCG/2ML IJ SOLN
INTRAMUSCULAR | Status: AC
  Filled 2023-07-30: qty 2

## 2023-07-30 MED ORDER — VERAPAMIL HCL 2.5 MG/ML IV SOLN
INTRAVENOUS | Status: AC
  Filled 2023-07-30: qty 2

## 2023-07-30 MED ORDER — HEPARIN SODIUM (PORCINE) 1000 UNIT/ML IJ SOLN
INTRAMUSCULAR | Status: DC | PRN
  Administered 2023-07-30: 5000 [IU] via INTRAVENOUS

## 2023-07-30 MED ORDER — HEPARIN (PORCINE) IN NACL 1000-0.9 UT/500ML-% IV SOLN
INTRAVENOUS | Status: DC | PRN
  Administered 2023-07-30 (×2): 500 mL via INTRA_ARTERIAL

## 2023-07-30 MED ORDER — LIDOCAINE HCL (PF) 1 % IJ SOLN
INTRAMUSCULAR | Status: AC
  Filled 2023-07-30: qty 30

## 2023-07-30 MED ORDER — LIDOCAINE HCL (PF) 1 % IJ SOLN
INTRAMUSCULAR | Status: DC | PRN
  Administered 2023-07-30: 2 mL

## 2023-07-30 MED ORDER — VERAPAMIL HCL 2.5 MG/ML IV SOLN
INTRAVENOUS | Status: DC | PRN
  Administered 2023-07-30: 10 mL via INTRA_ARTERIAL

## 2023-07-30 MED ORDER — MIDAZOLAM HCL 2 MG/2ML IJ SOLN
INTRAMUSCULAR | Status: DC | PRN
  Administered 2023-07-30: 2 mg via INTRAVENOUS

## 2023-07-30 MED ORDER — SODIUM CHLORIDE 0.9% FLUSH: 3.0000 mL | INTRAVENOUS | Status: DC | PRN

## 2023-07-30 MED ORDER — SODIUM CHLORIDE 0.9 % WEIGHT BASED INFUSION: 1.0000 mL/kg/h | INTRAVENOUS | Status: DC

## 2023-07-30 MED ORDER — MIDAZOLAM HCL 2 MG/2ML IJ SOLN
INTRAMUSCULAR | Status: AC
  Filled 2023-07-30: qty 2

## 2023-07-30 MED ORDER — IOHEXOL 350 MG/ML SOLN
INTRAVENOUS | Status: DC | PRN
  Administered 2023-07-30: 50 mL via INTRA_ARTERIAL

## 2023-07-30 MED ORDER — SODIUM CHLORIDE 0.9 % WEIGHT BASED INFUSION
3.0000 mL/kg/h | INTRAVENOUS | Status: AC
  Administered 2023-07-30: 3 mL/kg/h via INTRAVENOUS

## 2023-07-30 MED ORDER — HEPARIN SODIUM (PORCINE) 1000 UNIT/ML IJ SOLN
INTRAMUSCULAR | Status: AC
  Filled 2023-07-30: qty 10

## 2023-07-30 MED ORDER — ASPIRIN 81 MG PO CHEW: 81.0000 mg | CHEWABLE_TABLET | ORAL | Status: DC

## 2023-07-30 SURGICAL SUPPLY — 9 items
CATH 5FR JL3.5 JR4 ANG PIG MP (CATHETERS) IMPLANT
DEVICE RAD COMP TR BAND LRG (VASCULAR PRODUCTS) IMPLANT
GLIDESHEATH SLEND SS 6F .021 (SHEATH) IMPLANT
GUIDEWIRE INQWIRE 1.5J.035X260 (WIRE) IMPLANT
INQWIRE 1.5J .035X260CM (WIRE) ×1
PACK CARDIAC CATHETERIZATION (CUSTOM PROCEDURE TRAY) ×2 IMPLANT
SET ATX-X65L (MISCELLANEOUS) IMPLANT
SHEATH PROBE COVER 6X72 (BAG) IMPLANT
WIRE HI TORQ VERSACORE-J 145CM (WIRE) IMPLANT

## 2023-07-30 NOTE — Progress Notes (Addendum)
TR band removed at 1520, right radial level 0, clean, dry and intact, gauze dressing applied.  Patient walked to the bathroom without difficulties.

## 2023-07-30 NOTE — Discharge Instructions (Signed)

## 2023-07-30 NOTE — Interval H&P Note (Signed)
History and Physical Interval Note:  07/30/2023 12:43 PM  Tanner Gomez  has presented today for surgery, with the diagnosis of coronary artery disease and abnormal coronary cta.  The various methods of treatment have been discussed with the patient and family. After consideration of risks, benefits and other options for treatment, the patient has consented to  Procedure(s): LEFT HEART CATH AND CORONARY ANGIOGRAPHY (N/A) as a surgical intervention.  The patient's history has been reviewed, patient examined, no change in status, stable for surgery.  I have reviewed the patient's chart and labs.  Questions were answered to the patient's satisfaction.   Cath Lab Visit (complete for each Cath Lab visit)  Clinical Evaluation Leading to the Procedure:   ACS: No.  Non-ACS:    Anginal Classification: CCS I  Anti-ischemic medical therapy: Maximal Therapy (2 or more classes of medications)  Non-Invasive Test Results: High-risk stress test findings: cardiac mortality >3%/year  Prior CABG: No previous CABG        Theron [facility] 07/30/2023 12:43 PM

## 2023-07-31 ENCOUNTER — Encounter (HOSPITAL_COMMUNITY): Payer: Self-pay | Admitting: Cardiology

## 2023-08-20 NOTE — Progress Notes (Unsigned)
Cardiology Office Note:  .   Date:  08/23/2023  ID:  Tanner Gomez, DOB 13-Jun-1966, MRN [id number] PCP: Tanner Levins, MD  [facility] Providers Cardiologist:  Tanner Nose, MD   History of Present Illness: .   Tanner Gomez is a 57 y.o. male with history of hypertension, dyslipidemia, alcohol and tobacco use, depression, history of pancreatitis in the setting of severe hypertriglyceridemia. Was sent for cardiac cath, with score of 1758, significant calcium in the LAD, less significant in the right coronary artery and left circumflex.  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   He comes today without any new cardiac complaints.  He is working on quitting smoking.  He is down to about 1 to 2 cigarettes a day and often puts them out due to taste and feeling lightheaded.  He is about ready to have full cessation.  He has been medically compliant and is been followed by PCP for labs.  He is due for 97-month follow-up of labs with addition of Vascepa.  ROS: As above otherwise negative.  Studies Reviewed: Marland Kitchen   EKG Interpretation Date/Time:  Thursday August 23 2023 14:10:08 EST Ventricular Rate:  64 PR Interval:  198 QRS Duration:  96 QT Interval:  432 QTC Calculation: 445 R Axis:   4  Text Interpretation: Normal sinus rhythm Normal ECG When compared with ECG of 06-Jul-2023 08:31, No significant change was found Confirmed by Tanner Gomez [id number] on 08/23/2023 3:20:09 PM    [facility] Cardiac Cath 07/30/2023  Ost RCA to Prox RCA lesion is 90% stenosed.   Prox RCA to Mid RCA lesion is 100% stenosed.   Prox LAD lesion is 35% stenosed.   Mid LAD lesion is 55% stenosed.   The left ventricular systolic function is normal.   LV end diastolic pressure is mildly elevated.   The left ventricular ejection fraction is 55-65% by visual estimate.   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  with excellent left to right collaterals Normal LV function Mildly elevated LVEDP 19 mmHg   Plan: recommend medical management and risk factor modification    Physical Exam:   VS:  BP 122/80 (BP Location: Left Arm, Patient Position: Sitting, Cuff Size: Normal)   Pulse 64   Resp 16   Ht 5\' 9"  (1.753 m)   Wt 235 lb 9.6 oz (106.9 kg)   SpO2 95%   BMI 34.79 kg/m    Wt Readings from Last 3 Encounters:  08/23/23 235 lb 9.6 oz (106.9 kg)  07/30/23 236 lb (107 kg)  07/20/23 234 lb 6.4 oz (106.3 kg)    GEN: Well nourished, well developed in no acute distress NECK: No JVD; No carotid bruits CARDIAC: RRR, no murmurs, rubs, gallops RESPIRATORY:  Clear to auscultation without rales, some crackles in the bases, cleared with cough. ABDOMEN: Soft, non-tender, non-distended EXTREMITIES:  No edema; No deformity right wrist cath incision site well-healed without ecchymosis or pain.  ASSESSMENT AND PLAN: .    Coronary artery disease: Status post cath as above with CTO of the RCA with collaterals.  Minimal disease in the LAD none in the circumflex.  He will continue secondary prevention with blood pressure control, lipid management, purposeful exercise, smoking cessation, and weight loss.  2. Hypercholesterolemia: LDL less than 70 is recommended, he is on atorvastatin and Vascepa.  Follow-up labs in 3 months.  3.  Hypertension: Blood pressure is well-controlled currently.  No changes in regimen.  4.  Tobacco abuse: He remains on Chantix and is down to around 1 to 2 cigarettes a day in total.  He he often likes 1 and puts it out fairly quickly due to bad taste and dizziness.  I am encouraging him to have complete cessation for overall cardiovascular health.         Signed, Bettey Mare. Tanner Gomez, ANP, AACC

## 2023-08-23 ENCOUNTER — Ambulatory Visit: Payer: Commercial Managed Care - PPO | Attending: Adult Health | Admitting: Adult Health

## 2023-08-23 ENCOUNTER — Encounter: Payer: Self-pay | Admitting: Adult Health

## 2023-08-23 VITALS — BP 122/80 | HR 64 | Resp 16 | Ht 69.0 in | Wt 235.6 lb

## 2023-08-23 DIAGNOSIS — I2510 Atherosclerotic heart disease of native coronary artery without angina pectoris: Secondary | ICD-10-CM | POA: Diagnosis not present

## 2023-08-23 DIAGNOSIS — E7800 Pure hypercholesterolemia, unspecified: Secondary | ICD-10-CM

## 2023-08-23 DIAGNOSIS — Z720 Tobacco use: Secondary | ICD-10-CM | POA: Diagnosis not present

## 2023-08-23 DIAGNOSIS — I10 Essential (primary) hypertension: Secondary | ICD-10-CM

## 2023-08-23 NOTE — Patient Instructions (Signed)
Medication Instructions:  No Changes *If you need a refill on your cardiac medications before your next appointment, please call your pharmacy*   Lab Work: No Labs If you have labs (blood work) drawn today and your tests are completely normal, you will receive your results only by: [place] (if you have MyChart) OR A paper copy in the mail If you have any lab test that is abnormal or we need to change your treatment, we will call you to review the results.   Testing/Procedures: No Testing   Follow-Up: At [facility], you and your health needs are our priority.  As part of our continuing mission to provide you with exceptional heart care, we have created designated Provider Care Teams.  These Care Teams include your primary Cardiologist (physician) and Advanced Practice Providers (APPs -  Physician Assistants and Nurse Practitioners) who all work together to provide you with the care you need, when you need it.  We recommend signing up for the patient portal called "MyChart".  Sign up information is provided on this After Visit Summary.  MyChart is used to connect with patients for Virtual Visits (Telemedicine).  Patients are able to view lab/test results, encounter notes, upcoming appointments, etc.  Non-urgent messages can be sent to your provider as well.   To learn more about what you can do with MyChart, go to [email or web address].    Your next appointment:   6 month(s)  Provider:   Pixie Casino, MD

## 2023-10-20 ENCOUNTER — Other Ambulatory Visit: Payer: Self-pay | Admitting: Internal Medicine

## 2023-12-08 ENCOUNTER — Other Ambulatory Visit: Payer: Self-pay | Admitting: Internal Medicine

## 2023-12-10 ENCOUNTER — Other Ambulatory Visit: Payer: Self-pay

## 2023-12-14 ENCOUNTER — Encounter: Payer: Self-pay | Admitting: Internal Medicine

## 2023-12-14 ENCOUNTER — Ambulatory Visit: Payer: Commercial Managed Care - PPO | Admitting: Internal Medicine

## 2023-12-14 VITALS — BP 136/82 | HR 60 | Temp 97.8°F | Ht 69.0 in | Wt 246.0 lb

## 2023-12-14 DIAGNOSIS — M79604 Pain in right leg: Secondary | ICD-10-CM | POA: Diagnosis not present

## 2023-12-14 DIAGNOSIS — E538 Deficiency of other specified B group vitamins: Secondary | ICD-10-CM | POA: Diagnosis not present

## 2023-12-14 DIAGNOSIS — Z125 Encounter for screening for malignant neoplasm of prostate: Secondary | ICD-10-CM

## 2023-12-14 DIAGNOSIS — R739 Hyperglycemia, unspecified: Secondary | ICD-10-CM

## 2023-12-14 DIAGNOSIS — E559 Vitamin D deficiency, unspecified: Secondary | ICD-10-CM

## 2023-12-14 DIAGNOSIS — I10 Essential (primary) hypertension: Secondary | ICD-10-CM

## 2023-12-14 DIAGNOSIS — Z23 Encounter for immunization: Secondary | ICD-10-CM

## 2023-12-14 DIAGNOSIS — M79605 Pain in left leg: Secondary | ICD-10-CM

## 2023-12-14 DIAGNOSIS — Z0000 Encounter for general adult medical examination without abnormal findings: Secondary | ICD-10-CM

## 2023-12-14 DIAGNOSIS — N401 Enlarged prostate with lower urinary tract symptoms: Secondary | ICD-10-CM

## 2023-12-14 DIAGNOSIS — Z0001 Encounter for general adult medical examination with abnormal findings: Secondary | ICD-10-CM

## 2023-12-14 DIAGNOSIS — R351 Nocturia: Secondary | ICD-10-CM

## 2023-12-14 DIAGNOSIS — Z720 Tobacco use: Secondary | ICD-10-CM

## 2023-12-14 LAB — BASIC METABOLIC PANEL
BUN: 13 mg/dL (ref 6–23)
CO2: 25 meq/L (ref 19–32)
Calcium: 9.7 mg/dL (ref 8.4–10.5)
Chloride: 100 meq/L (ref 96–112)
Creatinine, Ser: 0.92 mg/dL (ref 0.40–1.50)
GFR: 91.81 mL/min (ref >60.00)
Glucose, Bld: 112 mg/dL — ABNORMAL HIGH (ref 70–99)
Potassium: 4.1 meq/L (ref 3.5–5.1)
Sodium: 138 meq/L (ref 135–145)

## 2023-12-14 LAB — CBC WITH DIFFERENTIAL/PLATELET
Basophils Absolute: 0.1 10*3/uL (ref 0.0–0.1)
Basophils Relative: 0.5 % (ref 0.0–3.0)
Eosinophils Absolute: 0.4 10*3/uL (ref 0.0–0.7)
Eosinophils Relative: 2.1 % (ref 0.0–5.0)
HCT: 43 % (ref 39.0–52.0)
Hemoglobin: 14.3 g/dL (ref 13.0–17.0)
Lymphocytes Relative: 12.5 % (ref 12.0–46.0)
Lymphs Abs: 2.2 10*3/uL (ref 0.7–4.0)
MCHC: 33.3 g/dL (ref 30.0–36.0)
MCV: 92.3 fl (ref 78.0–100.0)
Monocytes Absolute: 1.2 10*3/uL — ABNORMAL HIGH (ref 0.1–1.0)
Monocytes Relative: 6.9 % (ref 3.0–12.0)
Neutro Abs: 13.4 10*3/uL — ABNORMAL HIGH (ref 1.4–7.7)
Neutrophils Relative %: 78 % — ABNORMAL HIGH (ref 43.0–77.0)
Platelets: 262 10*3/uL (ref 150.0–400.0)
RBC: 4.66 Mil/uL (ref 4.22–5.81)
RDW: 13.9 % (ref 11.5–15.5)
WBC: 17.2 10*3/uL — ABNORMAL HIGH (ref 4.0–10.5)

## 2023-12-14 LAB — LIPID PANEL
Cholesterol: 138 mg/dL (ref 0–200)
HDL: 22.8 mg/dL — ABNORMAL LOW (ref >39.00)
LDL Cholesterol: 46 mg/dL (ref 0–99)
NonHDL: 114.8
Total CHOL/HDL Ratio: 6
Triglycerides: 342 mg/dL — ABNORMAL HIGH (ref 0.0–149.0)
VLDL: 68.4 mg/dL — ABNORMAL HIGH (ref 0.0–40.0)

## 2023-12-14 LAB — URINALYSIS, ROUTINE W REFLEX MICROSCOPIC
Bilirubin Urine: NEGATIVE
Hgb urine dipstick: NEGATIVE
Ketones, ur: NEGATIVE
Leukocytes,Ua: NEGATIVE
Nitrite: NEGATIVE
RBC / HPF: NONE SEEN (ref 0–?)
Specific Gravity, Urine: 1.01 (ref 1.000–1.030)
Total Protein, Urine: NEGATIVE
Urine Glucose: NEGATIVE
Urobilinogen, UA: 0.2 (ref 0.0–1.0)
WBC, UA: NONE SEEN (ref 0–?)
pH: 7 (ref 5.0–8.0)

## 2023-12-14 LAB — HEPATIC FUNCTION PANEL
ALT: 40 U/L (ref 0–53)
AST: 57 U/L — ABNORMAL HIGH (ref 0–37)
Albumin: 4.5 g/dL (ref 3.5–5.2)
Alkaline Phosphatase: 78 U/L (ref 39–117)
Bilirubin, Direct: 0.2 mg/dL (ref 0.0–0.3)
Total Bilirubin: 0.6 mg/dL (ref 0.2–1.2)
Total Protein: 7.9 g/dL (ref 6.0–8.3)

## 2023-12-14 LAB — TSH: TSH: 4.19 u[IU]/mL (ref 0.35–5.50)

## 2023-12-14 LAB — HEMOGLOBIN A1C: Hgb A1c MFr Bld: 6.3 % (ref 4.6–6.5)

## 2023-12-14 LAB — MICROALBUMIN / CREATININE URINE RATIO
Creatinine,U: 66.8 mg/dL
Microalb Creat Ratio: 10.5 mg/g (ref 0.0–30.0)
Microalb, Ur: <0.7 mg/dL (ref 0.0–1.9)

## 2023-12-14 LAB — VITAMIN D 25 HYDROXY (VIT D DEFICIENCY, FRACTURES): VITD: 62.32 ng/mL (ref 30.00–100.00)

## 2023-12-14 LAB — VITAMIN B12: Vitamin B-12: 979 pg/mL — ABNORMAL HIGH (ref 211–911)

## 2023-12-14 LAB — PSA: PSA: 0.12 ng/mL (ref 0.10–4.00)

## 2023-12-14 MED ORDER — SILDENAFIL CITRATE 100 MG PO TABS: 50.0000 mg | ORAL_TABLET | Freq: Every day | ORAL | 11 refills | Status: DC | PRN

## 2023-12-14 MED ORDER — CELECOXIB 200 MG PO CAPS: 200.0000 mg | ORAL_CAPSULE | Freq: Two times a day (BID) | ORAL | 3 refills | Status: DC

## 2023-12-14 MED ORDER — TAMSULOSIN HCL 0.4 MG PO CAPS: 0.4000 mg | ORAL_CAPSULE | Freq: Every day | ORAL | 3 refills | Status: AC

## 2023-12-14 NOTE — Progress Notes (Signed)
 The test results show that your current treatment is OK, as the tests are stable.  Please continue the same plan.  There is no other need for change of treatment or further evaluation based on these results, at this time.  thanks

## 2023-12-14 NOTE — Progress Notes (Signed)
 Patient ID: Tanner Gomez, male   DOB: [birth year], 58 y.o.   MRN: [id number]         Chief Complaint:: wellness exam and Medical Management of Chronic Issues (6 month follow up, says his legs feel weak and having constant back pain )  Bph, low b12, htn, hyperglycemia, low vit d       HPI:  Tanner Gomez is a 58 y.o. male here for wellness exam; for shingrix at pharmacy, for prevnar 20 today, o/w up to date;  still smoking, not ready to quit                        Also hip pain improved since his last xray.  Did have 1 wk off with URI symptoms in dec 2024, but noticed less leg stamina as well as worsening bilat leg pain below the knees with walking, better to rest.  Did quit smoking.  Gained 10 lbs with stop smoking. Pt denies chest pain, increased sob or doe, wheezing, orthopnea, PND, increased LE swelling, palpitations, dizziness or syncope.   Pt denies polydipsia, polyuria, or new focal neuro s/s.    Pt denies fever, wt loss, night sweats, loss of appetite, or other constitutional symptoms Denies urinary symptoms such as dysuria, frequency, urgency, flank pain, hematuria or n/v, fever, chillsl but has some weak stream and nocturia. Wt Readings from Last 3 Encounters:  12/14/23 246 lb (111.6 kg)  08/23/23 235 lb 9.6 oz (106.9 kg)  07/30/23 236 lb (107 kg)   BP Readings from Last 3 Encounters:  12/14/23 136/82  08/23/23 122/80  07/30/23 125/65   Immunization History  Administered Date(s) Administered   Influenza,inj,Quad PF,6+ Mos 06/24/2014, 06/30/2015, 06/15/2017, 11/27/2018   PFIZER(Purple Top)SARS-COV-2 Vaccination 04/25/2020, 05/16/2020   PNEUMOCOCCAL CONJUGATE-20 12/14/2023   Tdap 07/29/2008, 11/27/2018   Health Maintenance Due  Topic Date Due   Zoster Vaccines- Shingrix (1 of 2) Never done      Past Medical History:  Diagnosis Date   Depression    Hyperlipidemia    Hypertension    Pancreatitis    Tobacco abuse    Past Surgical History:  Procedure Laterality Date   EYE  SURGERY     RIGHT (CHILDHOOD)   JOINT REPLACEMENT     LEFT HEART CATH AND CORONARY ANGIOGRAPHY N/A 07/30/2023   Procedure: LEFT HEART CATH AND CORONARY ANGIOGRAPHY;  Surgeon: [place], Peter M, MD;  Location: [facility];  Service: Cardiovascular;  Laterality: N/A;    reports that he has been smoking cigarettes. He has a 74 pack-year smoking history. He has never used smokeless tobacco. He reports current alcohol use of about 33.0 standard drinks of alcohol per week. He reports that he does not use drugs. family history includes Diabetes in his father; Heart attack in his maternal grandmother, mother, and paternal grandfather; Heart disease in his mother; Hypertension in his father and mother; Kidney Stones in his sister; Kidney disease in his mother. Allergies  Allergen Reactions   Sular Swelling   Current Outpatient Medications on File Prior to Visit  Medication Sig Dispense Refill   ALPRAZolam (XANAX) 1 MG tablet TAKE 1 TABLET BY MOUTH THREE TIMES A DAY AS NEEDED 90 tablet 2   amLODipine (NORVASC) 10 MG tablet TAKE 1 TABLET BY MOUTH EVERY DAY 90 tablet 3   ascorbic acid (VITAMIN C) 500 MG tablet Take 500 mg by mouth daily.     aspirin EC 81 MG tablet Take 1  tablet (81 mg total) by mouth daily. Swallow whole. 100 tablet 11   atenolol (TENORMIN) 100 MG tablet TAKE 1 TABLET BY MOUTH DAILY. MUST KEEP SCHEDULE APPT W/NEW PROVIDER FOR FUTURE REFILLS 30 tablet 11   atorvastatin (LIPITOR) 80 MG tablet TAKE 1 TABLET BY MOUTH EVERY DAY 90 tablet 3   Cholecalciferol (VITAMIN D3) 250 MCG (10000 UT) capsule Take 20,000 Units by mouth daily.     Coenzyme Q10 100 MG capsule Take 100 mg by mouth daily.      cyanocobalamin (VITAMIN B12) 500 MCG tablet Take 500 mcg by mouth daily.     fenofibrate 160 MG tablet TAKE 1 TABLET BY MOUTH EVERY DAY 30 tablet 11   icosapent Ethyl (VASCEPA) 1 g capsule Take 2 capsules (2 g total) by mouth 2 (two) times daily. 120 capsule 11   magnesium gluconate (MAGONATE)  500 MG tablet Take 500 mg by mouth daily.     Multiple Vitamin (MULTIVITAMIN WITH MINERALS) TABS tablet Take 2 tablets by mouth daily.     omeprazole (PRILOSEC) 40 MG capsule TAKE 1 CAPSULE BY MOUTH EVERY DAY 90 capsule 3   PRESCRIPTION MEDICATION Pt uses CPAP Machine     varenicline (CHANTIX) 1 MG tablet TAKE 1 TABLET BY MOUTH TWICE A DAY 60 tablet 1   Varenicline Tartrate, Starter, (CHANTIX STARTING MONTH PAK) 0.5 MG X 11 & 1 MG X 42 TBPK Take as directed by mouth daily 53 each 0   No current facility-administered medications on file prior to visit.        ROS:  All others reviewed and negative.  Objective        PE:  BP 136/82 (BP Location: Left Arm, Patient Position: Sitting, Cuff Size: Normal)   Pulse 60   Temp 97.8 F (36.6 C) (Oral)   Ht 5\' 9"  (1.753 m)   Wt 246 lb (111.6 kg)   SpO2 98%   BMI 36.33 kg/m                 Constitutional: Pt appears in NAD               HENT: Head: NCAT.                Right Ear: External ear normal.                 Left Ear: External ear normal.                Eyes: . Pupils are equal, round, and reactive to light. Conjunctivae and EOM are normal               Nose: without d/c or deformity               Neck: Neck supple. Gross normal ROM               Cardiovascular: Normal rate and regular rhythm.                 Pulmonary/Chest: Effort normal and breath sounds without rales or wheezing.                Abd:  Soft, NT, ND, + BS, no organomegaly               Neurological: Pt is alert. At baseline orientation, motor grossly intact               Skin: Skin is warm. No rashes, no other new lesions, LE  edema - none               Psychiatric: Pt behavior is normal without agitation   Micro: none  Cardiac tracings I have personally interpreted today:  none  Pertinent Radiological findings (summarize): none   Lab Results  Component Value Date   WBC 17.2 (H) 12/14/2023   HGB 14.3 12/14/2023   HCT 43.0 12/14/2023   PLT 262.0 12/14/2023    GLUCOSE 112 (H) 12/14/2023   CHOL 138 12/14/2023   TRIG 342.0 (H) 12/14/2023   HDL 22.80 (L) 12/14/2023   LDLDIRECT 82.0 06/14/2023   LDLCALC 46 12/14/2023   ALT 40 12/14/2023   AST 57 (H) 12/14/2023   NA 138 12/14/2023   K 4.1 12/14/2023   CL 100 12/14/2023   CREATININE 0.92 12/14/2023   BUN 13 12/14/2023   CO2 25 12/14/2023   TSH 4.19 12/14/2023   PSA 0.12 12/14/2023   INR 1.61 (H) 03/07/2011   HGBA1C 6.3 12/14/2023   MICROALBUR <0.7 12/14/2023   Assessment/Plan:  SHAI MCKENZIE is a 58 y.o. White or Caucasian [1] male with  has a past medical history of Depression, Hyperlipidemia, Hypertension, Pancreatitis, and Tobacco abuse.  Encounter for well adult exam with abnormal findings Age and sex appropriate education and counseling updated with regular exercise and diet Referrals for preventative services - none needed Immunizations addressed - for shingrix at pharmacy, for prevnar 20 today Smoking counseling  - pt counsled to quit, pt not ready Evidence for depression or other mood disorder - none significant Most recent labs reviewed. I have personally reviewed and have noted: 1) the patient's medical and social history 2) The patient's current medications and supplements 3) The patient's height, weight, and BMI have been recorded in the chart   B12 deficiency Lab Results  Component Value Date   VITAMINB12 979 (H) 12/14/2023   Stable, cont oral replacement - b12 1000 mcg qd   HTN (hypertension) BP Readings from Last 3 Encounters:  12/14/23 136/82  08/23/23 122/80  07/30/23 125/65   Stable, pt to continue medical treatment norvasc 10 every day, tenormin 100 qd   Hyperglycemia Lab Results  Component Value Date   HGBA1C 6.3 12/14/2023   Stable, pt to continue current medical treatment  - diet, wt control   Tobacco abuse Pt counsled to quit, pt not ready  Vitamin D deficiency Last vitamin D Lab Results  Component Value Date   VD25OH 62.32 12/14/2023    Stable, cont oral replacement   BPH associated with nocturia Mild to mod worsening, for flomax 0.4 mg every day, declines urology for now  Bilateral leg pain Can't r/o PAD - for arterial dopplers  Followup: Return in about 6 months (around 06/12/2024).  Oliver Barre, MD 12/16/2023 9:46 PM [facility]

## 2023-12-14 NOTE — Patient Instructions (Addendum)
 Please have your Shingrix (shingles) shots done at your local pharmacy.  You had the Prevnar 20 pneumonia shot  Please take all new medication as prescribed - the celebrex 200 mg up to twice per day as needed for pain  Please take all new medication as prescribed - the flomax for the prostate but ok to stop if you do not seem good enough improvement with this in 1 week  Please continue all other medications as before, and refills have been done if requested.  Please have the pharmacy call with any other refills you may need.  Please continue your efforts at being more active, low cholesterol diet, and weight control.  You are otherwise up to date with prevention measures today.  Please keep your appointments with your specialists as you may have planned  You will be contacted regarding the referral for: leg circulation testing  Please go to the LAB at the blood drawing area for the tests to be done  You will be contacted by phone if any changes need to be made immediately.  Otherwise, you will receive a letter about your results with an explanation, but please check with MyChart first.  Please make an Appointment to return in 6 months, or sooner if needed

## 2023-12-16 ENCOUNTER — Encounter: Payer: Self-pay | Admitting: Internal Medicine

## 2023-12-16 DIAGNOSIS — M79604 Pain in right leg: Secondary | ICD-10-CM | POA: Insufficient documentation

## 2023-12-16 DIAGNOSIS — N401 Enlarged prostate with lower urinary tract symptoms: Secondary | ICD-10-CM | POA: Insufficient documentation

## 2023-12-16 NOTE — Assessment & Plan Note (Signed)
 Mild to mod worsening, for flomax 0.4 mg every day, declines urology for now

## 2023-12-16 NOTE — Assessment & Plan Note (Signed)
 BP Readings from Last 3 Encounters:  12/14/23 136/82  08/23/23 122/80  07/30/23 125/65   Stable, pt to continue medical treatment norvasc 10 every day, tenormin 100 qd

## 2023-12-16 NOTE — Assessment & Plan Note (Signed)
 Last vitamin D Lab Results  Component Value Date   VD25OH 62.32 12/14/2023   Stable, cont oral replacement

## 2023-12-16 NOTE — Assessment & Plan Note (Signed)
 Lab Results  Component Value Date   VITAMINB12 979 (H) 12/14/2023   Stable, cont oral replacement - b12 1000 mcg qd

## 2023-12-16 NOTE — Assessment & Plan Note (Signed)
 Lab Results  Component Value Date   HGBA1C 6.3 12/14/2023   Stable, pt to continue current medical treatment  - diet, wt control

## 2023-12-16 NOTE — Assessment & Plan Note (Signed)
 Pt counsled to quit, pt not ready

## 2023-12-16 NOTE — Assessment & Plan Note (Addendum)
 Age and sex appropriate education and counseling updated with regular exercise and diet Referrals for preventative services - none needed Immunizations addressed - for shingrix at pharmacy, for prevnar 20 today Smoking counseling  - pt counsled to quit, pt not ready Evidence for depression or other mood disorder - none significant Most recent labs reviewed. I have personally reviewed and have noted: 1) the patient's medical and social history 2) The patient's current medications and supplements 3) The patient's height, weight, and BMI have been recorded in the chart

## 2023-12-16 NOTE — Assessment & Plan Note (Signed)
 Can't r/o PAD - for arterial dopplers

## 2023-12-26 ENCOUNTER — Other Ambulatory Visit: Payer: Self-pay

## 2023-12-26 ENCOUNTER — Other Ambulatory Visit: Payer: Self-pay | Admitting: Internal Medicine

## 2023-12-26 DIAGNOSIS — I10 Essential (primary) hypertension: Secondary | ICD-10-CM

## 2024-01-01 ENCOUNTER — Other Ambulatory Visit: Payer: Self-pay | Admitting: Internal Medicine

## 2024-01-01 ENCOUNTER — Other Ambulatory Visit: Payer: Self-pay

## 2024-01-04 ENCOUNTER — Encounter: Payer: Self-pay | Admitting: Internal Medicine

## 2024-01-04 ENCOUNTER — Ambulatory Visit (HOSPITAL_COMMUNITY)
Admission: RE | Admit: 2024-01-04 | Discharge: 2024-01-04 | Disposition: A | Discharge status: Home / Self Care | Payer: Commercial Managed Care - PPO | Source: Ambulatory Visit | Attending: Internal Medicine | Admitting: Internal Medicine

## 2024-01-04 DIAGNOSIS — M79604 Pain in right leg: Secondary | ICD-10-CM | POA: Insufficient documentation

## 2024-01-04 DIAGNOSIS — M79605 Pain in left leg: Secondary | ICD-10-CM | POA: Insufficient documentation

## 2024-01-04 NOTE — Progress Notes (Signed)
 The test results show that your current treatment is OK, as the tests are stable.  Please continue the same plan.  There is no other need for change of treatment or further evaluation based on these results, at this time.  thanks

## 2024-01-28 ENCOUNTER — Other Ambulatory Visit: Payer: Self-pay | Admitting: Internal Medicine

## 2024-02-22 ENCOUNTER — Telehealth: Payer: Self-pay | Admitting: Pharmacy Technician

## 2024-02-22 ENCOUNTER — Other Ambulatory Visit (HOSPITAL_COMMUNITY): Payer: Self-pay

## 2024-02-22 ENCOUNTER — Encounter: Payer: Self-pay | Admitting: Pharmacy Technician

## 2024-02-22 NOTE — Telephone Encounter (Signed)
 Pharmacy Patient Advocate Encounter   Received notification from CoverMyMeds that prior authorization for icosapent  1gm is required/requested.   Insurance verification completed.   The patient is insured through [employer] .   Per test claim: PA required; PA submitted to above mentioned insurance via Prompt PA Key/confirmation #/EOC [id number] Status is pending   Cmm rejected so did pa in promptpa

## 2024-02-22 NOTE — Telephone Encounter (Signed)
 Pharmacy Patient Advocate Encounter  Received notification from [employer] that Prior Authorization for ICOSAPENT  1GM has been APPROVED from 02/22/24 to 02/20/25. Spoke to pharmacy to process.Copay is $10.00.

## 2024-03-27 ENCOUNTER — Ambulatory Visit: Payer: Self-pay

## 2024-03-27 NOTE — Telephone Encounter (Addendum)
 FYI Only or Action Required?: FYI only for provider  Patient was last seen in primary care on 12/14/2023 by Tanner Coombe, MD. Called Nurse Triage reporting Leg Swelling. Symptoms began x 2 weeks ago. Interventions attempted: Nothing. Symptoms are: unchanged.  Triage Disposition: See Physician Within 24 Hours  Patient/caregiver understands and will follow disposition?: YesYES            Copied from CRM [id number]. Topic: Clinical - Red Word Triage >> Mar 27, 2024  8:29 AM Dimple Francis wrote: Red Word that prompted transfer to Nurse Triage: Shoulder issues and lower back pain. Both of patients legs are swollen and stinging and one toe is starting to turn dark brown.   ----------------------------------------------------------------------- From previous Reason for Contact - Scheduling: Patient/patient representative is calling to schedule an appointment. Refer to attachments for appointment information. Reason for Disposition  [1] MODERATE leg swelling (e.g., swelling extends up to knees) AND [2] new-onset or worsening  Answer Assessment - Initial Assessment Questions 1. ONSET: When did the swelling start? (e.g., minutes, hours, days)     X 2 weeks  2. LOCATION: What part of the leg is swollen?  Are both legs swollen or just one leg?     BIL legs and BIL feet  3. SEVERITY: How bad is the swelling? (e.g., localized; mild, moderate, severe)   - Localized: Small area of swelling localized to one leg.   - MILD pedal edema: Swelling limited to foot and ankle, pitting edema < 1/4 inch (6 mm) deep, rest and elevation eliminate most or all swelling.   - MODERATE edema: Swelling of lower leg to knee, pitting edema > 1/4 inch (6 mm) deep, rest and elevation only partially reduce swelling.   - SEVERE edema: Swelling extends above knee, facial or hand swelling present.      Severe last week, swelling went down. Moderate, pitting edema present by ankle.  4. REDNESS: Does the swelling  look red or infected?     No  5. PAIN: Is the swelling painful to touch? If Yes, ask: How painful is it?   (Scale 1-10; mild, moderate or severe)    No  6. FEVER: Do you have a fever? If Yes, ask: What is it, how was it measured, and when did it start?      No  7. CAUSE: What do you think is causing the leg swelling?     Unknown, no diagnosis  8. MEDICAL HISTORY: Do you have a history of blood clots (e.g., DVT), cancer, heart failure, kidney disease, or liver failure?     Blood clot in heart, and right arm.  9. RECURRENT SYMPTOM: Have you had leg swelling before? If Yes, ask: When was the last time? What happened that time?     No  10. OTHER SYMPTOMS: Do you have any other symptoms? (e.g., chest pain, difficulty breathing)      No   Tingling present BIL Legs, Pins and needles, tingling not painful, just uncomfortable sensation.  Protocols used: Leg Swelling and Edema-A-AH

## 2024-03-28 ENCOUNTER — Ambulatory Visit: Admitting: Family

## 2024-03-28 ENCOUNTER — Encounter: Payer: Self-pay | Admitting: Family

## 2024-03-28 VITALS — BP 124/80 | HR 62 | Temp 97.8°F | Ht 69.0 in | Wt 256.0 lb

## 2024-03-28 DIAGNOSIS — R600 Localized edema: Secondary | ICD-10-CM | POA: Diagnosis not present

## 2024-03-28 DIAGNOSIS — I10 Essential (primary) hypertension: Secondary | ICD-10-CM | POA: Diagnosis not present

## 2024-03-28 LAB — CBC WITH DIFFERENTIAL/PLATELET
Basophils Absolute: 0 10*3/uL (ref 0.0–0.1)
Basophils Relative: 0.4 % (ref 0.0–3.0)
Eosinophils Absolute: 0.3 10*3/uL (ref 0.0–0.7)
Eosinophils Relative: 2.2 % (ref 0.0–5.0)
HCT: 41 % (ref 39.0–52.0)
Hemoglobin: 13.6 g/dL (ref 13.0–17.0)
Lymphocytes Relative: 19.6 % (ref 12.0–46.0)
Lymphs Abs: 2.4 10*3/uL (ref 0.7–4.0)
MCHC: 33.3 g/dL (ref 30.0–36.0)
MCV: 89.9 fl (ref 78.0–100.0)
Monocytes Absolute: 1.1 10*3/uL — ABNORMAL HIGH (ref 0.1–1.0)
Monocytes Relative: 9.1 % (ref 3.0–12.0)
Neutro Abs: 8.3 10*3/uL — ABNORMAL HIGH (ref 1.4–7.7)
Neutrophils Relative %: 68.7 % (ref 43.0–77.0)
Platelets: 232 10*3/uL (ref 150.0–400.0)
RBC: 4.56 Mil/uL (ref 4.22–5.81)
RDW: 13.9 % (ref 11.5–15.5)
WBC: 12.1 10*3/uL — ABNORMAL HIGH (ref 4.0–10.5)

## 2024-03-28 LAB — COMPREHENSIVE METABOLIC PANEL WITH GFR
ALT: 48 U/L (ref 0–53)
AST: 62 U/L — ABNORMAL HIGH (ref 0–37)
Albumin: 4.4 g/dL (ref 3.5–5.2)
Alkaline Phosphatase: 55 U/L (ref 39–117)
BUN: 14 mg/dL (ref 6–23)
CO2: 29 meq/L (ref 19–32)
Calcium: 9.6 mg/dL (ref 8.4–10.5)
Chloride: 99 meq/L (ref 96–112)
Creatinine, Ser: 1.02 mg/dL (ref 0.40–1.50)
GFR: 80.95 mL/min (ref >60.00)
Glucose, Bld: 103 mg/dL — ABNORMAL HIGH (ref 70–99)
Potassium: 4 meq/L (ref 3.5–5.1)
Sodium: 136 meq/L (ref 135–145)
Total Bilirubin: 0.6 mg/dL (ref 0.2–1.2)
Total Protein: 8 g/dL (ref 6.0–8.3)

## 2024-03-28 LAB — BRAIN NATRIURETIC PEPTIDE: Pro B Natriuretic peptide (BNP): 33 pg/mL (ref 0.0–100.0)

## 2024-03-28 NOTE — Patient Instructions (Addendum)
 Stop amlodipine  10 mg once daily.  Low-sodium diet.  Will follow-up pending labs.

## 2024-03-28 NOTE — Progress Notes (Unsigned)
 Acute Office Visit  Subjective:     Patient ID: Tanner Gomez, male    DOB: 1966/01/12, 58 y.o.   MRN: [id number]  Chief Complaint  Patient presents with   Leg Swelling    Bilateral leg swelling, has been going on for over 2 weeks possibly 3. Really got worse last week. Tingling in both legs and feet especially with shoes. Denies pain    HPI Patient is in today with complaints of swelling in his lower extremities that is worsened over the last 3 to 4 weeks.  He has a history of hypertension.  Taking amlodipine  10 mg once daily and tolerating it well.  Reports a low-sodium diet since he has stopped smoking.  Denies any chest pain or shortness of breath.  Review of Systems  Cardiovascular:  Positive for leg swelling.  All other systems reviewed and are negative. Past Medical History:  Diagnosis Date   Anxiety    Arthritis 1 year   Depression    GERD (gastroesophageal reflux disease)    Hyperlipidemia    Hypertension    Pancreatitis    Sleep apnea 10/16/2018   Tobacco abuse     Social History   Socioeconomic History   Marital status: Divorced    Spouse name: Not on file   Number of children: 0   Years of education: 12   Highest education level: 12th grade  Occupational History   Occupation: Engineer, materials  Tobacco Use   Smoking status: Every Day    Current packs/day: 2.00    Average packs/day: 2.0 packs/day for 37.0 years (74.0 ttl pk-yrs)    Types: Cigarettes   Smokeless tobacco: Never  Vaping Use   Vaping status: Never Used  Substance and Sexual Activity   Alcohol use: Yes    Alcohol/week: 33.0 standard drinks of alcohol    Types: 21 Cans of beer, 12 Standard drinks or equivalent per week   Drug use: No   Sexual activity: Yes    Birth control/protection: None    Comment: NUMBER OF SEX PARTNERS IN THE LAST 12 MONTHS 1  Other Topics Concern   Not on file  Social History Narrative   Fun: Ride [place], play guitar    Social Drivers of Health    Financial Resource Strain: Low Risk  (12/13/2023)   Overall Financial Resource Strain (CARDIA)    Difficulty of Paying Living Expenses: Not hard at all  Food Insecurity: No Food Insecurity (12/13/2023)   Hunger Vital Sign    Worried About Running Out of Food in the Last Year: Never true    Ran Out of Food in the Last Year: Never true  Transportation Needs: No Transportation Needs (12/13/2023)   PRAPARE - Administrator, Civil Service (Medical): No    Lack of Transportation (Non-Medical): No  Physical Activity: Unknown (12/13/2023)   Exercise Vital Sign    Days of Exercise per Week: 0 days    Minutes of Exercise per Session: Not on file  Stress: Stress Concern Present (12/13/2023)   [employer] of Occupational Health - Occupational Stress Questionnaire    Feeling of Stress : To some extent  Social Connections: Socially Isolated (12/13/2023)   Social Connection and Isolation Panel    Frequency of Communication with Friends and Family: More than three times a week    Frequency of Social Gatherings with Friends and Family: Once a week    Attends Religious Services: Never    Database administrator or  Organizations: No    Attends Engineer, structural: Not on file    Marital Status: Divorced  Catering manager Violence: Not on file    Past Surgical History:  Procedure Laterality Date   EYE SURGERY     RIGHT (CHILDHOOD)   FRACTURE SURGERY     JOINT REPLACEMENT     LEFT HEART CATH AND CORONARY ANGIOGRAPHY N/A 07/30/2023   Procedure: LEFT HEART CATH AND CORONARY ANGIOGRAPHY;  Surgeon: Swaziland, Peter M, MD;  Location: [facility];  Service: Cardiovascular;  Laterality: N/A;    Family History  Problem Relation Age of Onset   Kidney disease Mother    Hypertension Mother    Heart disease Mother    Heart attack Mother    Diabetes Father    Hypertension Father    Kidney Stones Sister    Heart attack Maternal Grandmother    Heart attack Paternal  Grandfather    Colon cancer Neg Hx     Allergies  Allergen Reactions   Sular Swelling    Current Outpatient Medications on File Prior to Visit  Medication Sig Dispense Refill   ALPRAZolam  (XANAX ) 1 MG tablet TAKE 1 TABLET BY MOUTH THREE TIMES A DAY AS NEEDED 90 tablet 2   ascorbic acid (VITAMIN C) 500 MG tablet Take 500 mg by mouth daily.     aspirin  EC 81 MG tablet Take 1 tablet (81 mg total) by mouth daily. Swallow whole. 100 tablet 11   atenolol  (TENORMIN ) 100 MG tablet TAKE 1 TABLET BY MOUTH DAILY. MUST KEEP SCHEDULE APPT W/NEW PROVIDER FOR FUTURE REFILLS 30 tablet 11   atorvastatin  (LIPITOR ) 80 MG tablet TAKE 1 TABLET BY MOUTH EVERY DAY 90 tablet 3   celecoxib  (CELEBREX ) 200 MG capsule Take 1 capsule (200 mg total) by mouth 2 (two) times daily. 180 capsule 3   Cholecalciferol (VITAMIN D3) 250 MCG (10000 UT) capsule Take 20,000 Units by mouth daily.     Coenzyme Q10 100 MG capsule Take 100 mg by mouth daily.      cyanocobalamin  (VITAMIN B12) 500 MCG tablet Take 500 mcg by mouth daily.     fenofibrate  160 MG tablet TAKE 1 TABLET BY MOUTH EVERY DAY 30 tablet 11   icosapent  Ethyl (VASCEPA ) 1 g capsule Take 2 capsules (2 g total) by mouth 2 (two) times daily. 120 capsule 11   magnesium  gluconate (MAGONATE) 500 MG tablet Take 500 mg by mouth daily.     Multiple Vitamin (MULTIVITAMIN WITH MINERALS) TABS tablet Take 2 tablets by mouth daily.     omeprazole  (PRILOSEC) 40 MG capsule TAKE 1 CAPSULE BY MOUTH EVERY DAY 90 capsule 3   PRESCRIPTION MEDICATION Pt uses CPAP Machine     sildenafil  (VIAGRA ) 100 MG tablet Take 0.5-1 tablets (50-100 mg total) by mouth daily as needed for erectile dysfunction. 15 tablet 11   tamsulosin  (FLOMAX ) 0.4 MG CAPS capsule Take 1 capsule (0.4 mg total) by mouth daily. 90 capsule 3   varenicline  (CHANTIX ) 1 MG tablet TAKE 1 TABLET BY MOUTH TWICE A DAY (Patient taking differently: Take 1 mg by mouth as needed.) 180 tablet 1   No current facility-administered  medications on file prior to visit.    BP 124/80 (BP Location: Left Arm, Patient Position: Sitting)   Pulse 62   Temp 97.8 F (36.6 C) (Temporal)   Ht 5' 9 (1.753 m)   Wt 256 lb (116.1 kg)   SpO2 97%   BMI 37.80 kg/m chart  Objective:    BP 124/80 (BP Location: Left Arm, Patient Position: Sitting)   Pulse 62   Temp 97.8 F (36.6 C) (Temporal)   Ht 5' 9 (1.753 m)   Wt 256 lb (116.1 kg)   SpO2 97%   BMI 37.80 kg/m  {Vitals History (Optional):23777}  Physical Exam Vitals and nursing note reviewed.  Constitutional:      Appearance: Normal appearance. He is obese.   Cardiovascular:     Rate and Rhythm: Normal rate and regular rhythm.     Pulses: Normal pulses.     Heart sounds: Normal heart sounds.  Pulmonary:     Effort: Pulmonary effort is normal.     Breath sounds: Normal breath sounds.  Abdominal:     Palpations: Abdomen is soft.     Tenderness: There is no abdominal tenderness. There is no guarding or rebound.   Musculoskeletal:        General: Swelling present.     Right lower leg: Edema present.     Left lower leg: Edema present.   Skin:    General: Skin is warm and dry.   Neurological:     Mental Status: He is alert and oriented to person, place, and time. Mental status is at baseline.   Psychiatric:        Mood and Affect: Mood normal.        Behavior: Behavior normal.        Thought Content: Thought content normal.   No results found for any visits on 03/28/24.      Assessment & Plan:   Problem List Items Addressed This Visit     HTN (hypertension)   Relevant Orders   CMP   POC Urinalysis Dipstick   CBC w/Diff   Other Visit Diagnoses       Peripheral edema    -  Primary   Relevant Orders   Brain natriuretic peptide   POC Urinalysis Dipstick   CBC w/Diff      Labs obtained today.  Will notify patient pending results.  UA is clear for significant proteinuria.  Stop amlodipine .  Call the office if symptoms worsen or persist.   Recheck in 2 weeks and sooner as needed.  Will likely need to start a different blood pressure agent. No orders of the defined types were placed in this encounter.   No follow-ups on file.  Geramy Lamorte B Rashan Patient, FNP

## 2024-03-29 ENCOUNTER — Ambulatory Visit: Payer: Self-pay | Admitting: Family

## 2024-04-01 ENCOUNTER — Other Ambulatory Visit: Payer: Self-pay | Admitting: Family

## 2024-04-01 MED ORDER — FUROSEMIDE 20 MG PO TABS
20.0000 mg | ORAL_TABLET | Freq: Every day | ORAL | 0 refills | Status: DC
Start: 2024-04-01 — End: 2024-04-11

## 2024-04-03 ENCOUNTER — Encounter: Payer: Self-pay | Admitting: Internal Medicine

## 2024-04-11 ENCOUNTER — Ambulatory Visit: Admitting: Internal Medicine

## 2024-04-11 ENCOUNTER — Encounter: Payer: Self-pay | Admitting: Internal Medicine

## 2024-04-11 ENCOUNTER — Ambulatory Visit (INDEPENDENT_AMBULATORY_CARE_PROVIDER_SITE_OTHER)

## 2024-04-11 VITALS — BP 138/84 | HR 56 | Temp 98.5°F | Ht 69.0 in | Wt 258.0 lb

## 2024-04-11 DIAGNOSIS — M545 Low back pain, unspecified: Secondary | ICD-10-CM | POA: Diagnosis not present

## 2024-04-11 DIAGNOSIS — M25511 Pain in right shoulder: Secondary | ICD-10-CM

## 2024-04-11 DIAGNOSIS — G8929 Other chronic pain: Secondary | ICD-10-CM

## 2024-04-11 DIAGNOSIS — I10 Essential (primary) hypertension: Secondary | ICD-10-CM | POA: Diagnosis not present

## 2024-04-11 DIAGNOSIS — R600 Localized edema: Secondary | ICD-10-CM | POA: Insufficient documentation

## 2024-04-11 MED ORDER — VALSARTAN 160 MG PO TABS: 160.0000 mg | ORAL_TABLET | Freq: Every day | ORAL | 3 refills | Status: DC

## 2024-04-11 MED ORDER — BACLOFEN 10 MG PO TABS: 10.0000 mg | ORAL_TABLET | Freq: Three times a day (TID) | ORAL | 2 refills | Status: DC

## 2024-04-11 MED ORDER — FUROSEMIDE 20 MG PO TABS: 20.0000 mg | ORAL_TABLET | Freq: Two times a day (BID) | ORAL | 11 refills | Status: DC | PRN

## 2024-04-11 NOTE — Patient Instructions (Addendum)
 Please take all new medication as prescribed - the diovan 160 mg per day  Continue to HOLD off on taking the amlodipine   Ok to change the lasix  (furosemide ) to 20 mg twice  per if you have persistent swelling  Please take all new medication as prescribed - the baclofen as needed for the lower back  Please continue all other medications as before, and refills have been done if requested.  Please have the pharmacy call with any other refills you may need.  Please keep your appointments with your specialists as you may have planned  You will be contacted regarding the referral for: Sports Medicine  Please go to the XRAY Department in the first floor for the x-ray testing  You will be contacted by phone if any changes need to be made immediately.  Otherwise, you will receive a letter about your results with an explanation, but please check with MyChart first.  Please make an Appointment to return in Aug 29, or sooner if needed

## 2024-04-11 NOTE — Assessment & Plan Note (Signed)
 Likely AC joint or rot cuff related - for xray, also refer sports medicine

## 2024-04-11 NOTE — Assessment & Plan Note (Signed)
 BP Readings from Last 3 Encounters:  04/11/24 138/84  03/28/24 124/80  12/14/23 136/82   uncontrolled, pt to continue medical treatment tenormin  100 every day, also add diovan 160 every day, fu bp at home and next visit

## 2024-04-11 NOTE — Assessment & Plan Note (Signed)
 Recent BNP and BMP normal, and overall improved swelling,  ok for lasix  20 mg bid prn only, cont off the amlodipine  10 mg

## 2024-04-11 NOTE — Assessment & Plan Note (Signed)
 Exam cw likely msk strain mild recurrent, for baclofen tid prn trial, to f/u any worsening symptoms or concerns

## 2024-04-11 NOTE — Progress Notes (Signed)
 Patient ID: Tanner Gomez, male   DOB: Apr 04, 1966, 58 y.o.   MRN: [id number]        Chief Complaint: follow up leg swelling, htn, right lower back pain, low back pain       HPI:  Tanner Gomez is a 58 y.o. male here with c/o worsening leg swelling last visit and amlodipine  10 mg stopped, and rx lasix  20 mg per day, to which he been compliant, swelling much improved from the waist to the feet, but still mild persistent  BP has been higher at home similar to today.  Pt continues to have recurring LBP without change in severity, bowel or bladder change, fever, wt loss,  worsening LE pain/numbness/weakness, gait change or falls, and asking for baclofen trial as this seemed to help his wife.  Also mentions worsening right shoulder pain localized there without neck or other distal arm pain or numbness.  Has reduced ROM and points to area just posterior to the [facility] joint area .         Wt Readings from Last 3 Encounters:  04/11/24 258 lb (117 kg)  03/28/24 256 lb (116.1 kg)  12/14/23 246 lb (111.6 kg)   BP Readings from Last 3 Encounters:  04/11/24 138/84  03/28/24 124/80  12/14/23 136/82         Past Medical History:  Diagnosis Date   Anxiety    Arthritis 1 year   Depression    GERD (gastroesophageal reflux disease)    Hyperlipidemia    Hypertension    Pancreatitis    Sleep apnea 10/16/2018   Tobacco abuse    Past Surgical History:  Procedure Laterality Date   EYE SURGERY     RIGHT (CHILDHOOD)   FRACTURE SURGERY     JOINT REPLACEMENT     LEFT HEART CATH AND CORONARY ANGIOGRAPHY N/A 07/30/2023   Procedure: LEFT HEART CATH AND CORONARY ANGIOGRAPHY;  Surgeon: Swaziland, Peter M, MD;  Location: [facility];  Service: Cardiovascular;  Laterality: N/A;    reports that he has been smoking cigarettes. He has a 74 pack-year smoking history. He has never used smokeless tobacco. He reports current alcohol use of about 33.0 standard drinks of alcohol per week. He reports that he does not use  drugs. family history includes Diabetes in his father; Heart attack in his maternal grandmother, mother, and paternal grandfather; Heart disease in his mother; Hypertension in his father and mother; Kidney Stones in his sister; Kidney disease in his mother. Allergies  Allergen Reactions   Sular Swelling   Current Outpatient Medications on File Prior to Visit  Medication Sig Dispense Refill   ALPRAZolam  (XANAX ) 1 MG tablet TAKE 1 TABLET BY MOUTH THREE TIMES A DAY AS NEEDED 90 tablet 2   ascorbic acid (VITAMIN C) 500 MG tablet Take 500 mg by mouth daily.     aspirin  EC 81 MG tablet Take 1 tablet (81 mg total) by mouth daily. Swallow whole. 100 tablet 11   atenolol  (TENORMIN ) 100 MG tablet TAKE 1 TABLET BY MOUTH DAILY. MUST KEEP SCHEDULE APPT W/NEW PROVIDER FOR FUTURE REFILLS 30 tablet 11   atorvastatin  (LIPITOR ) 80 MG tablet TAKE 1 TABLET BY MOUTH EVERY DAY 90 tablet 3   celecoxib  (CELEBREX ) 200 MG capsule Take 1 capsule (200 mg total) by mouth 2 (two) times daily. 180 capsule 3   Cholecalciferol (VITAMIN D3) 250 MCG (10000 UT) capsule Take 20,000 Units by mouth daily.     Coenzyme Q10 100 MG  capsule Take 100 mg by mouth daily.      cyanocobalamin  (VITAMIN B12) 500 MCG tablet Take 500 mcg by mouth daily.     fenofibrate  160 MG tablet TAKE 1 TABLET BY MOUTH EVERY DAY 30 tablet 11   icosapent  Ethyl (VASCEPA ) 1 g capsule Take 2 capsules (2 g total) by mouth 2 (two) times daily. 120 capsule 11   magnesium  gluconate (MAGONATE) 500 MG tablet Take 500 mg by mouth daily.     Multiple Vitamin (MULTIVITAMIN WITH MINERALS) TABS tablet Take 2 tablets by mouth daily.     omeprazole  (PRILOSEC) 40 MG capsule TAKE 1 CAPSULE BY MOUTH EVERY DAY 90 capsule 3   PRESCRIPTION MEDICATION Pt uses CPAP Machine     sildenafil  (VIAGRA ) 100 MG tablet Take 0.5-1 tablets (50-100 mg total) by mouth daily as needed for erectile dysfunction. 15 tablet 11   tamsulosin  (FLOMAX ) 0.4 MG CAPS capsule Take 1 capsule (0.4 mg total)  by mouth daily. 90 capsule 3   varenicline  (CHANTIX ) 1 MG tablet TAKE 1 TABLET BY MOUTH TWICE A DAY (Patient taking differently: Take 1 mg by mouth as needed.) 180 tablet 1   No current facility-administered medications on file prior to visit.        ROS:  All others reviewed and negative.  Objective        PE:  BP 138/84 (BP Location: Left Arm, Patient Position: Sitting, Cuff Size: Normal)   Pulse (!) 56   Temp 98.5 F (36.9 C) (Oral)   Ht 5' 9 (1.753 m)   Wt 258 lb (117 kg)   SpO2 97%   BMI 38.10 kg/m                 Constitutional: Pt appears in NAD               HENT: Head: NCAT.                Right Ear: External ear normal.                 Left Ear: External ear normal.                Eyes: . Pupils are equal, round, and reactive to light. Conjunctivae and EOM are normal               Nose: without d/c or deformity               Neck: Neck supple. Gross normal ROM               Cardiovascular: Normal rate and regular rhythm.                 Pulmonary/Chest: Effort normal and breath sounds without rales or wheezing.                Abd:  Soft, NT, ND, + BS, no organomegaly               Neurological: Pt is alert. At baseline orientation, motor grossly intact               Skin: Skin is warm. No rashes, no other new lesions, LE edema - trace bilateral to knees               Psychiatric: Pt behavior is normal without agitation   Micro: none  Cardiac tracings I have personally interpreted today:  none  Pertinent Radiological findings (summarize): none   Lab Results  Component Value Date   WBC 12.1 (H) 03/28/2024   HGB 13.6 03/28/2024   HCT 41.0 03/28/2024   PLT 232.0 03/28/2024   GLUCOSE 103 (H) 03/28/2024   CHOL 138 12/14/2023   TRIG 342.0 (H) 12/14/2023   HDL 22.80 (L) 12/14/2023   LDLDIRECT 82.0 06/14/2023   LDLCALC 46 12/14/2023   ALT 48 03/28/2024   AST 62 (H) 03/28/2024   NA 136 03/28/2024   K 4.0 03/28/2024   CL 99 03/28/2024   CREATININE 1.02  03/28/2024   BUN 14 03/28/2024   CO2 29 03/28/2024   TSH 4.19 12/14/2023   PSA 0.12 12/14/2023   INR 1.61 (H) 03/07/2011   HGBA1C 6.3 12/14/2023   MICROALBUR <0.7 12/14/2023   Assessment/Plan:  Tanner Gomez is a 58 y.o. White or Caucasian [1] male with  has a past medical history of Anxiety, Arthritis (1 year), Depression, GERD (gastroesophageal reflux disease), Hyperlipidemia, Hypertension, Pancreatitis, Sleep apnea (10/16/2018), and Tobacco abuse.  Peripheral edema Recent BNP and BMP normal, and overall improved swelling,  ok for lasix  20 mg bid prn only, cont off the amlodipine  10 mg  HTN (hypertension) BP Readings from Last 3 Encounters:  04/11/24 138/84  03/28/24 124/80  12/14/23 136/82   uncontrolled, pt to continue medical treatment tenormin  100 every day, also add diovan 160 every day, fu bp at home and next visit   Right shoulder pain Likely AC joint or rot cuff related - for xray, also refer sports medicine  Back pain Exam cw likely msk strain mild recurrent, for baclofen tid prn trial, to f/u any worsening symptoms or concerns  Followup: Return in about 9 weeks (around 06/13/2024).  Lynwood Rush, MD 04/11/2024 12:52 PM [facility]

## 2024-04-12 ENCOUNTER — Ambulatory Visit: Payer: Self-pay | Admitting: Internal Medicine

## 2024-04-29 ENCOUNTER — Ambulatory Visit: Admitting: Family Medicine

## 2024-04-29 ENCOUNTER — Other Ambulatory Visit: Payer: Self-pay

## 2024-04-29 VITALS — BP 106/60 | HR 60 | Ht 69.0 in | Wt 260.0 lb

## 2024-04-29 DIAGNOSIS — M25511 Pain in right shoulder: Secondary | ICD-10-CM | POA: Diagnosis not present

## 2024-04-29 NOTE — Patient Instructions (Addendum)
 Thank you for coming in today.   I've referred you to Physical Therapy here, at this office.  You will hear from our office soon about scheduling, once we check with your insurance company.   You received an injection today. Seek immediate medical attention if the joint becomes red, extremely painful, or is oozing fluid.   We can switch the physical therapy location to where ever you are working in [place] , just let us  know the location  *Reminder: Dr. Joane will be out of the office starting August 1st, for about 6 weeks

## 2024-04-29 NOTE — Progress Notes (Signed)
 I, Claretha Schimke am a scribe for Dr. Artist Lloyd, MD.  Tanner Gomez is a 58 y.o. male who presents to [facility] today for R shoulder pain x almost a year. Pt locates pain to posterior shoulder. Pain radiates down the arm. Arm gives out if he is reaching over head for too long.   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  Avascular necrosis.  Anxiety gout BPH   Exam:  BP 106/60   Pulse 60   Ht 5' 9 (1.753 m)   Wt 260 lb (117.9 kg)   BMI 38.40 kg/m  General: Well Developed, well nourished, and in no acute distress.   MSK: C-Spine: Normal appearing Normal cervical motion. Upper extremity strength is intact except noted below. Reflexes are intact.  Right shoulder normal-appearing normal motion pain with abduction. Strength reduced abduction 4/5.  Intact external and internal rotation. Positive empty can test, positive Hawkins and Neer's test. Negative Yergason's and speeds test.    Lab and Radiology Results  Procedure: Real-time Ultrasound Guided Injection of right shoulder subacromial bursa Device: Philips Affiniti 50G/GE Logiq Images permanently stored and available for review in PACS Verbal informed consent obtained.  Discussed risks and benefits of procedure. Warned about infection, bleeding, hyperglycemia damage to structures among others. Patient expresses understanding and agreement Time-out conducted.   Noted no overlying erythema, induration, or other signs of local infection.   Skin prepped in a sterile fashion.   Local anesthesia: Topical Ethyl chloride.   With sterile technique and under real time ultrasound guidance: 40 mg of Kenalog and 2 mL of Marcaine  injected into subacromial bursa. Fluid seen entering the bursa.   Completed without difficulty   Pain immediately resolved suggesting accurate placement of the medication.   Advised to call if fevers/chills, erythema, induration, drainage, or persistent bleeding.   Images permanently stored and available for review in the ultrasound unit.  Impression: Technically successful ultrasound guided injection.   EXAM: RIGHT SHOULDER - 2+ VIEW   COMPARISON:  None Available.   FINDINGS: No acute fracture or dislocation. Mild joint space loss of the AC joint. Soft tissues are unremarkable.   IMPRESSION: No acute fracture or dislocation.     Electronically Signed   By: Rogelia Myers M.D.   On: 04/11/2024 22:49   I, Artist Lloyd, personally (independently) visualized and performed the interpretation of the images attached in this note.       Assessment and Plan: 58 y.o. male with right shoulder pain due to subacromial impingement and bursitis.  Plan for injection and physical therapy. He may have a job in [place]  in a few weeks and we could change physical therapy to that location if needed.  If not improving consider recheck in 6 to 8 weeks either with myself or my partner.  If not better consider MRI.  PDMP not reviewed this encounter. Orders Placed This Encounter  Procedures   US  LIMITED JOINT SPACE STRUCTURES UP RIGHT(NO LINKED CHARGES)    Reason for Exam (SYMPTOM  OR DIAGNOSIS REQUIRED):   shoulder pain    Preferred imaging location?:   [facility] referral to Physical Therapy    Referral Priority:   Routine    Referral Type:  Physical Medicine    Referral Reason:   Specialty Services Required    Requested Specialty:   Physical Therapy    Number of Visits Requested:   1   No orders of the defined types were placed in this encounter.    Discussed warning signs or symptoms. Please see discharge instructions. Patient expresses  understanding.   The above documentation has been reviewed and is accurate and complete Artist Lloyd, M.D.

## 2024-05-03 ENCOUNTER — Other Ambulatory Visit: Payer: Self-pay | Admitting: Internal Medicine

## 2024-05-15 ENCOUNTER — Other Ambulatory Visit: Payer: Self-pay

## 2024-05-15 ENCOUNTER — Telehealth: Payer: Self-pay

## 2024-05-15 DIAGNOSIS — Z87891 Personal history of nicotine dependence: Secondary | ICD-10-CM

## 2024-05-15 DIAGNOSIS — Z122 Encounter for screening for malignant neoplasm of respiratory organs: Secondary | ICD-10-CM

## 2024-05-15 DIAGNOSIS — F17210 Nicotine dependence, cigarettes, uncomplicated: Secondary | ICD-10-CM

## 2024-05-15 NOTE — Telephone Encounter (Signed)
 Annual CT scheduled.

## 2024-05-16 ENCOUNTER — Encounter: Payer: Self-pay | Admitting: Physical Therapy

## 2024-05-16 ENCOUNTER — Ambulatory Visit: Admitting: Physical Therapy

## 2024-05-16 ENCOUNTER — Other Ambulatory Visit: Payer: Self-pay

## 2024-05-16 DIAGNOSIS — M25511 Pain in right shoulder: Secondary | ICD-10-CM

## 2024-05-16 DIAGNOSIS — G8929 Other chronic pain: Secondary | ICD-10-CM | POA: Diagnosis not present

## 2024-05-16 DIAGNOSIS — M6281 Muscle weakness (generalized): Secondary | ICD-10-CM | POA: Diagnosis not present

## 2024-05-16 NOTE — Therapy (Signed)
 OUTPATIENT PHYSICAL THERAPY EVALUATION   Patient Name: Tanner Gomez MRN: [id number] DOB:[birth year], 58 y.o., male Today's Date: 05/16/2024   END OF SESSION:  PT End of Session - 05/16/24 0923     Visit Number 1    Number of Visits 9    Date for PT Re-Evaluation 07/11/24    Authorization Type [employer]    PT Start Time 0930    PT Stop Time 1015    PT Time Calculation (min) 45 min    Activity Tolerance Patient tolerated treatment well    Behavior During Therapy [facility] for tasks assessed/performed          Past Medical History:  Diagnosis Date   Anxiety    Arthritis 1 year   Depression    GERD (gastroesophageal reflux disease)    Hyperlipidemia    Hypertension    Pancreatitis    Sleep apnea 10/16/2018   Tobacco abuse    Past Surgical History:  Procedure Laterality Date   EYE SURGERY     RIGHT (CHILDHOOD)   FRACTURE SURGERY     JOINT REPLACEMENT     LEFT HEART CATH AND CORONARY ANGIOGRAPHY N/A 07/30/2023   Procedure: LEFT HEART CATH AND CORONARY ANGIOGRAPHY;  Surgeon: [place], Peter M, MD;  Location: [facility];  Service: Cardiovascular;  Laterality: N/A;   Patient Active Problem List   Diagnosis Date Noted   Peripheral edema 04/11/2024   Right shoulder pain 04/11/2024   BPH associated with nocturia 12/16/2023   Bilateral leg pain 12/16/2023   AVN (avascular necrosis of bone) (HCC) 06/16/2023   Erectile dysfunction 06/16/2023   Coronary artery calcification seen on CT scan 06/14/2023   Left hip pain 12/14/2022   Cervical radiculitis 12/15/2021   B12 deficiency 12/13/2021   Hyponatremia 12/13/2021   Leukocytosis 12/13/2021   Elevated TSH 12/12/2020   Vitamin D  deficiency 12/12/2020   Facial cellulitis 08/10/2020   OSA (obstructive sleep apnea) 12/01/2019   Hyperglycemia 11/27/2018   Chest pain 12/15/2017   Back pain 12/15/2017   Encounter for well adult exam with abnormal findings 06/15/2017   Muscle cramping 06/14/2016   Pancreatitis    Tobacco abuse     GAD (generalized anxiety disorder) 07/16/2012   HTN (hypertension) 11/09/2011   Hypertriglyceridemia 11/09/2011   GERD (gastroesophageal reflux disease) 11/09/2011   LFT elevation 11/09/2011   Depression 11/09/2011   Gout 11/09/2011   Acute pancreatitis 11/09/2011    PCP: Norleen Lynwood LELON, MD  REFERRING PROVIDER: Joane Artist RAMAN, MD  REFERRING DIAG: Pain in joint of right shoulder  THERAPY DIAG:  Chronic right shoulder pain  Muscle weakness (generalized)  Rationale for Evaluation and Treatment: Rehabilitation  ONSET DATE: Chronic   SUBJECTIVE:         SUBJECTIVE STATEMENT: Patient reports right shoulder pain that has been going on for quite a while that has gotten worse. He reports his arm will go numb like he can't even use it, mainly the more he uses it the muscles just give. It happens quicker when he is doing stuff overhead. He gets a pain in the back of the right shoulder that shoots up to the neck, then his whole arm goes numb and limp. He did get a shot in the shoulder that helped for a couple of days. He denies any clicking/popping in his shoulder but he can feel cracking in his neck when he moves it. He has been working out with some dumbbells at the house but can't do much with  the right arm. The last week or so he has also been having bad cramps in his back, and for the past 3 months it's like he can't get enough sleep.   Hand dominance: Right  PERTINENT HISTORY: See PMH above  PAIN:  Are you having pain? Yes:  NPRS scale: 0/10 currently, 8/10 at worst Pain location: Right shoulder  Pain description: Sharp, shooting, numb Aggravating factors: Any activity involving the right arm Relieving factors: Rest  PRECAUTIONS: None  RED FLAGS: None   WEIGHT BEARING RESTRICTIONS: No  FALLS:  Has patient fallen in last 6 months? No  OCCUPATION: Personnel officer, superintendent  PLOF: Independent  PATIENT GOALS: Pain relief   OBJECTIVE:  Note: Objective measures were  completed at Evaluation unless otherwise noted. PATIENT SURVEYS:  PSFS: 1.5 Showering and shaving: 2 Working overhead: 1 Cleaning the house: 2 Washing, drying, folding clothes: 1  COGNITION: Overall cognitive status: Within functional limits for tasks assessed     SENSATION: Patient reports right hand sensation deficit compared to left  POSTURE: Rounded shoulder and forward head posture  CERVICAL ROM:  Cervical ROM grossly WFL and patient does not report any right arm symptoms  UPPER EXTREMITY ROM:   Active ROM Right eval Left eval  Shoulder flexion 150 150  Shoulder extension    Shoulder abduction 130 145  Shoulder adduction    Shoulder internal rotation L5 T12  Shoulder external rotation 60 / C6 70 / C7  Elbow flexion    Elbow extension    Wrist flexion    Wrist extension    Wrist ulnar deviation    Wrist radial deviation    Wrist pronation    Wrist supination    (Blank rows = not tested)  UPPER EXTREMITY MMT:  MMT Right eval Left eval  Shoulder flexion 4 5  Shoulder extension    Shoulder abduction 4+ 5  Shoulder adduction    Shoulder internal rotation 5 5  Shoulder external rotation 4 5  Middle trapezius    Lower trapezius    Elbow flexion    Elbow extension    Wrist flexion    Wrist extension    Wrist ulnar deviation    Wrist radial deviation    Wrist pronation    Wrist supination    Grip strength (lbs)    (Blank rows = not tested)  Patient reports right shoulder pain with flexion and ER muscle testing  SHOULDER SPECIAL TESTS: Spurling's on right did elicit some right shoulder pain but denies any change in numbness sensation  JOINT MOBILITY TESTING:  Right shoulder hypomobility  PALPATION:  Tender to palpation right upper trap and levator scap region, posterior cuff and deltoid                                                                                                                             TREATMENT [facility] Adult PT Treatment:  DATE: 05/16/2024 Shoulder ER with yellow x 10 Standing scaption with yellow x 10 Row with black x 10  PATIENT EDUCATION: Education details: Exam findings, POC, HEP Person educated: Patient Education method: Explanation, Demonstration, Tactile cues, Verbal cues, and Handouts Education comprehension: verbalized understanding, returned demonstration, verbal cues required, tactile cues required, and needs further education  HOME EXERCISE PROGRAM: Access Code: [email or web address]    ASSESSMENT: CLINICAL IMPRESSION: Patient is a 58 y.o. male who was seen today for physical therapy evaluation and treatment for chronic right shoulder pain. His symptoms seem most consistent with rotator cuff related pain with a likely cervical radicular component due to the right hand and arm numbness. He does exhibit some limitations with his right shoulder motion and rotator cuff strength with pain while testing, tenderness of the right cervical and posterior cuff region with hypomobility of the right shoulder, and report of sensation deficit on the right.   OBJECTIVE IMPAIRMENTS: decreased activity tolerance, decreased ROM, decreased strength, postural dysfunction, and pain.   ACTIVITY LIMITATIONS: carrying, lifting, bathing, reach over head, and hygiene/grooming  PARTICIPATION LIMITATIONS: meal prep, cleaning, community activity, occupation, and yard work  PERSONAL FACTORS: Fitness, Past/current experiences, and Time since onset of injury/illness/exacerbation are also affecting patient's functional outcome.   REHAB POTENTIAL: Good  CLINICAL DECISION MAKING: Stable/uncomplicated  EVALUATION COMPLEXITY: Low   GOALS: Goals reviewed with patient? Yes  SHORT TERM GOALS: Target date: 06/13/2024  Patient will be I with initial HEP in order to progress with therapy. Baseline: HEP provided at eval Goal status: INITIAL  2.  Patient will report right shoulder pain </= 5/10 in  order to reduce functional limitations Baseline: 8/10 Goal status: INITIAL  3.  Patient will report 50% improvement in right hand/arm numbness to reduce functional limitations Baseline: patient reports numbness with all overhead activity Goal status: INITIAL  LONG TERM GOALS: Target date: 07/11/2024  Patient will be I with final HEP to maintain progress from PT. Baseline: HEP provided at eval Goal status: INITIAL  2.  Patient will report PSFS >/= 5 in order to indicate improvement in their functional ability. Baseline: 1.5 Goal status: INITIAL  3.  Patient will demonstrate right shoulder strength 5/5 MMT in order to improve tolerance for overhead activity Baseline: see strength deficits above Goal status: INITIAL  4.  Patient will report right shoulder pain </= 2/10 with all activity in order to reduce functional limitations Baseline: 8/10 Goal status: INITIAL   PLAN: PT FREQUENCY: 1x/week  PT DURATION: 8 weeks  PLANNED INTERVENTIONS: 97164- PT Re-evaluation, 97750- Physical Performance Testing, 97110-Therapeutic exercises, 97530- Therapeutic activity, 97112- Neuromuscular re-education, 97535- Self Care, 02859- Manual therapy, 20560 (1-2 muscles), 20561 (3+ muscles)- Dry Needling, Patient/Family education, Taping, Joint mobilization, Joint manipulation, Spinal manipulation, Spinal mobilization, Cryotherapy, and Moist heat  PLAN FOR NEXT SESSION: Review HEP and progress PRN, assess cervical mobility and neurodynamics, manual/TPDN for right cervical and shoulder region, progress rotator cuff and periscapular strengthening, progress overhead activity   Elaine Daring, PT, DPT, LAT, ATC 05/16/24  12:47 PM Phone: [phone] Fax: [phone]

## 2024-05-16 NOTE — Patient Instructions (Signed)
 Access Code: [email or web address] URL: [email or web address] Date: 05/16/2024 Prepared by: Elaine Daring  Exercises - Shoulder External Rotation with Anchored Resistance  - 1 x daily - 3 sets - 10 reps - Single Arm Scaption with Resistance  - 1 x daily - 3 sets - 10 reps - Standing Row with Anchored Resistance  - 1 x daily - 3 sets - 10 reps

## 2024-05-30 ENCOUNTER — Ambulatory Visit (HOSPITAL_BASED_OUTPATIENT_CLINIC_OR_DEPARTMENT_OTHER)
Admission: RE | Admit: 2024-05-30 | Discharge: 2024-05-30 | Disposition: A | Discharge status: Home / Self Care | Source: Ambulatory Visit | Attending: Internal Medicine | Admitting: Internal Medicine

## 2024-05-30 DIAGNOSIS — F17210 Nicotine dependence, cigarettes, uncomplicated: Secondary | ICD-10-CM | POA: Diagnosis present

## 2024-05-30 DIAGNOSIS — Z87891 Personal history of nicotine dependence: Secondary | ICD-10-CM | POA: Insufficient documentation

## 2024-05-30 DIAGNOSIS — Z122 Encounter for screening for malignant neoplasm of respiratory organs: Secondary | ICD-10-CM | POA: Diagnosis present

## 2024-06-04 NOTE — Therapy (Signed)
 OUTPATIENT PHYSICAL THERAPY TREATMENT   Patient Name: Tanner Gomez MRN: [id number] DOB:[birth year], 57 y.o., male Today's Date: 06/06/2024   END OF SESSION:  PT End of Session - 06/06/24 1110     Visit Number 2    Number of Visits 9    Date for PT Re-Evaluation 07/11/24    Authorization Type [employer]    PT Start Time 1105    PT Stop Time 1145    PT Time Calculation (min) 40 min    Activity Tolerance Patient tolerated treatment well    Behavior During Therapy [facility] for tasks assessed/performed           Past Medical History:  Diagnosis Date   Anxiety    Arthritis 1 year   Depression    GERD (gastroesophageal reflux disease)    Hyperlipidemia    Hypertension    Pancreatitis    Sleep apnea 10/16/2018   Tobacco abuse    Past Surgical History:  Procedure Laterality Date   EYE SURGERY     RIGHT (CHILDHOOD)   FRACTURE SURGERY     JOINT REPLACEMENT     LEFT HEART CATH AND CORONARY ANGIOGRAPHY N/A 07/30/2023   Procedure: LEFT HEART CATH AND CORONARY ANGIOGRAPHY;  Surgeon: [place], Peter M, MD;  Location: [facility];  Service: Cardiovascular;  Laterality: N/A;   Patient Active Problem List   Diagnosis Date Noted   Peripheral edema 04/11/2024   Right shoulder pain 04/11/2024   BPH associated with nocturia 12/16/2023   Bilateral leg pain 12/16/2023   AVN (avascular necrosis of bone) (HCC) 06/16/2023   Erectile dysfunction 06/16/2023   Coronary artery calcification seen on CT scan 06/14/2023   Left hip pain 12/14/2022   Cervical radiculitis 12/15/2021   B12 deficiency 12/13/2021   Hyponatremia 12/13/2021   Leukocytosis 12/13/2021   Elevated TSH 12/12/2020   Vitamin D  deficiency 12/12/2020   Facial cellulitis 08/10/2020   OSA (obstructive sleep apnea) 12/01/2019   Hyperglycemia 11/27/2018   Chest pain 12/15/2017   Back pain 12/15/2017   Encounter for well adult exam with abnormal findings 06/15/2017   Muscle cramping 06/14/2016   Pancreatitis    Tobacco abuse     GAD (generalized anxiety disorder) 07/16/2012   HTN (hypertension) 11/09/2011   Hypertriglyceridemia 11/09/2011   GERD (gastroesophageal reflux disease) 11/09/2011   LFT elevation 11/09/2011   Depression 11/09/2011   Gout 11/09/2011   Acute pancreatitis 11/09/2011    PCP: Norleen Lynwood LELON, MD  REFERRING PROVIDER: Joane Artist RAMAN, MD  REFERRING DIAG: Pain in joint of right shoulder  THERAPY DIAG:  Chronic right shoulder pain  Muscle weakness (generalized)  Rationale for Evaluation and Treatment: Rehabilitation  ONSET DATE: Chronic   SUBJECTIVE:         SUBJECTIVE STATEMENT: Patient reports the right shoulder is a tad bit better. Shoulder is a little fatigued this morning.  Eval: Patient reports right shoulder pain that has been going on for quite a while that has gotten worse. He reports his arm will go numb like he can't even use it, mainly the more he uses it the muscles just give. It happens quicker when he is doing stuff overhead. He gets a pain in the back of the right shoulder that shoots up to the neck, then his whole arm goes numb and limp. He did get a shot in the shoulder that helped for a couple of days. He denies any clicking/popping in his shoulder but he can feel cracking in his neck  when he moves it. He has been working out with some dumbbells at the house but can't do much with the right arm. The last week or so he has also been having bad cramps in his back, and for the past 3 months it's like he can't get enough sleep.   Hand dominance: Right  PERTINENT HISTORY: See PMH above  PAIN:  Are you having pain? Yes:  NPRS scale: 0/10 currently, 8/10 at worst Pain location: Right shoulder  Pain description: Sharp, shooting, numb Aggravating factors: Any activity involving the right arm Relieving factors: Rest  PRECAUTIONS: None  PATIENT GOALS: Pain relief   OBJECTIVE:  Note: Objective measures were completed at Evaluation unless otherwise noted. PATIENT  SURVEYS:  PSFS: 1.5 Showering and shaving: 2 Working overhead: 1 Cleaning the house: 2 Washing, drying, folding clothes: 1     SENSATION: Patient reports right hand sensation deficit compared to left  POSTURE: Rounded shoulder and forward head posture  CERVICAL ROM:  Cervical ROM grossly WFL and patient does not report any right arm symptoms  UPPER EXTREMITY ROM:   Active ROM Right eval Left eval Rt 8/22/205  Shoulder flexion 150 150   Shoulder extension     Shoulder abduction 130 145 145  Shoulder adduction     Shoulder internal rotation L5 T12 L2  Shoulder external rotation 60 / C6 70 / C7   Elbow flexion     Elbow extension     Wrist flexion     Wrist extension     Wrist ulnar deviation     Wrist radial deviation     Wrist pronation     Wrist supination     (Blank rows = not tested)  UPPER EXTREMITY MMT:  MMT Right eval Left eval  Shoulder flexion 4 5  Shoulder extension    Shoulder abduction 4+ 5  Shoulder adduction    Shoulder internal rotation 5 5  Shoulder external rotation 4 5  Middle trapezius    Lower trapezius    Elbow flexion    Elbow extension    Wrist flexion    Wrist extension    Wrist ulnar deviation    Wrist radial deviation    Wrist pronation    Wrist supination    Grip strength (lbs)    (Blank rows = not tested)  Patient reports right shoulder pain with flexion and ER muscle testing  SHOULDER SPECIAL TESTS: Spurling's on right did elicit some right shoulder pain but denies any change in numbness sensation  JOINT MOBILITY TESTING:  Right shoulder hypomobility  PALPATION:  Tender to palpation right upper trap and levator scap region, posterior cuff and deltoid                                                                                                                             TREATMENT [facility] Adult PT Treatment:  DATE: 06/06/2024 UBE L3 x 4 min (fwd/bwd) to improve endurance  and workload capacity Right GHJ mobs primarily inferior and posterior at various ranges of elevation Right shoulder PROM all directions Supine dowel shoulder flexion 2 x 10 Standing wall slide for shoulder elevation x 10 Standing shoulder scaption with 3# x 10, 4# 2 x 10 Bent over row supported on table with 15# 3 x 10 Sidelying shoulder ER with 3# 3 x10  PATIENT EDUCATION: Education details: HEP Person educated: Patient Education method: Programmer, multimedia, Demonstration, Actor cues, Verbal cues Education comprehension: verbalized understanding, returned demonstration, verbal cues required, tactile cues required, and needs further education  HOME EXERCISE PROGRAM: Access Code: [email or web address]    ASSESSMENT: CLINICAL IMPRESSION: Patient tolerated therapy well with no adverse effects. Therapy focused on improve right shoulder mobility and progression of his shoulder strengthening. He does exhibit improved motion of the right shoulder this visit but does report majority of tension in the shoulder when reach behind his back. No changes made to his HEP this visit. Patient would benefit from continued skilled PT to progress mobility and strength in order to reduce pain and maximize functional ability.   Eval: Patient is a 58 y.o. male who was seen today for physical therapy evaluation and treatment for chronic right shoulder pain. His symptoms seem most consistent with rotator cuff related pain with a likely cervical radicular component due to the right hand and arm numbness. He does exhibit some limitations with his right shoulder motion and rotator cuff strength with pain while testing, tenderness of the right cervical and posterior cuff region with hypomobility of the right shoulder, and report of sensation deficit on the right.   OBJECTIVE IMPAIRMENTS: decreased activity tolerance, decreased ROM, decreased strength, postural dysfunction, and pain.   ACTIVITY LIMITATIONS: carrying, lifting, bathing,  reach over head, and hygiene/grooming  PARTICIPATION LIMITATIONS: meal prep, cleaning, community activity, occupation, and yard work  PERSONAL FACTORS: Fitness, Past/current experiences, and Time since onset of injury/illness/exacerbation are also affecting patient's functional outcome.    GOALS: Goals reviewed with patient? Yes  SHORT TERM GOALS: Target date: 06/13/2024  Patient will be I with initial HEP in order to progress with therapy. Baseline: HEP provided at eval Goal status: INITIAL  2.  Patient will report right shoulder pain </= 5/10 in order to reduce functional limitations Baseline: 8/10 Goal status: INITIAL  3.  Patient will report 50% improvement in right hand/arm numbness to reduce functional limitations Baseline: patient reports numbness with all overhead activity Goal status: INITIAL  LONG TERM GOALS: Target date: 07/11/2024  Patient will be I with final HEP to maintain progress from PT. Baseline: HEP provided at eval Goal status: INITIAL  2.  Patient will report PSFS >/= 5 in order to indicate improvement in their functional ability. Baseline: 1.5 Goal status: INITIAL  3.  Patient will demonstrate right shoulder strength 5/5 MMT in order to improve tolerance for overhead activity Baseline: see strength deficits above Goal status: INITIAL  4.  Patient will report right shoulder pain </= 2/10 with all activity in order to reduce functional limitations Baseline: 8/10 Goal status: INITIAL   PLAN: PT FREQUENCY: 1x/week  PT DURATION: 8 weeks  PLANNED INTERVENTIONS: 97164- PT Re-evaluation, 97750- Physical Performance Testing, 97110-Therapeutic exercises, 97530- Therapeutic activity, 97112- Neuromuscular re-education, 97535- Self Care, 02859- Manual therapy, 20560 (1-2 muscles), 20561 (3+ muscles)- Dry Needling, Patient/Family education, Taping, Joint mobilization, Joint manipulation, Spinal manipulation, Spinal mobilization, Cryotherapy, and Moist  heat  PLAN FOR NEXT SESSION: Review HEP  and progress PRN, assess cervical mobility and neurodynamics, manual/TPDN for right cervical and shoulder region, progress rotator cuff and periscapular strengthening, progress overhead activity   Elaine Daring, PT, DPT, LAT, ATC 06/06/24  11:49 AM Phone: [phone] Fax: [phone]

## 2024-06-06 ENCOUNTER — Other Ambulatory Visit: Payer: Self-pay

## 2024-06-06 ENCOUNTER — Encounter: Payer: Self-pay | Admitting: Physical Therapy

## 2024-06-06 ENCOUNTER — Ambulatory Visit (INDEPENDENT_AMBULATORY_CARE_PROVIDER_SITE_OTHER): Admitting: Physical Therapy

## 2024-06-06 DIAGNOSIS — M25511 Pain in right shoulder: Secondary | ICD-10-CM

## 2024-06-06 DIAGNOSIS — M6281 Muscle weakness (generalized): Secondary | ICD-10-CM

## 2024-06-06 DIAGNOSIS — G8929 Other chronic pain: Secondary | ICD-10-CM

## 2024-06-13 ENCOUNTER — Ambulatory Visit: Payer: Commercial Managed Care - PPO | Admitting: Internal Medicine

## 2024-06-13 ENCOUNTER — Encounter: Payer: Self-pay | Admitting: Internal Medicine

## 2024-06-13 ENCOUNTER — Encounter: Payer: Self-pay | Admitting: Physical Therapy

## 2024-06-13 ENCOUNTER — Ambulatory Visit (INDEPENDENT_AMBULATORY_CARE_PROVIDER_SITE_OTHER): Admitting: Physical Therapy

## 2024-06-13 ENCOUNTER — Ambulatory Visit: Payer: Self-pay | Admitting: Internal Medicine

## 2024-06-13 ENCOUNTER — Other Ambulatory Visit: Payer: Self-pay

## 2024-06-13 VITALS — BP 126/78 | HR 61 | Temp 98.1°F | Ht 69.0 in | Wt 250.0 lb

## 2024-06-13 DIAGNOSIS — I10 Essential (primary) hypertension: Secondary | ICD-10-CM | POA: Diagnosis not present

## 2024-06-13 DIAGNOSIS — Z720 Tobacco use: Secondary | ICD-10-CM | POA: Diagnosis not present

## 2024-06-13 DIAGNOSIS — I2510 Atherosclerotic heart disease of native coronary artery without angina pectoris: Secondary | ICD-10-CM

## 2024-06-13 DIAGNOSIS — E559 Vitamin D deficiency, unspecified: Secondary | ICD-10-CM | POA: Diagnosis not present

## 2024-06-13 DIAGNOSIS — G8929 Other chronic pain: Secondary | ICD-10-CM

## 2024-06-13 DIAGNOSIS — M25511 Pain in right shoulder: Secondary | ICD-10-CM | POA: Diagnosis not present

## 2024-06-13 DIAGNOSIS — R600 Localized edema: Secondary | ICD-10-CM

## 2024-06-13 DIAGNOSIS — E538 Deficiency of other specified B group vitamins: Secondary | ICD-10-CM | POA: Diagnosis not present

## 2024-06-13 DIAGNOSIS — M6281 Muscle weakness (generalized): Secondary | ICD-10-CM

## 2024-06-13 DIAGNOSIS — E781 Pure hyperglyceridemia: Secondary | ICD-10-CM

## 2024-06-13 DIAGNOSIS — R42 Dizziness and giddiness: Secondary | ICD-10-CM | POA: Diagnosis not present

## 2024-06-13 DIAGNOSIS — J309 Allergic rhinitis, unspecified: Secondary | ICD-10-CM

## 2024-06-13 DIAGNOSIS — R739 Hyperglycemia, unspecified: Secondary | ICD-10-CM | POA: Diagnosis not present

## 2024-06-13 LAB — BASIC METABOLIC PANEL WITH GFR
BUN: 16 mg/dL (ref 6–23)
CO2: 26 meq/L (ref 19–32)
Calcium: 9.9 mg/dL (ref 8.4–10.5)
Chloride: 100 meq/L (ref 96–112)
Creatinine, Ser: 1.06 mg/dL (ref 0.40–1.50)
GFR: 77.19 mL/min (ref >60.00)
Glucose, Bld: 103 mg/dL — ABNORMAL HIGH (ref 70–99)
Potassium: 4.7 meq/L (ref 3.5–5.1)
Sodium: 136 meq/L (ref 135–145)

## 2024-06-13 LAB — HEPATIC FUNCTION PANEL
ALT: 36 U/L (ref 0–53)
AST: 50 U/L — ABNORMAL HIGH (ref 0–37)
Albumin: 4.2 g/dL (ref 3.5–5.2)
Alkaline Phosphatase: 82 U/L (ref 39–117)
Bilirubin, Direct: 0.2 mg/dL (ref 0.0–0.3)
Total Bilirubin: 0.5 mg/dL (ref 0.2–1.2)
Total Protein: 8.8 g/dL — ABNORMAL HIGH (ref 6.0–8.3)

## 2024-06-13 LAB — LIPID PANEL
Cholesterol: 126 mg/dL (ref 0–200)
HDL: 28.6 mg/dL — ABNORMAL LOW (ref >39.00)
LDL Cholesterol: 67 mg/dL (ref 0–99)
NonHDL: 97.29
Total CHOL/HDL Ratio: 4
Triglycerides: 149 mg/dL (ref 0.0–149.0)
VLDL: 29.8 mg/dL (ref 0.0–40.0)

## 2024-06-13 LAB — VITAMIN D 25 HYDROXY (VIT D DEFICIENCY, FRACTURES): VITD: 82.68 ng/mL (ref 30.00–100.00)

## 2024-06-13 LAB — HEMOGLOBIN A1C: Hgb A1c MFr Bld: 6.9 % — ABNORMAL HIGH (ref 4.6–6.5)

## 2024-06-13 MED ORDER — MECLIZINE HCL 12.5 MG PO TABS: 12.5000 mg | ORAL_TABLET | Freq: Three times a day (TID) | ORAL | 1 refills | Status: DC | PRN

## 2024-06-13 NOTE — Patient Instructions (Addendum)
 Please have your Shingrix (shingles) shots done at your local pharmacy.  Please take all new medication as prescribed - the meclizine  as needed for vertigo  Please continue all other medications as before, and refills have been done if requested.  Please have the pharmacy call with any other refills you may need.  Please continue your efforts at being more active, low cholesterol diet, and weight control.  Please keep your appointments with your specialists as you may have planned  You will be contacted regarding the referral for: cardiology  Please go to the LAB at the blood drawing area for the tests to be done  You will be contacted by phone if any changes need to be made immediately.  Otherwise, you will receive a letter about your results with an explanation, but please check with MyChart first.  Please make an Appointment to return in 6 months, or sooner if needed

## 2024-06-13 NOTE — Therapy (Signed)
 OUTPATIENT PHYSICAL THERAPY TREATMENT   Patient Name: Tanner Gomez MRN: [id number] DOB:1965/12/22, 58 y.o., male Today's Date: 06/13/2024   END OF SESSION:  PT End of Session - 06/13/24 0939     Visit Number 3    Number of Visits 9    Date for PT Re-Evaluation 07/11/24    Authorization Type [employer]    PT Start Time [phone]    PT Stop Time 1015    PT Time Calculation (min) 38 min    Activity Tolerance Patient tolerated treatment well    Behavior During Therapy [facility] for tasks assessed/performed            Past Medical History:  Diagnosis Date   Anxiety    Arthritis 1 year   Depression    GERD (gastroesophageal reflux disease)    Hyperlipidemia    Hypertension    Pancreatitis    Sleep apnea 10/16/2018   Tobacco abuse    Past Surgical History:  Procedure Laterality Date   EYE SURGERY     RIGHT (CHILDHOOD)   FRACTURE SURGERY     JOINT REPLACEMENT     LEFT HEART CATH AND CORONARY ANGIOGRAPHY N/A 07/30/2023   Procedure: LEFT HEART CATH AND CORONARY ANGIOGRAPHY;  Surgeon: [place], Peter M, MD;  Location: [facility];  Service: Cardiovascular;  Laterality: N/A;   Patient Active Problem List   Diagnosis Date Noted   Peripheral edema 04/11/2024   Right shoulder pain 04/11/2024   BPH associated with nocturia 12/16/2023   Bilateral leg pain 12/16/2023   AVN (avascular necrosis of bone) (HCC) 06/16/2023   Erectile dysfunction 06/16/2023   Coronary artery calcification seen on CT scan 06/14/2023   Left hip pain 12/14/2022   Cervical radiculitis 12/15/2021   B12 deficiency 12/13/2021   Hyponatremia 12/13/2021   Leukocytosis 12/13/2021   Elevated TSH 12/12/2020   Vitamin D  deficiency 12/12/2020   Facial cellulitis 08/10/2020   OSA (obstructive sleep apnea) 12/01/2019   Hyperglycemia 11/27/2018   Chest pain 12/15/2017   Back pain 12/15/2017   Encounter for well adult exam with abnormal findings 06/15/2017   Muscle cramping 06/14/2016   Pancreatitis    Tobacco abuse     GAD (generalized anxiety disorder) 07/16/2012   HTN (hypertension) 11/09/2011   Hypertriglyceridemia 11/09/2011   GERD (gastroesophageal reflux disease) 11/09/2011   LFT elevation 11/09/2011   Depression 11/09/2011   Gout 11/09/2011   Acute pancreatitis 11/09/2011    PCP: Norleen Lynwood LELON, MD  REFERRING PROVIDER: Joane Artist RAMAN, MD  REFERRING DIAG: Pain in joint of right shoulder  THERAPY DIAG:  Chronic right shoulder pain  Muscle weakness (generalized)  Rationale for Evaluation and Treatment: Rehabilitation  ONSET DATE: Chronic   SUBJECTIVE:         SUBJECTIVE STATEMENT: Patient reports he had a rough night last night. He does report other than last night it seems like his shoulder seems to be getting better and he feels better during the day. The last set of the exercises is still challenging.   Eval: Patient reports right shoulder pain that has been going on for quite a while that has gotten worse. He reports his arm will go numb like he can't even use it, mainly the more he uses it the muscles just give. It happens quicker when he is doing stuff overhead. He gets a pain in the back of the right shoulder that shoots up to the neck, then his whole arm goes numb and limp. He did get  a shot in the shoulder that helped for a couple of days. He denies any clicking/popping in his shoulder but he can feel cracking in his neck when he moves it. He has been working out with some dumbbells at the house but can't do much with the right arm. The last week or so he has also been having bad cramps in his back, and for the past 3 months it's like he can't get enough sleep.   Hand dominance: Right  PERTINENT HISTORY: See PMH above  PAIN:  Are you having pain? Yes:  NPRS scale: 6/10 currently, 8/10 at worst Pain location: Right shoulder  Pain description: Sharp, shooting, numb Aggravating factors: Any activity involving the right arm Relieving factors: Rest  PRECAUTIONS: None  PATIENT  GOALS: Pain relief   OBJECTIVE:  Note: Objective measures were completed at Evaluation unless otherwise noted. PATIENT SURVEYS:  PSFS: 1.5 Showering and shaving: 2 Working overhead: 1 Cleaning the house: 2 Washing, drying, folding clothes: 1     SENSATION: Patient reports right hand sensation deficit compared to left  POSTURE: Rounded shoulder and forward head posture  CERVICAL ROM:  Cervical ROM grossly WFL and patient does not report any right arm symptoms  UPPER EXTREMITY ROM:   Active ROM Right eval Left eval Rt 8/22/205 Rt 06/13/2024  Shoulder flexion 150 150  150  Shoulder extension      Shoulder abduction 130 145 145 150  Shoulder adduction      Shoulder internal rotation L5 T12 L2 L1  Shoulder external rotation 60 / C6 70 / C7    Elbow flexion      Elbow extension      Wrist flexion      Wrist extension      Wrist ulnar deviation      Wrist radial deviation      Wrist pronation      Wrist supination      (Blank rows = not tested)  UPPER EXTREMITY MMT:  MMT Right eval Left eval  Shoulder flexion 4 5  Shoulder extension    Shoulder abduction 4+ 5  Shoulder adduction    Shoulder internal rotation 5 5  Shoulder external rotation 4 5  Middle trapezius    Lower trapezius    Elbow flexion    Elbow extension    Wrist flexion    Wrist extension    Wrist ulnar deviation    Wrist radial deviation    Wrist pronation    Wrist supination    Grip strength (lbs)    (Blank rows = not tested)  Patient reports right shoulder pain with flexion and ER muscle testing  SHOULDER SPECIAL TESTS: Spurling's on right did elicit some right shoulder pain but denies any change in numbness sensation  JOINT MOBILITY TESTING:  Right shoulder hypomobility  PALPATION:  Tender to palpation right upper trap and levator scap region, posterior cuff and deltoid  TREATMENT [facility] Adult PT Treatment:                                                DATE: 06/13/2024 UBE L3 x 5 min (fwd/bwd) to improve endurance and workload capacity Right GHJ mobs primarily inferior and posterior at various ranges of elevation Right shoulder PROM all directions Standing wall slide for shoulder elevation x 10 Standing shoulder scaption with 4# 2 x 10 Wall ball circles with stability ball 2 x 20 cw/ccw Supine horizontal abduction with green 2 x 10 Supine serratus punch with 10# 2 x 10  PATIENT EDUCATION: Education details: HEP Person educated: Patient Education method: Programmer, multimedia, Demonstration, Actor cues, Verbal cues Education comprehension: verbalized understanding, returned demonstration, verbal cues required, tactile cues required, and needs further education  HOME EXERCISE PROGRAM: Access Code: [email or web address]    ASSESSMENT: CLINICAL IMPRESSION: Patient tolerated therapy well with no adverse effects. Therapy was slightly shortened due to patient having another appointment this morning. Therapy continues to focus on improving his right shoulder mobility and progression of his shoulder strengthening. He does exhibit continued improvement in his right shoulder movement reaching his back. He did report muscular burn and fatigue with the wall ball circles. Progressed more periscapular strengthening with visit with good tolerance. No changes made to his HEP this visit. Patient would benefit from continued skilled PT to progress mobility and strength in order to reduce pain and maximize functional ability.   Eval: Patient is a 58 y.o. male who was seen today for physical therapy evaluation and treatment for chronic right shoulder pain. His symptoms seem most consistent with rotator cuff related pain with a likely cervical radicular component due to the right hand and arm numbness. He does exhibit some limitations with his right shoulder motion and rotator cuff  strength with pain while testing, tenderness of the right cervical and posterior cuff region with hypomobility of the right shoulder, and report of sensation deficit on the right.   OBJECTIVE IMPAIRMENTS: decreased activity tolerance, decreased ROM, decreased strength, postural dysfunction, and pain.   ACTIVITY LIMITATIONS: carrying, lifting, bathing, reach over head, and hygiene/grooming  PARTICIPATION LIMITATIONS: meal prep, cleaning, community activity, occupation, and yard work  PERSONAL FACTORS: Fitness, Past/current experiences, and Time since onset of injury/illness/exacerbation are also affecting patient's functional outcome.    GOALS: Goals reviewed with patient? Yes  SHORT TERM GOALS: Target date: 06/13/2024  Patient will be I with initial HEP in order to progress with therapy. Baseline: HEP provided at eval 06/13/2024: independent with initial HEP Goal status: MET  2.  Patient will report right shoulder pain </= 5/10 in order to reduce functional limitations Baseline: 8/10 06/13/2024: continues to report right shoulder pain Goal status: ONGOING  3.  Patient will report 50% improvement in right hand/arm numbness to reduce functional limitations Baseline: patient reports numbness with all overhead activity 06/13/2024: patient report improvement in right hand numbness with activity Goal status: MET  LONG TERM GOALS: Target date: 07/11/2024  Patient will be I with final HEP to maintain progress from PT. Baseline: HEP provided at eval Goal status: INITIAL  2.  Patient will report PSFS >/= 5 in order to indicate improvement in their functional ability. Baseline: 1.5 Goal status: INITIAL  3.  Patient will demonstrate right shoulder strength 5/5 MMT in order to improve tolerance for overhead activity Baseline: see strength deficits  above Goal status: INITIAL  4.  Patient will report right shoulder pain </= 2/10 with all activity in order to reduce functional  limitations Baseline: 8/10 Goal status: INITIAL   PLAN: PT FREQUENCY: 1x/week  PT DURATION: 8 weeks  PLANNED INTERVENTIONS: 97164- PT Re-evaluation, 97750- Physical Performance Testing, 97110-Therapeutic exercises, 97530- Therapeutic activity, 97112- Neuromuscular re-education, 97535- Self Care, 02859- Manual therapy, 20560 (1-2 muscles), 20561 (3+ muscles)- Dry Needling, Patient/Family education, Taping, Joint mobilization, Joint manipulation, Spinal manipulation, Spinal mobilization, Cryotherapy, and Moist heat  PLAN FOR NEXT SESSION: Review HEP and progress PRN, assess cervical mobility and neurodynamics, manual/TPDN for right cervical and shoulder region, progress rotator cuff and periscapular strengthening, progress overhead activity   Elaine Daring, PT, DPT, LAT, ATC 06/13/24  10:15 AM Phone: [phone] Fax: [phone]

## 2024-06-13 NOTE — Progress Notes (Signed)
 Patient ID: BLU LORI, male   DOB: [birth year], 58 y.o.   MRN: [id number]        Chief Complaint: follow up HTN, HLD and hyperglycemia  ,smoker, CAD by CT score, vertigo, low b12, low D       HPI:  Tanner Gomez is a 58 y.o. male here with c/o recurring vertigo with head movement x 2 days last wk without recurrence, and did have some nausea mild as well. .  Does have several wks ongoing nasal allergy symptoms with clearish congestion, itch and sneezing, without fever, pain, ST, cough, swelling or wheezing.  Pt denies chest pain, increased sob or doe, wheezing, orthopnea, PND, increased LE swelling, palpitations, dizziness or syncope.  Pt denies polydipsia, polyuria, or new focal neuro s/s.    Pt denies fever, night sweats, loss of appetite, or other constitutional symptoms  Lost wt 10 lbs with lower carb diet. Still smoking, not ready to quit.  His cardiologist is moving with his wife to new job, so he asks for different cardiology referral.  Has not been taking lasix  for over 3 wks, and no recurrence of leg swelling.    Wt Readings from Last 3 Encounters:  06/13/24 250 lb (113.4 kg)  04/29/24 260 lb (117.9 kg)  04/11/24 258 lb (117 kg)   BP Readings from Last 3 Encounters:  06/13/24 126/78  04/29/24 106/60  04/11/24 138/84         Past Medical History:  Diagnosis Date   Anxiety    Arthritis 1 year   Depression    GERD (gastroesophageal reflux disease)    Hyperlipidemia    Hypertension    Pancreatitis    Sleep apnea 10/16/2018   Tobacco abuse    Past Surgical History:  Procedure Laterality Date   EYE SURGERY     RIGHT (CHILDHOOD)   FRACTURE SURGERY     JOINT REPLACEMENT     LEFT HEART CATH AND CORONARY ANGIOGRAPHY N/A 07/30/2023   Procedure: LEFT HEART CATH AND CORONARY ANGIOGRAPHY;  Surgeon: [place], Peter M, MD;  Location: [facility];  Service: Cardiovascular;  Laterality: N/A;    reports that he has been smoking cigarettes. He has a 74 pack-year smoking history. He has  never used smokeless tobacco. He reports current alcohol use of about 33.0 standard drinks of alcohol per week. He reports that he does not use drugs. family history includes Diabetes in his father; Heart attack in his maternal grandmother, mother, and paternal grandfather; Heart disease in his mother; Hypertension in his father and mother; Kidney Stones in his sister; Kidney disease in his mother. Allergies  Allergen Reactions   Sular Swelling   Current Outpatient Medications on File Prior to Visit  Medication Sig Dispense Refill   ALPRAZolam  (XANAX ) 1 MG tablet TAKE 1 TABLET BY MOUTH THREE TIMES A DAY AS NEEDED 90 tablet 2   ascorbic acid (VITAMIN C) 500 MG tablet Take 500 mg by mouth daily.     aspirin  EC 81 MG tablet Take 1 tablet (81 mg total) by mouth daily. Swallow whole. 100 tablet 11   atenolol  (TENORMIN ) 100 MG tablet TAKE 1 TABLET BY MOUTH DAILY. MUST KEEP SCHEDULE APPT W/NEW PROVIDER FOR FUTURE REFILLS 30 tablet 11   atorvastatin  (LIPITOR ) 80 MG tablet TAKE 1 TABLET BY MOUTH EVERY DAY 90 tablet 3   baclofen  (LIORESAL ) 10 MG tablet Take 1 tablet (10 mg total) by mouth 3 (three) times daily. 30 each 2   celecoxib  (CELEBREX )  200 MG capsule Take 1 capsule (200 mg total) by mouth 2 (two) times daily. 180 capsule 3   Cholecalciferol (VITAMIN D3) 250 MCG (10000 UT) capsule Take 20,000 Units by mouth daily.     Coenzyme Q10 100 MG capsule Take 100 mg by mouth daily.      cyanocobalamin  (VITAMIN B12) 500 MCG tablet Take 500 mcg by mouth daily.     fenofibrate  160 MG tablet TAKE 1 TABLET BY MOUTH EVERY DAY 30 tablet 11   icosapent  Ethyl (VASCEPA ) 1 g capsule Take 2 capsules (2 g total) by mouth 2 (two) times daily. 120 capsule 11   magnesium  gluconate (MAGONATE) 500 MG tablet Take 500 mg by mouth daily.     Multiple Vitamin (MULTIVITAMIN WITH MINERALS) TABS tablet Take 2 tablets by mouth daily.     omeprazole  (PRILOSEC) 40 MG capsule TAKE 1 CAPSULE BY MOUTH EVERY DAY 90 capsule 3    PRESCRIPTION MEDICATION Pt uses CPAP Machine     sildenafil  (VIAGRA ) 100 MG tablet Take 0.5-1 tablets (50-100 mg total) by mouth daily as needed for erectile dysfunction. 15 tablet 11   tamsulosin  (FLOMAX ) 0.4 MG CAPS capsule Take 1 capsule (0.4 mg total) by mouth daily. 90 capsule 3   valsartan  (DIOVAN ) 160 MG tablet Take 1 tablet (160 mg total) by mouth daily. 90 tablet 3   No current facility-administered medications on file prior to visit.        ROS:  All others reviewed and negative.  Objective        PE:  BP 126/78   Pulse 61   Temp 98.1 F (36.7 C)   Ht 5' 9 (1.753 m)   Wt 250 lb (113.4 kg)   SpO2 94%   BMI 36.92 kg/m                 Constitutional: Pt appears in NAD               HENT: Head: NCAT.                Right Ear: External ear normal.                 Left Ear: External ear normal. Bilat tm's with mild erythema.  Max sinus areas non tender.  Pharynx with mild erythema, no exudate               Eyes: . Pupils are equal, round, and reactive to light. Conjunctivae and EOM are normal               Nose: without d/c or deformity               Neck: Neck supple. Gross normal ROM               Cardiovascular: Normal rate and regular rhythm.                 Pulmonary/Chest: Effort normal and breath sounds without rales or wheezing.                Abd:  Soft, NT, ND, + BS, no organomegaly               Neurological: Pt is alert. At baseline orientation, motor grossly intact               Skin: Skin is warm. No rashes, no other new lesions, LE edema - none  Psychiatric: Pt behavior is normal without agitation   Micro: none  Cardiac tracings I have personally interpreted today:  none  Pertinent Radiological findings (summarize): none   Lab Results  Component Value Date   WBC 12.1 (H) 03/28/2024   HGB 13.6 03/28/2024   HCT 41.0 03/28/2024   PLT 232.0 03/28/2024   GLUCOSE 103 (H) 06/13/2024   CHOL 126 06/13/2024   TRIG 149.0 06/13/2024   HDL 28.60  (L) 06/13/2024   LDLDIRECT 82.0 06/14/2023   LDLCALC 67 06/13/2024   ALT 36 06/13/2024   AST 50 (H) 06/13/2024   NA 136 06/13/2024   K 4.7 06/13/2024   CL 100 06/13/2024   CREATININE 1.06 06/13/2024   BUN 16 06/13/2024   CO2 26 06/13/2024   TSH 4.19 12/14/2023   PSA 0.12 12/14/2023   INR 1.61 (H) 03/07/2011   HGBA1C 6.9 (H) 06/13/2024   MICROALBUR <0.7 12/14/2023   Assessment/Plan:  Tanner Gomez is a 58 y.o. White or Caucasian [1] male with  has a past medical history of Anxiety, Arthritis (1 year), Depression, GERD (gastroesophageal reflux disease), Hyperlipidemia, Hypertension, Pancreatitis, Sleep apnea (10/16/2018), and Tobacco abuse.  Coronary artery calcification seen on CT scan Asympt, cont current med tx, refer cardiology as requested  B12 deficiency Lab Results  Component Value Date   VITAMINB12 979 (H) 12/14/2023   Stable, cont oral replacement - b12 1000 mcg qd   HTN (hypertension) BP Readings from Last 3 Encounters:  06/13/24 126/78  04/29/24 106/60  04/11/24 138/84   Stable, pt to continue medical treatment tenormin  100 mg every day, diovan  160 mg   Hyperglycemia Lab Results  Component Value Date   HGBA1C 6.9 (H) 06/13/2024   uncontrolled, pt to continue current medical treatment diet wt control, but may consider start mounjaro and let us  know    Hypertriglyceridemia Lab Results  Component Value Date   CHOL 126 06/13/2024   HDL 28.60 (L) 06/13/2024   LDLCALC 67 06/13/2024   LDLDIRECT 82.0 06/14/2023   TRIG 149.0 06/13/2024   CHOLHDL 4 06/13/2024  Cont vascepa   Peripheral edema Stable, cont off lasix   Tobacco abuse Pt still smoking, not ready to quit  Vitamin D  deficiency Last vitamin D  Lab Results  Component Value Date   VD25OH 82.68 06/13/2024   Stable, cont oral replacement   Vertigo Likely BPPV - for mecline prn recurrence  Allergic rhinitis Also for otc allegra 180 mg every day prn and/or nasacort asd prn  Followup: Return  in about 6 months (around 12/13/2024).  Lynwood Rush, MD 06/15/2024 11:17 AM [facility] - [facility]

## 2024-06-15 ENCOUNTER — Encounter: Payer: Self-pay | Admitting: Internal Medicine

## 2024-06-15 DIAGNOSIS — R42 Dizziness and giddiness: Secondary | ICD-10-CM | POA: Insufficient documentation

## 2024-06-15 DIAGNOSIS — J309 Allergic rhinitis, unspecified: Secondary | ICD-10-CM | POA: Insufficient documentation

## 2024-06-15 NOTE — Assessment & Plan Note (Signed)
 BP Readings from Last 3 Encounters:  06/13/24 126/78  04/29/24 106/60  04/11/24 138/84   Stable, pt to continue medical treatment tenormin  100 mg every day, diovan  160 mg

## 2024-06-15 NOTE — Assessment & Plan Note (Signed)
 Also for otc allegra 180 mg every day prn and/or nasacort asd prn

## 2024-06-15 NOTE — Assessment & Plan Note (Signed)
 Lab Results  Component Value Date   CHOL 126 06/13/2024   HDL 28.60 (L) 06/13/2024   LDLCALC 67 06/13/2024   LDLDIRECT 82.0 06/14/2023   TRIG 149.0 06/13/2024   CHOLHDL 4 06/13/2024  Cont vascepa 

## 2024-06-15 NOTE — Assessment & Plan Note (Signed)
Pt still smoking, not ready to quit

## 2024-06-15 NOTE — Assessment & Plan Note (Signed)
 Likely BPPV - for mecline prn recurrence

## 2024-06-15 NOTE — Assessment & Plan Note (Signed)
 Asympt, cont current med tx, refer cardiology as requested

## 2024-06-15 NOTE — Assessment & Plan Note (Signed)
 Stable, cont off lasix 

## 2024-06-15 NOTE — Assessment & Plan Note (Signed)
 Lab Results  Component Value Date   HGBA1C 6.9 (H) 06/13/2024   uncontrolled, pt to continue current medical treatment diet wt control, but may consider start mounjaro and let us  know

## 2024-06-15 NOTE — Assessment & Plan Note (Signed)
 Last vitamin D  Lab Results  Component Value Date   VD25OH 82.68 06/13/2024   Stable, cont oral replacement

## 2024-06-15 NOTE — Assessment & Plan Note (Signed)
 Lab Results  Component Value Date   VITAMINB12 979 (H) 12/14/2023   Stable, cont oral replacement - b12 1000 mcg qd

## 2024-06-17 ENCOUNTER — Other Ambulatory Visit: Payer: Self-pay

## 2024-06-17 DIAGNOSIS — F17210 Nicotine dependence, cigarettes, uncomplicated: Secondary | ICD-10-CM

## 2024-06-17 DIAGNOSIS — Z122 Encounter for screening for malignant neoplasm of respiratory organs: Secondary | ICD-10-CM

## 2024-06-17 DIAGNOSIS — Z87891 Personal history of nicotine dependence: Secondary | ICD-10-CM

## 2024-06-20 ENCOUNTER — Ambulatory Visit (INDEPENDENT_AMBULATORY_CARE_PROVIDER_SITE_OTHER): Admitting: Physical Therapy

## 2024-06-20 ENCOUNTER — Encounter: Payer: Self-pay | Admitting: Physical Therapy

## 2024-06-20 ENCOUNTER — Other Ambulatory Visit: Payer: Self-pay

## 2024-06-20 DIAGNOSIS — G8929 Other chronic pain: Secondary | ICD-10-CM

## 2024-06-20 DIAGNOSIS — M6281 Muscle weakness (generalized): Secondary | ICD-10-CM

## 2024-06-20 DIAGNOSIS — M25511 Pain in right shoulder: Secondary | ICD-10-CM

## 2024-06-20 NOTE — Therapy (Signed)
 OUTPATIENT PHYSICAL THERAPY TREATMENT   Patient Name: Tanner Gomez MRN: [id number] DOB:01-24-1966, 58 y.o., male Today's Date: 06/20/2024   END OF SESSION:  PT End of Session - 06/20/24 1057     Visit Number 4    Number of Visits 9    Date for PT Re-Evaluation 07/11/24    Authorization Type UHC    PT Start Time 1015    PT Stop Time 1055    PT Time Calculation (min) 40 min    Activity Tolerance Patient tolerated treatment well    Behavior During Therapy [facility] for tasks assessed/performed             Past Medical History:  Diagnosis Date   Anxiety    Arthritis 1 year   Depression    GERD (gastroesophageal reflux disease)    Hyperlipidemia    Hypertension    Pancreatitis    Sleep apnea 10/16/2018   Tobacco abuse    Past Surgical History:  Procedure Laterality Date   EYE SURGERY     RIGHT (CHILDHOOD)   FRACTURE SURGERY     JOINT REPLACEMENT     LEFT HEART CATH AND CORONARY ANGIOGRAPHY N/A 07/30/2023   Procedure: LEFT HEART CATH AND CORONARY ANGIOGRAPHY;  Surgeon: [place]il[place], Peter M, MD;  Location: [facility];  Service: Cardiovascular;  Laterality: N/A;   Patient Active Problem List   Diagnosis Date Noted   Vertigo 06/15/2024   Allergic rhinitis 06/15/2024   Peripheral edema 04/11/2024   Right shoulder pain 04/11/2024   BPH associated with nocturia 12/16/2023   Bilateral leg pain 12/16/2023   AVN (avascular necrosis of bone) (HCC) 06/16/2023   Erectile dysfunction 06/16/2023   Coronary artery calcification seen on CT scan 06/14/2023   Left hip pain 12/14/2022   Cervical radiculitis 12/15/2021   B12 deficiency 12/13/2021   Hyponatremia 12/13/2021   Leukocytosis 12/13/2021   Elevated TSH 12/12/2020   Vitamin D  deficiency 12/12/2020   Facial cellulitis 08/10/2020   OSA (obstructive sleep apnea) 12/01/2019   Hyperglycemia 11/27/2018   Chest pain 12/15/2017   Back pain 12/15/2017   Encounter for well adult exam with abnormal findings 06/15/2017   Muscle  cramping 06/14/2016   Pancreatitis    Tobacco abuse    GAD (generalized anxiety disorder) 07/16/2012   HTN (hypertension) 11/09/2011   Hypertriglyceridemia 11/09/2011   GERD (gastroesophageal reflux disease) 11/09/2011   LFT elevation 11/09/2011   Depression 11/09/2011   Gout 11/09/2011   Acute pancreatitis 11/09/2011    PCP: Norleen Lynwood LELON, MD  REFERRING PROVIDER: Joane Artist RAMAN, MD  REFERRING DIAG: Pain in joint of right shoulder  THERAPY DIAG:  Chronic right shoulder pain  Muscle weakness (generalized)  Rationale for Evaluation and Treatment: Rehabilitation  ONSET DATE: Chronic   SUBJECTIVE:         SUBJECTIVE STATEMENT: Patient reports he had a rough night last night. He does report other than last night it seems like his shoulder seems to be getting better and he feels better during the day. The last set of the exercises is still challenging.   Eval: Patient reports right shoulder pain that has been going on for quite a while that has gotten worse. He reports his arm will go numb like he can't even use it, mainly the more he uses it the muscles just give. It happens quicker when he is doing stuff overhead. He gets a pain in the back of the right shoulder that shoots up to the neck, then  his whole arm goes numb and limp. He did get a shot in the shoulder that helped for a couple of days. He denies any clicking/popping in his shoulder but he can feel cracking in his neck when he moves it. He has been working out with some dumbbells at the house but can't do much with the right arm. The last week or so he has also been having bad cramps in his back, and for the past 3 months it's like he can't get enough sleep.   Hand dominance: Right  PERTINENT HISTORY: See PMH above  PAIN:  Are you having pain? Yes:  NPRS scale: 6/10 currently, 8/10 at worst Pain location: Right shoulder  Pain description: Sharp, shooting, numb Aggravating factors: Any activity involving the right  arm Relieving factors: Rest  PRECAUTIONS: None  PATIENT GOALS: Pain relief   OBJECTIVE:  Note: Objective measures were completed at Evaluation unless otherwise noted. PATIENT SURVEYS:  PSFS: 1.5 Showering and shaving: 2 Working overhead: 1 Cleaning the house: 2 Washing, drying, folding clothes: 1     SENSATION: Patient reports right hand sensation deficit compared to left  POSTURE: Rounded shoulder and forward head posture  CERVICAL ROM:  Cervical ROM grossly WFL and patient does not report any right arm symptoms  UPPER EXTREMITY ROM:   Active ROM Right eval Left eval Rt 8/22/205 Rt 06/13/2024  Shoulder flexion 150 150  150  Shoulder extension      Shoulder abduction 130 145 145 150  Shoulder adduction      Shoulder internal rotation L5 T12 L2 L1  Shoulder external rotation 60 / C6 70 / C7    Elbow flexion      Elbow extension      Wrist flexion      Wrist extension      Wrist ulnar deviation      Wrist radial deviation      Wrist pronation      Wrist supination      (Blank rows = not tested)  UPPER EXTREMITY MMT:  MMT Right eval Left eval Right 06/20/2024  Shoulder flexion 4 5 4+  Shoulder extension     Shoulder abduction 4+ 5 4+  Shoulder adduction     Shoulder internal rotation 5 5   Shoulder external rotation 4 5 4+  Middle trapezius     Lower trapezius     Elbow flexion     Elbow extension     Wrist flexion     Wrist extension     Wrist ulnar deviation     Wrist radial deviation     Wrist pronation     Wrist supination     Grip strength (lbs)     (Blank rows = not tested)  Patient reports right shoulder pain with flexion and ER muscle testing  SHOULDER SPECIAL TESTS: Spurling's on right did elicit some right shoulder pain but denies any change in numbness sensation  JOINT MOBILITY TESTING:  Right shoulder hypomobility  PALPATION:  Tender to palpation right upper trap and levator scap region, posterior cuff and deltoid  TREATMENT [facility] Adult PT Treatment:                                                DATE: 06/20/2024 UBE L4 x 4 min (fwd/bwd) to improve endurance and workload capacity Supine horizontal abduction with green 3 x 10 Supine serratus punch with 10# 3 x 12 Sidelying shoulder ER with 4# 3 x 10 Inclined 45 deg scaption full range with 4# 3 x 10 Standing serratus punch with blue 2 x 15 Standing wall slide for shoulder elevation x 10 Standing shoulder scaption with 4# 3 x 10 Wall ball circles with stability ball 3 x 20 cw/ccw  PATIENT EDUCATION: Education details: HEP update Person educated: Patient Education method: Explanation, Demonstration, Tactile cues, Verbal cues, Handout Education comprehension: verbalized understanding, returned demonstration, verbal cues required, tactile cues required, and needs further education  HOME EXERCISE PROGRAM: Access Code: [email or web address]    ASSESSMENT: CLINICAL IMPRESSION: Patient tolerated therapy well with no adverse effects. Therapy focused on progressing right rotator cuff strength and endurance with good tolerance. He does report muscle fatigue and burn with his exercises. He does seem to be progressing with his strengthening and was able to update his HEP to progress banded shoulder strengthening for home. He does exhibit improvement in rotator cuff strength and improvement in use of right arm with overhead tasks. Patient would benefit from continued skilled PT to progress mobility and strength in order to reduce pain and maximize functional ability.   Eval: Patient is a 58 y.o. male who was seen today for physical therapy evaluation and treatment for chronic right shoulder pain. His symptoms seem most consistent with rotator cuff related pain with a likely cervical radicular component due to the right hand and arm numbness. He does exhibit some  limitations with his right shoulder motion and rotator cuff strength with pain while testing, tenderness of the right cervical and posterior cuff region with hypomobility of the right shoulder, and report of sensation deficit on the right.   OBJECTIVE IMPAIRMENTS: decreased activity tolerance, decreased ROM, decreased strength, postural dysfunction, and pain.   ACTIVITY LIMITATIONS: carrying, lifting, bathing, reach over head, and hygiene/grooming  PARTICIPATION LIMITATIONS: meal prep, cleaning, community activity, occupation, and yard work  PERSONAL FACTORS: Fitness, Past/current experiences, and Time since onset of injury/illness/exacerbation are also affecting patient's functional outcome.    GOALS: Goals reviewed with patient? Yes  SHORT TERM GOALS: Target date: 06/13/2024  Patient will be I with initial HEP in order to progress with therapy. Baseline: HEP provided at eval 06/13/2024: independent with initial HEP Goal status: MET  2.  Patient will report right shoulder pain </= 5/10 in order to reduce functional limitations Baseline: 8/10 06/13/2024: continues to report right shoulder pain Goal status: ONGOING  3.  Patient will report 50% improvement in right hand/arm numbness to reduce functional limitations Baseline: patient reports numbness with all overhead activity 06/13/2024: patient report improvement in right hand numbness with activity Goal status: MET  LONG TERM GOALS: Target date: 07/11/2024  Patient will be I with final HEP to maintain progress from PT. Baseline: HEP provided at eval Goal status: INITIAL  2.  Patient will report PSFS >/= 5 in order to indicate improvement in their functional ability. Baseline: 1.5 Goal status: INITIAL  3.  Patient will demonstrate right shoulder strength 5/5 MMT in order to improve tolerance for  overhead activity Baseline: see strength deficits above Goal status: INITIAL  4.  Patient will report right shoulder pain </= 2/10  with all activity in order to reduce functional limitations Baseline: 8/10 Goal status: INITIAL   PLAN: PT FREQUENCY: 1x/week  PT DURATION: 8 weeks  PLANNED INTERVENTIONS: 97164- PT Re-evaluation, 97750- Physical Performance Testing, 97110-Therapeutic exercises, 97530- Therapeutic activity, 97112- Neuromuscular re-education, 97535- Self Care, 02859- Manual therapy, 20560 (1-2 muscles), 20561 (3+ muscles)- Dry Needling, Patient/Family education, Taping, Joint mobilization, Joint manipulation, Spinal manipulation, Spinal mobilization, Cryotherapy, and Moist heat  PLAN FOR NEXT SESSION: Review HEP and progress PRN, assess cervical mobility and neurodynamics, manual/TPDN for right cervical and shoulder region, progress rotator cuff and periscapular strengthening, progress overhead activity   Elaine Daring, PT, DPT, LAT, ATC 06/20/24  10:58 AM Phone: [phone] Fax: [phone]

## 2024-07-11 ENCOUNTER — Ambulatory Visit (INDEPENDENT_AMBULATORY_CARE_PROVIDER_SITE_OTHER): Admitting: Physical Therapy

## 2024-07-11 ENCOUNTER — Telehealth: Payer: Self-pay | Admitting: Internal Medicine

## 2024-07-11 ENCOUNTER — Other Ambulatory Visit: Payer: Self-pay

## 2024-07-11 ENCOUNTER — Encounter: Payer: Self-pay | Admitting: Physical Therapy

## 2024-07-11 DIAGNOSIS — M25511 Pain in right shoulder: Secondary | ICD-10-CM

## 2024-07-11 DIAGNOSIS — M6281 Muscle weakness (generalized): Secondary | ICD-10-CM

## 2024-07-11 DIAGNOSIS — G8929 Other chronic pain: Secondary | ICD-10-CM | POA: Diagnosis not present

## 2024-07-11 NOTE — Therapy (Signed)
 OUTPATIENT PHYSICAL THERAPY TREATMENT   Patient Name: Tanner Gomez MRN: [id number] DOB:01-14-1966, 58 y.o., male Today's Date: 07/11/2024   END OF SESSION:  PT End of Session - 07/11/24 1009     Visit Number 5    Number of Visits 8    Date for Recertification  08/22/24    Authorization Type UHC    PT Start Time 1004    PT Stop Time 1045    PT Time Calculation (min) 41 min    Activity Tolerance Patient tolerated treatment well    Behavior During Therapy [facility] for tasks assessed/performed              Past Medical History:  Diagnosis Date   Anxiety    Arthritis 1 year   Depression    GERD (gastroesophageal reflux disease)    Hyperlipidemia    Hypertension    Pancreatitis    Sleep apnea 10/16/2018   Tobacco abuse    Past Surgical History:  Procedure Laterality Date   EYE SURGERY     RIGHT (CHILDHOOD)   FRACTURE SURGERY     JOINT REPLACEMENT     LEFT HEART CATH AND CORONARY ANGIOGRAPHY N/A 07/30/2023   Procedure: LEFT HEART CATH AND CORONARY ANGIOGRAPHY;  Surgeon: [place]az[place], Peter M, MD;  Location: [facility];  Service: Cardiovascular;  Laterality: N/A;   Patient Active Problem List   Diagnosis Date Noted   Vertigo 06/15/2024   Allergic rhinitis 06/15/2024   Peripheral edema 04/11/2024   Right shoulder pain 04/11/2024   BPH associated with nocturia 12/16/2023   Bilateral leg pain 12/16/2023   AVN (avascular necrosis of bone) (HCC) 06/16/2023   Erectile dysfunction 06/16/2023   Coronary artery calcification seen on CT scan 06/14/2023   Left hip pain 12/14/2022   Cervical radiculitis 12/15/2021   B12 deficiency 12/13/2021   Hyponatremia 12/13/2021   Leukocytosis 12/13/2021   Elevated TSH 12/12/2020   Vitamin D  deficiency 12/12/2020   Facial cellulitis 08/10/2020   OSA (obstructive sleep apnea) 12/01/2019   Hyperglycemia 11/27/2018   Chest pain 12/15/2017   Back pain 12/15/2017   Encounter for well adult exam with abnormal findings 06/15/2017    Muscle cramping 06/14/2016   Pancreatitis    Tobacco abuse    GAD (generalized anxiety disorder) 07/16/2012   HTN (hypertension) 11/09/2011   Hypertriglyceridemia 11/09/2011   GERD (gastroesophageal reflux disease) 11/09/2011   LFT elevation 11/09/2011   Depression 11/09/2011   Gout 11/09/2011   Acute pancreatitis 11/09/2011    PCP: Norleen Lynwood LELON, MD  REFERRING PROVIDER: Joane Artist RAMAN, MD  REFERRING DIAG: Pain in joint of right shoulder  THERAPY DIAG:  Chronic right shoulder pain  Muscle weakness (generalized)  Rationale for Evaluation and Treatment: Rehabilitation  ONSET DATE: Chronic   SUBJECTIVE:         SUBJECTIVE STATEMENT: Patient reports his shoulder has been pretty good, states he can feel his shoulder getting funny feeling because of the rain.   Eval: Patient reports right shoulder pain that has been going on for quite a while that has gotten worse. He reports his arm will go numb like he can't even use it, mainly the more he uses it the muscles just give. It happens quicker when he is doing stuff overhead. He gets a pain in the back of the right shoulder that shoots up to the neck, then his whole arm goes numb and limp. He did get a shot in the shoulder that helped for a couple  of days. He denies any clicking/popping in his shoulder but he can feel cracking in his neck when he moves it. He has been working out with some dumbbells at the house but can't do much with the right arm. The last week or so he has also been having bad cramps in his back, and for the past 3 months it's like he can't get enough sleep.   Hand dominance: Right  PERTINENT HISTORY: See PMH above  PAIN:  Are you having pain? Yes:  NPRS scale: 0/10 currently, 5/10 at worst Pain location: Right shoulder  Pain description: Sharp, shooting, numb Aggravating factors: Any activity involving the right arm Relieving factors: Rest  PRECAUTIONS: None  PATIENT GOALS: Pain relief   OBJECTIVE:   Note: Objective measures were completed at Evaluation unless otherwise noted. PATIENT SURVEYS:  PSFS: 1.5 Showering and shaving: 2 Working overhead: 1 Cleaning the house: 2 Washing, drying, folding clothes: 1  07/11/2024: PSFS: 4.5 Showering and shaving: 5 Working overhead: 5 Cleaning the house: 5 Washing, drying, folding clothes: 3     SENSATION: Patient reports right hand sensation deficit compared to left  POSTURE: Rounded shoulder and forward head posture  CERVICAL ROM:  Cervical ROM grossly WFL and patient does not report any right arm symptoms  UPPER EXTREMITY ROM:   Active ROM Right eval Left eval Rt 8/22/205 Rt 06/13/2024 Right 07/11/2024  Shoulder flexion 150 150  150 150  Shoulder extension       Shoulder abduction 130 145 145 150 150  Shoulder adduction       Shoulder internal rotation L5 T12 L2 L1 L1  Shoulder external rotation 60 / C6 70 / C7     Elbow flexion       Elbow extension       Wrist flexion       Wrist extension       Wrist ulnar deviation       Wrist radial deviation       Wrist pronation       Wrist supination       (Blank rows = not tested)  UPPER EXTREMITY MMT:  MMT Right eval Left eval Right 06/20/2024 Right 07/11/2024  Shoulder flexion 4 5 4+ 5  Shoulder extension      Shoulder abduction 4+ 5 4+ 4+  Shoulder adduction      Shoulder internal rotation 5 5    Shoulder external rotation 4 5 4+ 4+  Middle trapezius      Lower trapezius      Elbow flexion      Elbow extension      Wrist flexion      Wrist extension      Wrist ulnar deviation      Wrist radial deviation      Wrist pronation      Wrist supination      Grip strength (lbs)      (Blank rows = not tested)  Patient reports right shoulder pain with flexion and ER muscle testing  SHOULDER SPECIAL TESTS: Spurling's on right did elicit some right shoulder pain but denies any change in numbness sensation  JOINT MOBILITY TESTING:  Right shoulder  hypomobility  PALPATION:  Tender to palpation right upper trap and levator scap region, posterior cuff and deltoid  TREATMENT [facility] Adult PT Treatment:                                                DATE: 07/11/2024 UBE L4 x 5 min (fwd/bwd) to improve endurance and workload capacity Inclined 45 deg scaption full range with 3# x 10, 5# 3 x 10 Standing shoulder ER with red 3 x 10 Standing scaption with red 3 x 10 Standing serratus punch with blue x 15 Standing horizontal abduction with red 2 x 10 Bent over row supported on table with 20# 3 x 10 Wall ball circles at 90 deg flexion 2 x 20 cw/ccw  Discussed HEP transitioning to every other day to allow for muscle recover and reduced soreness, discussed incorporating dumbbell exercises he was previously performing after his next visit to ensure he does well with progression in banded resistance and change in frequency of performing exercises.   PATIENT EDUCATION: Education details: HEP Person educated: Patient Education method: Solicitor, Actor cues, Verbal cues Education comprehension: verbalized understanding, returned demonstration, verbal cues required, tactile cues required, and needs further education  HOME EXERCISE PROGRAM: Access Code: [email or web address]    ASSESSMENT: CLINICAL IMPRESSION: Patient tolerated therapy well with no adverse effects. Therapy continues to focus on progressing strength and endurance of right rotator cuff. He was able to progress with banded resistance for rotator cuff this visit and instructed patient to use stronger band for HEP and to transition to performing exercises every other day. He continues to exhibit good range of motion of the right shoulder but does still exhibit slight strength deficit of the right rotator cuff compared to the left. He reports improvement in his  functional status on PSFS but does remain limited with overhead and household activity. Patient would benefit from continued skilled PT to progress mobility and strength in order to reduce pain and maximize functional ability, so will extend PT POC for 6 more weeks.   Eval: Patient is a 58 y.o. male who was seen today for physical therapy evaluation and treatment for chronic right shoulder pain. His symptoms seem most consistent with rotator cuff related pain with a likely cervical radicular component due to the right hand and arm numbness. He does exhibit some limitations with his right shoulder motion and rotator cuff strength with pain while testing, tenderness of the right cervical and posterior cuff region with hypomobility of the right shoulder, and report of sensation deficit on the right.   OBJECTIVE IMPAIRMENTS: decreased activity tolerance, decreased ROM, decreased strength, postural dysfunction, and pain.   ACTIVITY LIMITATIONS: carrying, lifting, bathing, reach over head, and hygiene/grooming  PARTICIPATION LIMITATIONS: meal prep, cleaning, community activity, occupation, and yard work  PERSONAL FACTORS: Fitness, Past/current experiences, and Time since onset of injury/illness/exacerbation are also affecting patient's functional outcome.    GOALS: Goals reviewed with patient? Yes  SHORT TERM GOALS: Target date: 06/13/2024  Patient will be I with initial HEP in order to progress with therapy. Baseline: HEP provided at eval 06/13/2024: independent with initial HEP Goal status: MET  2.  Patient will report right shoulder pain </= 5/10 in order to reduce functional limitations Baseline: 8/10 06/13/2024: continues to report right shoulder pain 07/11/2024: 5/10 Goal status: MET  3.  Patient will report 50% improvement in right hand/arm numbness to reduce functional limitations Baseline: patient reports numbness with all overhead activity 06/13/2024: patient report  improvement in  right hand numbness with activity Goal status: MET  LONG TERM GOALS: Target date: 08/22/2024  Patient will be I with final HEP to maintain progress from PT. Baseline: HEP provided at eval 07/11/2024: progressing banded resistance Goal status: ONGOING  2.  Patient will report PSFS >/= 5 in order to indicate improvement in their functional ability. Baseline: 1.5 07/11/2024: 4.5 Goal status: ONGOING  3.  Patient will demonstrate right shoulder strength 5/5 MMT in order to improve tolerance for overhead activity Baseline: see strength deficits above 07/11/2024: grossly 4+/5 MMT Goal status: ONGOING  4.  Patient will report right shoulder pain </= 2/10 with all activity in order to reduce functional limitations Baseline: 8/10 07/11/2024: 5/10 Goal status: ONGOING   PLAN: PT FREQUENCY: every other week  PT DURATION: 6 weeks  PLANNED INTERVENTIONS: 97164- PT Re-evaluation, 97750- Physical Performance Testing, 97110-Therapeutic exercises, 97530- Therapeutic activity, 97112- Neuromuscular re-education, 97535- Self Care, 02859- Manual therapy, 20560 (1-2 muscles), 20561 (3+ muscles)- Dry Needling, Patient/Family education, Taping, Joint mobilization, Joint manipulation, Spinal manipulation, Spinal mobilization, Cryotherapy, and Moist heat  PLAN FOR NEXT SESSION: Review HEP and progress PRN, progress rotator cuff and periscapular strengthening and endurance, progress overhead activity   Elaine Daring, PT, DPT, LAT, ATC 07/11/24  10:55 AM Phone: [phone] Fax: [phone]

## 2024-07-11 NOTE — Telephone Encounter (Signed)
 PT requested to try munjaro, declined booking an appointment at the time of inquiry, would like contact through a [employer]

## 2024-07-15 ENCOUNTER — Telehealth: Payer: Self-pay

## 2024-07-15 ENCOUNTER — Other Ambulatory Visit (HOSPITAL_COMMUNITY): Payer: Self-pay

## 2024-07-15 MED ORDER — TIRZEPATIDE 2.5 MG/0.5ML ~~LOC~~ SOAJ: 2.5000 mg | SUBCUTANEOUS | 11 refills | Status: DC

## 2024-07-15 NOTE — Telephone Encounter (Signed)
Message sent to Pt via My chart

## 2024-07-15 NOTE — Telephone Encounter (Signed)
 Ok script is done erx

## 2024-07-15 NOTE — Telephone Encounter (Signed)
 Pharmacy Patient Advocate Encounter   Received notification from Onbase that prior authorization for Mounjaro 2.5MG /0.5ML is required/requested.   Insurance verification completed.   The patient is insured through [facility] .   Per test claim: PA required; PA submitted to above mentioned insurance via Prompt PA Key/confirmation #/EOC [id number] Status is pending

## 2024-07-17 ENCOUNTER — Telehealth: Payer: Self-pay

## 2024-07-17 ENCOUNTER — Other Ambulatory Visit (HOSPITAL_COMMUNITY): Payer: Self-pay

## 2024-07-17 NOTE — Telephone Encounter (Signed)
 Please note Addt info has been requested via the pts plan and has been faxed back to the plan as of 10/2. The determination is currently Pending.[email or web address]

## 2024-07-18 ENCOUNTER — Other Ambulatory Visit: Payer: Self-pay

## 2024-07-18 ENCOUNTER — Telehealth: Payer: Self-pay

## 2024-07-18 ENCOUNTER — Other Ambulatory Visit (HOSPITAL_COMMUNITY): Payer: Self-pay

## 2024-07-18 ENCOUNTER — Other Ambulatory Visit: Payer: Self-pay | Admitting: Internal Medicine

## 2024-07-18 DIAGNOSIS — K219 Gastro-esophageal reflux disease without esophagitis: Secondary | ICD-10-CM

## 2024-07-18 NOTE — Telephone Encounter (Signed)
 Pharmacy Patient Advocate Encounter  Received notification from [employer] that Prior Authorization for  Mounjaro 2.5MG /0.5ML  has been DENIED.  Full denial letter will be uploaded to the media tab. See denial reason below.   PA #/Case ID/Reference #: [id number]

## 2024-07-18 NOTE — Telephone Encounter (Signed)
 Copied from CRM [id number]. Topic: Clinical - Medication Question >> Jul 18, 2024  3:05 PM Deaijah H wrote: Reason for CRM: Patient called in stating pharmacy sent a message needing additional information mounjaro due to them not filling advised PA for prescription was denied. Would like a nurse to give a callback with more information on why. Please call [phone]

## 2024-07-23 ENCOUNTER — Telehealth: Payer: Self-pay | Admitting: Pharmacist

## 2024-07-23 NOTE — Telephone Encounter (Signed)
 Appeal has been submitted. Will advise when response is received, please be advised that most companies may take 30 days to make a decision. Appeal letter and supporting documentation have been faxed to [phone] on 07/23/2024 @3 :15 pm.  Thank you, Devere Pandy, PharmD Clinical Pharmacist  [place]  Direct Dial: [phone]

## 2024-07-23 NOTE — Telephone Encounter (Signed)
 Appeal has been requested for Rx Mounjaro

## 2024-07-23 NOTE — Telephone Encounter (Signed)
 Please see encounter from 9/30 were it was denied.

## 2024-07-24 NOTE — Telephone Encounter (Signed)
 Please disguard. Determination has not been received

## 2024-07-24 NOTE — Telephone Encounter (Signed)
 Please disgard. Determination has not  been received

## 2024-07-24 NOTE — Telephone Encounter (Signed)
 Appeal has been approved thru 10.9.26

## 2024-07-24 NOTE — Telephone Encounter (Signed)
 Additional information has been requested from the patient's insurance in order to proceed with the prior authorization request. Requested information has been sent, or form has been filled out and faxed back to [phone]  EOC ID: [id number]

## 2024-07-25 ENCOUNTER — Ambulatory Visit (INDEPENDENT_AMBULATORY_CARE_PROVIDER_SITE_OTHER): Admitting: Physical Therapy

## 2024-07-25 ENCOUNTER — Encounter: Payer: Self-pay | Admitting: Physical Therapy

## 2024-07-25 ENCOUNTER — Other Ambulatory Visit: Payer: Self-pay

## 2024-07-25 DIAGNOSIS — M25511 Pain in right shoulder: Secondary | ICD-10-CM | POA: Diagnosis not present

## 2024-07-25 DIAGNOSIS — M6281 Muscle weakness (generalized): Secondary | ICD-10-CM | POA: Diagnosis not present

## 2024-07-25 DIAGNOSIS — G8929 Other chronic pain: Secondary | ICD-10-CM | POA: Diagnosis not present

## 2024-07-25 MED ORDER — SILDENAFIL CITRATE 100 MG PO TABS: 50.0000 mg | ORAL_TABLET | Freq: Every day | ORAL | 11 refills | Status: AC | PRN

## 2024-07-25 NOTE — Telephone Encounter (Signed)
 Ok I have increased the quantity for the sildenafil  and new script done erx

## 2024-07-25 NOTE — Therapy (Signed)
 OUTPATIENT PHYSICAL THERAPY TREATMENT   Patient Name: Tanner Gomez MRN: [id number] DOB:[birth year], 58 y.o., male Today's Date: 07/25/2024   END OF SESSION:  PT End of Session - 07/25/24 1020     Visit Number 6    Number of Visits 8    Date for Recertification  08/22/24    Authorization Type UHC    PT Start Time 1016    PT Stop Time 1058    PT Time Calculation (min) 42 min    Activity Tolerance Patient tolerated treatment well    Behavior During Therapy [facility] for tasks assessed/performed               Past Medical History:  Diagnosis Date   Anxiety    Arthritis 1 year   Depression    GERD (gastroesophageal reflux disease)    Hyperlipidemia    Hypertension    Pancreatitis    Sleep apnea 10/16/2018   Tobacco abuse    Past Surgical History:  Procedure Laterality Date   EYE SURGERY     RIGHT (CHILDHOOD)   FRACTURE SURGERY     JOINT REPLACEMENT     LEFT HEART CATH AND CORONARY ANGIOGRAPHY N/A 07/30/2023   Procedure: LEFT HEART CATH AND CORONARY ANGIOGRAPHY;  Surgeon: [place], Peter M, MD;  Location: [facility];  Service: Cardiovascular;  Laterality: N/A;   Patient Active Problem List   Diagnosis Date Noted   Vertigo 06/15/2024   Allergic rhinitis 06/15/2024   Peripheral edema 04/11/2024   Right shoulder pain 04/11/2024   BPH associated with nocturia 12/16/2023   Bilateral leg pain 12/16/2023   AVN (avascular necrosis of bone) (HCC) 06/16/2023   Erectile dysfunction 06/16/2023   Coronary artery calcification seen on CT scan 06/14/2023   Left hip pain 12/14/2022   Cervical radiculitis 12/15/2021   B12 deficiency 12/13/2021   Hyponatremia 12/13/2021   Leukocytosis 12/13/2021   Elevated TSH 12/12/2020   Vitamin D  deficiency 12/12/2020   Facial cellulitis 08/10/2020   OSA (obstructive sleep apnea) 12/01/2019   Hyperglycemia 11/27/2018   Chest pain 12/15/2017   Back pain 12/15/2017   Encounter for well adult exam with abnormal findings 06/15/2017    Muscle cramping 06/14/2016   Pancreatitis    Tobacco abuse    GAD (generalized anxiety disorder) 07/16/2012   HTN (hypertension) 11/09/2011   Hypertriglyceridemia 11/09/2011   GERD (gastroesophageal reflux disease) 11/09/2011   LFT elevation 11/09/2011   Depression 11/09/2011   Gout 11/09/2011   Acute pancreatitis 11/09/2011    PCP: Norleen Lynwood LELON, MD  REFERRING PROVIDER: Joane Artist RAMAN, MD  REFERRING DIAG: Pain in joint of right shoulder  THERAPY DIAG:  Chronic right shoulder pain  Muscle weakness (generalized)  Rationale for Evaluation and Treatment: Rehabilitation  ONSET DATE: Chronic   SUBJECTIVE:         SUBJECTIVE STATEMENT: Patient reports his right shoulder has been alright, not giving him too much trouble.  Eval: Patient reports right shoulder pain that has been going on for quite a while that has gotten worse. He reports his arm will go numb like he can't even use it, mainly the more he uses it the muscles just give. It happens quicker when he is doing stuff overhead. He gets a pain in the back of the right shoulder that shoots up to the neck, then his whole arm goes numb and limp. He did get a shot in the shoulder that helped for a couple of days. He denies any clicking/popping in  his shoulder but he can feel cracking in his neck when he moves it. He has been working out with some dumbbells at the house but can't do much with the right arm. The last week or so he has also been having bad cramps in his back, and for the past 3 months it's like he can't get enough sleep.   Hand dominance: Right  PERTINENT HISTORY: See PMH above  PAIN:  Are you having pain? Yes:  NPRS scale: 0/10 currently, 5/10 at worst Pain location: Right shoulder  Pain description: Sharp, shooting, numb Aggravating factors: Any activity involving the right arm Relieving factors: Rest  PRECAUTIONS: None  PATIENT GOALS: Pain relief   OBJECTIVE:  Note: Objective measures were completed at  Evaluation unless otherwise noted. PATIENT SURVEYS:  PSFS: 1.5 Showering and shaving: 2 Working overhead: 1 Cleaning the house: 2 Washing, drying, folding clothes: 1  07/11/2024: PSFS: 4.5 Showering and shaving: 5 Working overhead: 5 Cleaning the house: 5 Washing, drying, folding clothes: 3     SENSATION: Patient reports right hand sensation deficit compared to left  POSTURE: Rounded shoulder and forward head posture  CERVICAL ROM:  Cervical ROM grossly WFL and patient does not report any right arm symptoms  UPPER EXTREMITY ROM:   Active ROM Right eval Left eval Rt 8/22/205 Rt 06/13/2024 Right 07/11/2024  Shoulder flexion 150 150  150 150  Shoulder extension       Shoulder abduction 130 145 145 150 150  Shoulder adduction       Shoulder internal rotation L5 T12 L2 L1 L1  Shoulder external rotation 60 / C6 70 / C7     Elbow flexion       Elbow extension       Wrist flexion       Wrist extension       Wrist ulnar deviation       Wrist radial deviation       Wrist pronation       Wrist supination       (Blank rows = not tested)  UPPER EXTREMITY MMT:  MMT Right eval Left eval Right 06/20/2024 Right 07/11/2024  Shoulder flexion 4 5 4+ 5  Shoulder extension      Shoulder abduction 4+ 5 4+ 4+  Shoulder adduction      Shoulder internal rotation 5 5    Shoulder external rotation 4 5 4+ 4+  Middle trapezius      Lower trapezius      Elbow flexion      Elbow extension      Wrist flexion      Wrist extension      Wrist ulnar deviation      Wrist radial deviation      Wrist pronation      Wrist supination      Grip strength (lbs)      (Blank rows = not tested)  Patient reports right shoulder pain with flexion and ER muscle testing  SHOULDER SPECIAL TESTS: Spurling's on right did elicit some right shoulder pain but denies any change in numbness sensation  JOINT MOBILITY TESTING:  Right shoulder hypomobility  PALPATION:  Tender to palpation right upper  trap and levator scap region, posterior cuff and deltoid  TREATMENT [facility] Adult PT Treatment:                                                DATE: 07/25/2024 UBE L4 x 5 min (fwd/bwd) to improve endurance and workload capacity Inclined 45 deg scaption full range with 5# 3 x 10 Inclined 45 deg dowel press to lat pull with 10# 2 x 10 Single arm high row with L2 powerband 3 x 10 Bent over row supported on table with 25# 3 x 10 each Standing shoulder abduction palm down with 3# 3 x 10 Supine serratus punch with 10# 3 x 10 Supine horizontal abduction with green 3 x 10  PATIENT EDUCATION: Education details: HEP Person educated: Patient Education method: Programmer, multimedia, Demonstration, Actor cues, Verbal cues Education comprehension: verbalized understanding, returned demonstration, verbal cues required, tactile cues required, and needs further education  HOME EXERCISE PROGRAM: Access Code: [email or web address]    ASSESSMENT: CLINICAL IMPRESSION: Patient tolerated therapy well with no adverse effects. Therapy continues to focus on progressing his right shoulder strength and endurance with good tolerance. He does report feeling strong and able to do more with the right arm around the home, but it does still get fatigued quicker than the left. He feels better now that his is doing his exercises every other day and they are still challenging him. No changes to HEP this visit. Patient would benefit from continued skilled PT to progress mobility and strength in order to reduce pain and maximize functional ability.   Eval: Patient is a 58 y.o. male who was seen today for physical therapy evaluation and treatment for chronic right shoulder pain. His symptoms seem most consistent with rotator cuff related pain with a likely cervical radicular component due to the right hand and arm numbness. He  does exhibit some limitations with his right shoulder motion and rotator cuff strength with pain while testing, tenderness of the right cervical and posterior cuff region with hypomobility of the right shoulder, and report of sensation deficit on the right.   OBJECTIVE IMPAIRMENTS: decreased activity tolerance, decreased ROM, decreased strength, postural dysfunction, and pain.   ACTIVITY LIMITATIONS: carrying, lifting, bathing, reach over head, and hygiene/grooming  PARTICIPATION LIMITATIONS: meal prep, cleaning, community activity, occupation, and yard work  PERSONAL FACTORS: Fitness, Past/current experiences, and Time since onset of injury/illness/exacerbation are also affecting patient's functional outcome.    GOALS: Goals reviewed with patient? Yes  SHORT TERM GOALS: Target date: 06/13/2024  Patient will be I with initial HEP in order to progress with therapy. Baseline: HEP provided at eval 06/13/2024: independent with initial HEP Goal status: MET  2.  Patient will report right shoulder pain </= 5/10 in order to reduce functional limitations Baseline: 8/10 06/13/2024: continues to report right shoulder pain 07/11/2024: 5/10 Goal status: MET  3.  Patient will report 50% improvement in right hand/arm numbness to reduce functional limitations Baseline: patient reports numbness with all overhead activity 06/13/2024: patient report improvement in right hand numbness with activity Goal status: MET  LONG TERM GOALS: Target date: 08/22/2024  Patient will be I with final HEP to maintain progress from PT. Baseline: HEP provided at eval 07/11/2024: progressing banded resistance Goal status: ONGOING  2.  Patient will report PSFS >/= 5 in order to indicate improvement in their functional ability. Baseline: 1.5 07/11/2024: 4.5 Goal status: ONGOING  3.  Patient will  demonstrate right shoulder strength 5/5 MMT in order to improve tolerance for overhead activity Baseline: see strength  deficits above 07/11/2024: grossly 4+/5 MMT Goal status: ONGOING  4.  Patient will report right shoulder pain </= 2/10 with all activity in order to reduce functional limitations Baseline: 8/10 07/11/2024: 5/10 Goal status: ONGOING   PLAN: PT FREQUENCY: every other week  PT DURATION: 6 weeks  PLANNED INTERVENTIONS: 97164- PT Re-evaluation, 97750- Physical Performance Testing, 97110-Therapeutic exercises, 97530- Therapeutic activity, 97112- Neuromuscular re-education, 97535- Self Care, 02859- Manual therapy, 20560 (1-2 muscles), 20561 (3+ muscles)- Dry Needling, Patient/Family education, Taping, Joint mobilization, Joint manipulation, Spinal manipulation, Spinal mobilization, Cryotherapy, and Moist heat  PLAN FOR NEXT SESSION: Review HEP and progress PRN, progress rotator cuff and periscapular strengthening and endurance, progress overhead activity   Elaine Daring, PT, DPT, LAT, ATC 07/25/24  11:02 AM Phone: [phone] Fax: [phone]

## 2024-07-28 ENCOUNTER — Other Ambulatory Visit (HOSPITAL_COMMUNITY): Payer: Self-pay

## 2024-07-28 NOTE — Telephone Encounter (Signed)
 Pharmacy Patient Advocate Encounter  Received notification from [employer] that Prior Authorization for Mounjaro 2.5mg  has been APPROVED from 07/25/24 to 10/24/24. Ran test claim, Copay is $25. This test claim was processed through [facility]- copay amounts may vary at other pharmacies due to pharmacy/plan contracts, or as the patient moves through the different stages of their insurance plan.   PA #/Case ID/Reference #: [id number]

## 2024-07-29 NOTE — Telephone Encounter (Signed)
 Ok to contact pt - mounjaro approved     thanks

## 2024-08-05 ENCOUNTER — Other Ambulatory Visit (HOSPITAL_COMMUNITY): Payer: Self-pay

## 2024-08-05 ENCOUNTER — Other Ambulatory Visit: Payer: Self-pay

## 2024-08-05 ENCOUNTER — Other Ambulatory Visit: Payer: Self-pay | Admitting: Internal Medicine

## 2024-08-05 MED ORDER — TIRZEPATIDE 2.5 MG/0.5ML ~~LOC~~ SOAJ
2.5000 mg | SUBCUTANEOUS | 11 refills | Status: AC
  Filled 2024-08-05: qty 2, 28d supply, fill #0
  Filled 2024-08-29: qty 2, 28d supply, fill #1
  Filled 2024-09-28: qty 2, 28d supply, fill #2
  Filled 2024-11-06: qty 2, 28d supply, fill #3

## 2024-08-05 NOTE — Addendum Note (Signed)
 Addended by: NORLEEN LYNWOOD ORN on: 08/05/2024 11:54 AM   Modules accepted: Orders

## 2024-08-05 NOTE — Telephone Encounter (Signed)
 Appeal Approved.

## 2024-08-05 NOTE — Telephone Encounter (Signed)
Ok this is done thanks

## 2024-08-06 ENCOUNTER — Other Ambulatory Visit (HOSPITAL_COMMUNITY): Payer: Self-pay

## 2024-08-08 ENCOUNTER — Ambulatory Visit (INDEPENDENT_AMBULATORY_CARE_PROVIDER_SITE_OTHER): Admitting: Physical Therapy

## 2024-08-08 ENCOUNTER — Encounter: Payer: Self-pay | Admitting: Physical Therapy

## 2024-08-08 ENCOUNTER — Other Ambulatory Visit: Payer: Self-pay

## 2024-08-08 DIAGNOSIS — M6281 Muscle weakness (generalized): Secondary | ICD-10-CM

## 2024-08-08 DIAGNOSIS — G8929 Other chronic pain: Secondary | ICD-10-CM

## 2024-08-08 DIAGNOSIS — M25511 Pain in right shoulder: Secondary | ICD-10-CM | POA: Diagnosis not present

## 2024-08-08 NOTE — Therapy (Signed)
 OUTPATIENT PHYSICAL THERAPY TREATMENT   Patient Name: Tanner Gomez MRN: [id number] DOB:[birth year], 58 y.o., male Today's Date: 08/08/2024   END OF SESSION:  PT End of Session - 08/08/24 1052     Visit Number 7    Number of Visits 8    Date for Recertification  08/22/24    Authorization Type UHC    PT Start Time 1020    PT Stop Time 1100    PT Time Calculation (min) 40 min    Activity Tolerance Patient tolerated treatment well    Behavior During Therapy [facility] for tasks assessed/performed                Past Medical History:  Diagnosis Date   Anxiety    Arthritis 1 year   Depression    GERD (gastroesophageal reflux disease)    Hyperlipidemia    Hypertension    Pancreatitis    Sleep apnea 10/16/2018   Tobacco abuse    Past Surgical History:  Procedure Laterality Date   EYE SURGERY     RIGHT (CHILDHOOD)   FRACTURE SURGERY     JOINT REPLACEMENT     LEFT HEART CATH AND CORONARY ANGIOGRAPHY N/A 07/30/2023   Procedure: LEFT HEART CATH AND CORONARY ANGIOGRAPHY;  Surgeon: [place], Peter M, MD;  Location: [facility];  Service: Cardiovascular;  Laterality: N/A;   Patient Active Problem List   Diagnosis Date Noted   Vertigo 06/15/2024   Allergic rhinitis 06/15/2024   Peripheral edema 04/11/2024   Right shoulder pain 04/11/2024   BPH associated with nocturia 12/16/2023   Bilateral leg pain 12/16/2023   AVN (avascular necrosis of bone) (HCC) 06/16/2023   Erectile dysfunction 06/16/2023   Coronary artery calcification seen on CT scan 06/14/2023   Left hip pain 12/14/2022   Cervical radiculitis 12/15/2021   B12 deficiency 12/13/2021   Hyponatremia 12/13/2021   Leukocytosis 12/13/2021   Elevated TSH 12/12/2020   Vitamin D  deficiency 12/12/2020   Facial cellulitis 08/10/2020   OSA (obstructive sleep apnea) 12/01/2019   Hyperglycemia 11/27/2018   Chest pain 12/15/2017   Back pain 12/15/2017   Encounter for well adult exam with abnormal findings 06/15/2017    Muscle cramping 06/14/2016   Pancreatitis    Tobacco abuse    GAD (generalized anxiety disorder) 07/16/2012   HTN (hypertension) 11/09/2011   Hypertriglyceridemia 11/09/2011   GERD (gastroesophageal reflux disease) 11/09/2011   LFT elevation 11/09/2011   Depression 11/09/2011   Gout 11/09/2011   Acute pancreatitis 11/09/2011    PCP: Norleen Lynwood LELON, MD  REFERRING PROVIDER: Joane Artist RAMAN, MD  REFERRING DIAG: Pain in joint of right shoulder  THERAPY DIAG:  Chronic right shoulder pain  Muscle weakness (generalized)  Rationale for Evaluation and Treatment: Rehabilitation  ONSET DATE: Chronic   SUBJECTIVE:         SUBJECTIVE STATEMENT: Patient reports his right shoulder is bothering him a little more today due to the cold weather.  Eval: Patient reports right shoulder pain that has been going on for quite a while that has gotten worse. He reports his arm will go numb like he can't even use it, mainly the more he uses it the muscles just give. It happens quicker when he is doing stuff overhead. He gets a pain in the back of the right shoulder that shoots up to the neck, then his whole arm goes numb and limp. He did get a shot in the shoulder that helped for a couple of days. He  denies any clicking/popping in his shoulder but he can feel cracking in his neck when he moves it. He has been working out with some dumbbells at the house but can't do much with the right arm. The last week or so he has also been having bad cramps in his back, and for the past 3 months it's like he can't get enough sleep.   Hand dominance: Right  PERTINENT HISTORY: See PMH above  PAIN:  Are you having pain? Yes:  NPRS scale: 2/10 currently, 5/10 at worst Pain location: Right shoulder  Pain description: Sharp, shooting, numb Aggravating factors: Any activity involving the right arm Relieving factors: Rest  PRECAUTIONS: None  PATIENT GOALS: Pain relief   OBJECTIVE:  Note: Objective measures were  completed at Evaluation unless otherwise noted. PATIENT SURVEYS:  PSFS: 1.5 Showering and shaving: 2 Working overhead: 1 Cleaning the house: 2 Washing, drying, folding clothes: 1  07/11/2024: PSFS: 4.5 Showering and shaving: 5 Working overhead: 5 Cleaning the house: 5 Washing, drying, folding clothes: 3     SENSATION: Patient reports right hand sensation deficit compared to left  POSTURE: Rounded shoulder and forward head posture  CERVICAL ROM:  Cervical ROM grossly WFL and patient does not report any right arm symptoms  UPPER EXTREMITY ROM:   Active ROM Right eval Left eval Rt 8/22/205 Rt 06/13/2024 Right 07/11/2024  Shoulder flexion 150 150  150 150  Shoulder extension       Shoulder abduction 130 145 145 150 150  Shoulder adduction       Shoulder internal rotation L5 T12 L2 L1 L1  Shoulder external rotation 60 / C6 70 / C7     Elbow flexion       Elbow extension       Wrist flexion       Wrist extension       Wrist ulnar deviation       Wrist radial deviation       Wrist pronation       Wrist supination       (Blank rows = not tested)  UPPER EXTREMITY MMT:  MMT Right eval Left eval Right 06/20/2024 Right 07/11/2024 Right 08/08/2024  Shoulder flexion 4 5 4+ 5   Shoulder extension       Shoulder abduction 4+ 5 4+ 4+   Shoulder adduction       Shoulder internal rotation 5 5     Shoulder external rotation 4 5 4+ 4+ 4+  Middle trapezius       Lower trapezius       Elbow flexion       Elbow extension       Wrist flexion       Wrist extension       Wrist ulnar deviation       Wrist radial deviation       Wrist pronation       Wrist supination       Grip strength (lbs)       (Blank rows = not tested)  Patient reports right shoulder pain with flexion and ER muscle testing  SHOULDER SPECIAL TESTS: Spurling's on right did elicit some right shoulder pain but denies any change in numbness sensation  JOINT MOBILITY TESTING:  Right shoulder  hypomobility  PALPATION:  Tender to palpation right upper trap and levator scap region, posterior cuff and deltoid  TREATMENT [facility] Adult PT Treatment:                                                DATE: 08/08/2024 UBE L4 x 5 min (fwd/bwd) to improve endurance and workload capacity Supine serratus punch with 10# 3 x 15 Supine horizontal abduction with blue 3 x 10 Alternating shoulder taps in modified plantigrade with elevated table 3 x 10 each Standing alternating lateral touches with hands on wall and yellow loop at wrists 3 x 10 each Standing shoulder ER/IR with green 2 x 10 Standing scaption with 3# 2 x 10  PATIENT EDUCATION: Education details: HEP Person educated: Patient Education method: Programmer, multimedia, Demonstration, Actor cues, Verbal cues Education comprehension: verbalized understanding, returned demonstration, verbal cues required, tactile cues required, and needs further education  HOME EXERCISE PROGRAM: Access Code: [email or web address]    ASSESSMENT: CLINICAL IMPRESSION: Patient tolerated therapy well with no adverse effects. Therapy focused on progressing right shoulder strength and endurance with good tolerance. He was able to progress with some closed chain shoulder exercises this visit. He did report some posterior shoulder tightness following therapy that is most likely due to continued strength deficit and progressing his exercises this visit. No changes to HEP this visit. Patient would benefit from continued skilled PT to progress mobility and strength in order to reduce pain and maximize functional ability.   Eval: Patient is a 57 y.o. male who was seen today for physical therapy evaluation and treatment for chronic right shoulder pain. His symptoms seem most consistent with rotator cuff related pain with a likely cervical radicular component due to the  right hand and arm numbness. He does exhibit some limitations with his right shoulder motion and rotator cuff strength with pain while testing, tenderness of the right cervical and posterior cuff region with hypomobility of the right shoulder, and report of sensation deficit on the right.   OBJECTIVE IMPAIRMENTS: decreased activity tolerance, decreased ROM, decreased strength, postural dysfunction, and pain.   ACTIVITY LIMITATIONS: carrying, lifting, bathing, reach over head, and hygiene/grooming  PARTICIPATION LIMITATIONS: meal prep, cleaning, community activity, occupation, and yard work  PERSONAL FACTORS: Fitness, Past/current experiences, and Time since onset of injury/illness/exacerbation are also affecting patient's functional outcome.    GOALS: Goals reviewed with patient? Yes  SHORT TERM GOALS: Target date: 06/13/2024  Patient will be I with initial HEP in order to progress with therapy. Baseline: HEP provided at eval 06/13/2024: independent with initial HEP Goal status: MET  2.  Patient will report right shoulder pain </= 5/10 in order to reduce functional limitations Baseline: 8/10 06/13/2024: continues to report right shoulder pain 07/11/2024: 5/10 Goal status: MET  3.  Patient will report 50% improvement in right hand/arm numbness to reduce functional limitations Baseline: patient reports numbness with all overhead activity 06/13/2024: patient report improvement in right hand numbness with activity Goal status: MET  LONG TERM GOALS: Target date: 08/22/2024  Patient will be I with final HEP to maintain progress from PT. Baseline: HEP provided at eval 07/11/2024: progressing banded resistance Goal status: ONGOING  2.  Patient will report PSFS >/= 5 in order to indicate improvement in their functional ability. Baseline: 1.5 07/11/2024: 4.5 Goal status: ONGOING  3.  Patient will demonstrate right shoulder strength 5/5 MMT in order to improve tolerance for overhead  activity Baseline: see strength deficits above 07/11/2024: grossly 4+/5  MMT Goal status: ONGOING  4.  Patient will report right shoulder pain </= 2/10 with all activity in order to reduce functional limitations Baseline: 8/10 07/11/2024: 5/10 Goal status: ONGOING   PLAN: PT FREQUENCY: every other week  PT DURATION: 6 weeks  PLANNED INTERVENTIONS: 97164- PT Re-evaluation, 97750- Physical Performance Testing, 97110-Therapeutic exercises, 97530- Therapeutic activity, 97112- Neuromuscular re-education, 97535- Self Care, 02859- Manual therapy, 20560 (1-2 muscles), 20561 (3+ muscles)- Dry Needling, Patient/Family education, Taping, Joint mobilization, Joint manipulation, Spinal manipulation, Spinal mobilization, Cryotherapy, and Moist heat  PLAN FOR NEXT SESSION: Review HEP and progress PRN, progress rotator cuff and periscapular strengthening and endurance, progress overhead activity   Elaine Daring, PT, DPT, LAT, ATC 08/08/24  11:02 AM Phone: [phone] Fax: [phone]

## 2024-08-12 ENCOUNTER — Other Ambulatory Visit (HOSPITAL_COMMUNITY): Payer: Self-pay

## 2024-08-29 ENCOUNTER — Ambulatory Visit (INDEPENDENT_AMBULATORY_CARE_PROVIDER_SITE_OTHER): Admitting: Physical Therapy

## 2024-08-29 ENCOUNTER — Other Ambulatory Visit: Payer: Self-pay

## 2024-08-29 ENCOUNTER — Encounter: Payer: Self-pay | Admitting: Physical Therapy

## 2024-08-29 DIAGNOSIS — G8929 Other chronic pain: Secondary | ICD-10-CM | POA: Diagnosis not present

## 2024-08-29 DIAGNOSIS — M6281 Muscle weakness (generalized): Secondary | ICD-10-CM

## 2024-08-29 DIAGNOSIS — M25511 Pain in right shoulder: Secondary | ICD-10-CM

## 2024-08-29 NOTE — Patient Instructions (Signed)
 Access Code: [email or web address] URL: [email or web address] Date: 08/29/2024 Prepared by: Elaine Daring  Exercises - Shoulder External Rotation with Anchored Resistance  - 3 x weekly - 3 sets - 10 reps - Single Arm Scaption with Resistance  - 3 x weekly - 3 sets - 10 reps - Standing Row with Anchored Resistance  - 3 x weekly - 3 sets - 10 reps - Standing Serratus Punch with Resistance  - 3 x weekly - 3 sets - 10 reps - Doorway Pec Stretch at 90 Degrees Abduction  - 1 x daily - 3 reps - 20 seconds hold - Step Back Shoulder Stretch with Chair  - 1 x daily - 5 reps - 5 seconds hold

## 2024-08-29 NOTE — Therapy (Signed)
 OUTPATIENT PHYSICAL THERAPY TREATMENT  DISCHARGE   Patient Name: Tanner Gomez MRN: [id number] DOB:[birth year], 58 y.o., male Today's Date: 08/29/2024   END OF SESSION:  PT End of Session - 08/29/24 0757     Visit Number 8    Number of Visits 8    Date for Recertification  08/29/24    Authorization Type [employer]    PT Start Time 0800    PT Stop Time 0845    PT Time Calculation (min) 45 min    Activity Tolerance Patient tolerated treatment well    Behavior During Therapy [facility] for tasks assessed/performed                 Past Medical History:  Diagnosis Date   Anxiety    Arthritis 1 year   Depression    GERD (gastroesophageal reflux disease)    Hyperlipidemia    Hypertension    Pancreatitis    Sleep apnea 10/16/2018   Tobacco abuse    Past Surgical History:  Procedure Laterality Date   EYE SURGERY     RIGHT (CHILDHOOD)   FRACTURE SURGERY     JOINT REPLACEMENT     LEFT HEART CATH AND CORONARY ANGIOGRAPHY N/A 07/30/2023   Procedure: LEFT HEART CATH AND CORONARY ANGIOGRAPHY;  Surgeon: Jordan, Peter M, MD;  Location: [facility];  Service: Cardiovascular;  Laterality: N/A;   Patient Active Problem List   Diagnosis Date Noted   Vertigo 06/15/2024   Allergic rhinitis 06/15/2024   Peripheral edema 04/11/2024   Right shoulder pain 04/11/2024   BPH associated with nocturia 12/16/2023   Bilateral leg pain 12/16/2023   AVN (avascular necrosis of bone) (HCC) 06/16/2023   Erectile dysfunction 06/16/2023   Coronary artery calcification seen on CT scan 06/14/2023   Left hip pain 12/14/2022   Cervical radiculitis 12/15/2021   B12 deficiency 12/13/2021   Hyponatremia 12/13/2021   Leukocytosis 12/13/2021   Elevated TSH 12/12/2020   Vitamin D  deficiency 12/12/2020   Facial cellulitis 08/10/2020   OSA (obstructive sleep apnea) 12/01/2019   Hyperglycemia 11/27/2018   Chest pain 12/15/2017   Back pain 12/15/2017   Encounter for well adult exam with abnormal  findings 06/15/2017   Muscle cramping 06/14/2016   Pancreatitis    Tobacco abuse    GAD (generalized anxiety disorder) 07/16/2012   HTN (hypertension) 11/09/2011   Hypertriglyceridemia 11/09/2011   GERD (gastroesophageal reflux disease) 11/09/2011   LFT elevation 11/09/2011   Depression 11/09/2011   Gout 11/09/2011   Acute pancreatitis 11/09/2011    PCP: Norleen Lynwood LELON, MD  REFERRING PROVIDER: Joane Artist RAMAN, MD  REFERRING DIAG: Pain in joint of right shoulder  THERAPY DIAG:  Chronic right shoulder pain  Muscle weakness (generalized)  Rationale for Evaluation and Treatment: Rehabilitation  ONSET DATE: Chronic   SUBJECTIVE:         SUBJECTIVE STATEMENT: Patient reports his right shoulder was doing pretty good until the cold weather earlier this week and he slept on his right side last night which made his whole right side bother him.   Eval: Patient reports right shoulder pain that has been going on for quite a while that has gotten worse. He reports his arm will go numb like he can't even use it, mainly the more he uses it the muscles just give. It happens quicker when he is doing stuff overhead. He gets a pain in the back of the right shoulder that shoots up to the neck, then his whole arm  goes numb and limp. He did get a shot in the shoulder that helped for a couple of days. He denies any clicking/popping in his shoulder but he can feel cracking in his neck when he moves it. He has been working out with some dumbbells at the house but can't do much with the right arm. The last week or so he has also been having bad cramps in his back, and for the past 3 months it's like he can't get enough sleep.   Hand dominance: Right  PERTINENT HISTORY: See PMH above  PAIN:  Are you having pain? Yes:  NPRS scale: 2/10 currently, 3/10 at worst Pain location: Right shoulder  Pain description: Sharp, shooting, numb Aggravating factors: Any activity involving the right arm Relieving  factors: Rest  PRECAUTIONS: None  PATIENT GOALS: Pain relief   OBJECTIVE:  Note: Objective measures were completed at Evaluation unless otherwise noted. PATIENT SURVEYS:  PSFS: 1.5 Showering and shaving: 2 Working overhead: 1 Cleaning the house: 2 Washing, drying, folding clothes: 1  07/11/2024: PSFS: 4.5 Showering and shaving: 5 Working overhead: 5 Cleaning the house: 5 Washing, drying, folding clothes: 3  08/29/2024: PSFS: 8 Showering and shaving: 9 Working overhead: 8 Cleaning the house: 8 Washing, drying, folding clothes: 7     SENSATION: Patient reports right hand sensation deficit compared to left  POSTURE: Rounded shoulder and forward head posture  CERVICAL ROM:  Cervical ROM grossly WFL and patient does not report any right arm symptoms  UPPER EXTREMITY ROM:   Active ROM Right eval Left eval Rt 8/22/205 Rt 06/13/2024 Right 07/11/2024  Shoulder flexion 150 150  150 150  Shoulder extension       Shoulder abduction 130 145 145 150 150  Shoulder adduction       Shoulder internal rotation L5 T12 L2 L1 L1  Shoulder external rotation 60 / C6 70 / C7     Elbow flexion       Elbow extension       Wrist flexion       Wrist extension       Wrist ulnar deviation       Wrist radial deviation       Wrist pronation       Wrist supination       (Blank rows = not tested)  UPPER EXTREMITY MMT:  MMT Right eval Left eval Right 06/20/2024 Right 07/11/2024 Right 08/08/2024 Right 08/29/2024  Shoulder flexion 4 5 4+ 5  5  Shoulder extension        Shoulder abduction 4+ 5 4+ 4+  4+  Shoulder adduction        Shoulder internal rotation 5 5      Shoulder external rotation 4 5 4+ 4+ 4+ 4+  Middle trapezius        Lower trapezius        Elbow flexion        Elbow extension        Wrist flexion        Wrist extension        Wrist ulnar deviation        Wrist radial deviation        Wrist pronation        Wrist supination        Grip strength (lbs)         (Blank rows = not tested)  Patient reports right shoulder pain with flexion and ER muscle testing  SHOULDER SPECIAL TESTS: Spurling's  on right did elicit some right shoulder pain but denies any change in numbness sensation  JOINT MOBILITY TESTING:  Right shoulder hypomobility  PALPATION:  Tender to palpation right upper trap and levator scap region, posterior cuff and deltoid                                                                                                                             TREATMENT [facility] Adult PT Treatment:                                                DATE: 08/29/2024 UBE L4 x 4 min (fwd/bwd) to improve endurance and workload capacity Corner pec stretch 3 x 20 sec Counter step-back stretch 5 x 5 sec Bent over row supported on table with 20# 3 x 10 each Standing scaption with 4# 3 x 10 Supine chest press with yellow loop at wrists 2 x 10 Supine serratus punch with 10# 2 x 10 Standing shoulder ER/IR with green 2 x 10 each  Discussed HEP and performing banded exercises every other day and stretching as needed   PATIENT EDUCATION: Education details: POC discharge, HEP update Person educated: Patient Education method: Programmer, Multimedia, Demonstration Education comprehension: Verbalized understanding, returned demonstration  HOME EXERCISE PROGRAM: Access Code: [email or web address]    ASSESSMENT: CLINICAL IMPRESSION: Patient tolerated therapy well with no adverse effects. Therapy focused on incorporating some shoulder stretching and continued shoulder strengthening with good tolerance. He reports that onset of cold weather flares up his arthritis and the stretching seemed to help alleviate some of the stiffness. He does report great improvement in his functional status but does continue to exhibit some strength deficits of the right shoulder and continues to report pain but this seems to be more related to weather changes at the moment. Updated his HEP to incorporate  stretching at home. Patient will be formally discharged from PT at this time.   Eval: Patient is a 58 y.o. male who was seen today for physical therapy evaluation and treatment for chronic right shoulder pain. His symptoms seem most consistent with rotator cuff related pain with a likely cervical radicular component due to the right hand and arm numbness. He does exhibit some limitations with his right shoulder motion and rotator cuff strength with pain while testing, tenderness of the right cervical and posterior cuff region with hypomobility of the right shoulder, and report of sensation deficit on the right.   OBJECTIVE IMPAIRMENTS: decreased activity tolerance, decreased ROM, decreased strength, postural dysfunction, and pain.   ACTIVITY LIMITATIONS: carrying, lifting, bathing, reach over head, and hygiene/grooming  PARTICIPATION LIMITATIONS: meal prep, cleaning, community activity, occupation, and yard work  PERSONAL FACTORS: Fitness, Past/current experiences, and Time since onset of injury/illness/exacerbation are also affecting patient's functional outcome.    GOALS: Goals reviewed with patient? Yes  SHORT  TERM GOALS: Target date: 06/13/2024  Patient will be I with initial HEP in order to progress with therapy. Baseline: HEP provided at eval 06/13/2024: independent with initial HEP Goal status: MET  2.  Patient will report right shoulder pain </= 5/10 in order to reduce functional limitations Baseline: 8/10 06/13/2024: continues to report right shoulder pain 07/11/2024: 5/10 Goal status: MET  3.  Patient will report 50% improvement in right hand/arm numbness to reduce functional limitations Baseline: patient reports numbness with all overhead activity 06/13/2024: patient report improvement in right hand numbness with activity Goal status: MET  LONG TERM GOALS: Target date: 08/29/2024  Patient will be I with final HEP to maintain progress from PT. Baseline: HEP provided at  eval 07/11/2024: progressing banded resistance 08/29/2024: independent Goal status: MET  2.  Patient will report PSFS >/= 5 in order to indicate improvement in their functional ability. Baseline: 1.5 07/11/2024: 4.5 08/29/2024: 8 Goal status: MET  3.  Patient will demonstrate right shoulder strength 5/5 MMT in order to improve tolerance for overhead activity Baseline: see strength deficits above 07/11/2024: grossly 4+/5 MMT 08/29/2024: see above Goal status: PARTIALLY MET  4.  Patient will report right shoulder pain </= 2/10 with all activity in order to reduce functional limitations Baseline: 8/10 07/11/2024: 5/10 08/29/2024: 3/10 Goal status: PARTIALLY MET   PLAN: PT FREQUENCY: every other week  PT DURATION: 6 weeks  PLANNED INTERVENTIONS: 97164- PT Re-evaluation, 97750- Physical Performance Testing, 97110-Therapeutic exercises, 97530- Therapeutic activity, 97112- Neuromuscular re-education, 97535- Self Care, 02859- Manual therapy, 20560 (1-2 muscles), 20561 (3+ muscles)- Dry Needling, Patient/Family education, Taping, Joint mobilization, Joint manipulation, Spinal manipulation, Spinal mobilization, Cryotherapy, and Moist heat  PLAN FOR NEXT SESSION: NA - discharge   Elaine Daring, PT, DPT, LAT, ATC 08/29/24  7:57 AM Phone: [phone] Fax: [phone]  PHYSICAL THERAPY DISCHARGE SUMMARY  Visits from Start of Care: 8  Current functional level related to goals / functional outcomes: See above   Remaining deficits: See above   Education / Equipment: HEP   Patient agrees to discharge. Patient goals were partially met. Patient is being discharged due to being pleased with the current functional level.

## 2024-09-01 ENCOUNTER — Encounter: Payer: Self-pay | Admitting: Internal Medicine

## 2024-09-03 ENCOUNTER — Other Ambulatory Visit: Payer: Self-pay | Admitting: Internal Medicine

## 2024-09-03 ENCOUNTER — Other Ambulatory Visit: Payer: Self-pay

## 2024-09-15 ENCOUNTER — Other Ambulatory Visit (HOSPITAL_COMMUNITY): Payer: Self-pay

## 2024-09-15 ENCOUNTER — Observation Stay (HOSPITAL_COMMUNITY)

## 2024-09-15 ENCOUNTER — Other Ambulatory Visit: Payer: Self-pay

## 2024-09-15 ENCOUNTER — Telehealth (HOSPITAL_COMMUNITY): Payer: Self-pay | Admitting: Pharmacy Technician

## 2024-09-15 ENCOUNTER — Encounter (HOSPITAL_COMMUNITY): Payer: Self-pay

## 2024-09-15 ENCOUNTER — Emergency Department (HOSPITAL_COMMUNITY)

## 2024-09-15 ENCOUNTER — Observation Stay (HOSPITAL_COMMUNITY)
Admission: EM | Admit: 2024-09-15 | Discharge: 2024-09-15 | Disposition: A | Discharge status: Home / Self Care | Attending: Emergency Medicine | Admitting: Emergency Medicine

## 2024-09-15 DIAGNOSIS — E782 Mixed hyperlipidemia: Secondary | ICD-10-CM | POA: Diagnosis not present

## 2024-09-15 DIAGNOSIS — R0789 Other chest pain: Secondary | ICD-10-CM | POA: Diagnosis not present

## 2024-09-15 DIAGNOSIS — R079 Chest pain, unspecified: Secondary | ICD-10-CM | POA: Diagnosis present

## 2024-09-15 DIAGNOSIS — I10 Essential (primary) hypertension: Secondary | ICD-10-CM | POA: Diagnosis not present

## 2024-09-15 DIAGNOSIS — Z79899 Other long term (current) drug therapy: Secondary | ICD-10-CM | POA: Diagnosis not present

## 2024-09-15 DIAGNOSIS — I2693 Single subsegmental pulmonary embolism without acute cor pulmonale: Principal | ICD-10-CM

## 2024-09-15 DIAGNOSIS — F17210 Nicotine dependence, cigarettes, uncomplicated: Secondary | ICD-10-CM | POA: Diagnosis not present

## 2024-09-15 DIAGNOSIS — R0602 Shortness of breath: Secondary | ICD-10-CM | POA: Diagnosis present

## 2024-09-15 DIAGNOSIS — K219 Gastro-esophageal reflux disease without esophagitis: Secondary | ICD-10-CM | POA: Diagnosis present

## 2024-09-15 DIAGNOSIS — E785 Hyperlipidemia, unspecified: Secondary | ICD-10-CM | POA: Diagnosis not present

## 2024-09-15 DIAGNOSIS — Z8679 Personal history of other diseases of the circulatory system: Secondary | ICD-10-CM

## 2024-09-15 DIAGNOSIS — Z7901 Long term (current) use of anticoagulants: Secondary | ICD-10-CM | POA: Insufficient documentation

## 2024-09-15 DIAGNOSIS — F1090 Alcohol use, unspecified, uncomplicated: Secondary | ICD-10-CM | POA: Diagnosis not present

## 2024-09-15 DIAGNOSIS — Z7985 Long-term (current) use of injectable non-insulin antidiabetic drugs: Secondary | ICD-10-CM | POA: Diagnosis not present

## 2024-09-15 DIAGNOSIS — D72829 Elevated white blood cell count, unspecified: Secondary | ICD-10-CM | POA: Diagnosis present

## 2024-09-15 DIAGNOSIS — F411 Generalized anxiety disorder: Secondary | ICD-10-CM | POA: Diagnosis present

## 2024-09-15 DIAGNOSIS — I2699 Other pulmonary embolism without acute cor pulmonale: Secondary | ICD-10-CM

## 2024-09-15 LAB — CBC WITH DIFFERENTIAL/PLATELET
Abs Immature Granulocytes: 0.1 K/uL — ABNORMAL HIGH (ref 0.00–0.07)
Basophils Absolute: 0.1 K/uL (ref 0.0–0.1)
Basophils Relative: 0 %
Eosinophils Absolute: 0.2 K/uL (ref 0.0–0.5)
Eosinophils Relative: 1 %
HCT: 41.9 % (ref 39.0–52.0)
Hemoglobin: 13.8 g/dL (ref 13.0–17.0)
Immature Granulocytes: 1 %
Lymphocytes Relative: 18 %
Lymphs Abs: 3.3 K/uL (ref 0.7–4.0)
MCH: 30.3 pg (ref 26.0–34.0)
MCHC: 32.9 g/dL (ref 30.0–36.0)
MCV: 91.9 fL (ref 80.0–100.0)
Monocytes Absolute: 1.3 K/uL — ABNORMAL HIGH (ref 0.1–1.0)
Monocytes Relative: 7 %
Neutro Abs: 12.8 K/uL — ABNORMAL HIGH (ref 1.7–7.7)
Neutrophils Relative %: 73 %
Platelets: 292 K/uL (ref 150–400)
RBC: 4.56 MIL/uL (ref 4.22–5.81)
RDW: 13.6 % (ref 11.5–15.5)
WBC: 17.8 K/uL — ABNORMAL HIGH (ref 4.0–10.5)
nRBC: 0 % (ref 0.0–0.2)

## 2024-09-15 LAB — HEPARIN LEVEL (UNFRACTIONATED): Heparin Unfractionated: 0.32 [IU]/mL (ref 0.30–0.70)

## 2024-09-15 LAB — BASIC METABOLIC PANEL WITH GFR
Anion gap: 13 (ref 5–15)
BUN: 17 mg/dL (ref 6–20)
CO2: 22 mmol/L (ref 22–32)
Calcium: 9.5 mg/dL (ref 8.9–10.3)
Chloride: 100 mmol/L (ref 98–111)
Creatinine, Ser: 1.22 mg/dL (ref 0.61–1.24)
GFR, Estimated: >60 mL/min (ref >60)
Glucose, Bld: 136 mg/dL — ABNORMAL HIGH (ref 70–99)
Potassium: 3.9 mmol/L (ref 3.5–5.1)
Sodium: 135 mmol/L (ref 135–145)

## 2024-09-15 LAB — PHOSPHORUS: Phosphorus: 3.2 mg/dL (ref 2.5–4.6)

## 2024-09-15 LAB — TROPONIN I (HIGH SENSITIVITY)
Troponin I (High Sensitivity): 4 ng/L (ref <18)
Troponin I (High Sensitivity): 4 ng/L (ref <18)

## 2024-09-15 LAB — ECHOCARDIOGRAM COMPLETE
Area-P 1/2: 3.5 cm2
Calc EF: 66.1 %
Height: 69 in
S' Lateral: 3.55 cm
Single Plane A2C EF: 70.2 %
Single Plane A4C EF: 66 %
Weight: 4000 [oz_av]

## 2024-09-15 LAB — BRAIN NATRIURETIC PEPTIDE: B Natriuretic Peptide: 15.6 pg/mL (ref 0.0–100.0)

## 2024-09-15 LAB — GLUCOSE, CAPILLARY: Glucose-Capillary: 125 mg/dL — ABNORMAL HIGH (ref 70–99)

## 2024-09-15 LAB — MAGNESIUM: Magnesium: 1.4 mg/dL — ABNORMAL LOW (ref 1.7–2.4)

## 2024-09-15 MED ORDER — APIXABAN 5 MG PO TABS
10.0000 mg | ORAL_TABLET | Freq: Two times a day (BID) | ORAL | Status: DC
  Administered 2024-09-15 (×2): 10 mg via ORAL
  Filled 2024-09-15 (×2): qty 2

## 2024-09-15 MED ORDER — PERFLUTREN LIPID MICROSPHERE
1.0000 mL | INTRAVENOUS | Status: AC | PRN
  Administered 2024-09-15: 2 mL via INTRAVENOUS

## 2024-09-15 MED ORDER — IOHEXOL 350 MG/ML SOLN
125.0000 mL | Freq: Once | INTRAVENOUS | Status: AC | PRN
  Administered 2024-09-15: 125 mL via INTRAVENOUS

## 2024-09-15 MED ORDER — SODIUM CHLORIDE 0.9 % IV BOLUS
1000.0000 mL | Freq: Once | INTRAVENOUS | Status: AC
  Administered 2024-09-15: 1000 mL via INTRAVENOUS

## 2024-09-15 MED ORDER — SODIUM CHLORIDE 0.9 % IV SOLN: INTRAVENOUS | Status: AC

## 2024-09-15 MED ORDER — HYDROCODONE-ACETAMINOPHEN 5-325 MG PO TABS
1.0000 | ORAL_TABLET | ORAL | Status: DC | PRN
  Administered 2024-09-15: 1 via ORAL
  Filled 2024-09-15: qty 1

## 2024-09-15 MED ORDER — ALPRAZOLAM 0.5 MG PO TABS: 1.0000 mg | ORAL_TABLET | Freq: Three times a day (TID) | ORAL | Status: DC | PRN

## 2024-09-15 MED ORDER — ONDANSETRON HCL 4 MG PO TABS: 4.0000 mg | ORAL_TABLET | Freq: Every day | ORAL | 1 refills | Status: AC | PRN

## 2024-09-15 MED ORDER — METHOCARBAMOL 1000 MG/10ML IJ SOLN
500.0000 mg | Freq: Once | INTRAMUSCULAR | Status: AC
  Administered 2024-09-15: 500 mg via INTRAVENOUS
  Filled 2024-09-15: qty 10

## 2024-09-15 MED ORDER — APIXABAN (ELIQUIS) VTE STARTER PACK (10MG AND 5MG)
ORAL_TABLET | ORAL | 0 refills | Status: AC
  Filled 2024-09-15: qty 74, 30d supply, fill #0

## 2024-09-15 MED ORDER — MAGNESIUM SULFATE 2 GM/50ML IV SOLN
2.0000 g | Freq: Once | INTRAVENOUS | Status: AC
  Administered 2024-09-15: 2 g via INTRAVENOUS
  Filled 2024-09-15: qty 50

## 2024-09-15 MED ORDER — HYDROCODONE-ACETAMINOPHEN 5-325 MG PO TABS
1.0000 | ORAL_TABLET | ORAL | 0 refills | Status: AC | PRN
  Filled 2024-09-15: qty 30, 3d supply, fill #0

## 2024-09-15 MED ORDER — SODIUM CHLORIDE 0.9 % IV BOLUS
500.0000 mL | Freq: Once | INTRAVENOUS | Status: AC
  Administered 2024-09-15: 500 mL via INTRAVENOUS

## 2024-09-15 MED ORDER — HYDROCODONE-ACETAMINOPHEN 5-325 MG PO TABS: 1.0000 | ORAL_TABLET | ORAL | Status: DC | PRN

## 2024-09-15 MED ORDER — FENTANYL CITRATE (PF) 50 MCG/ML IJ SOSY
25.0000 ug | PREFILLED_SYRINGE | INTRAMUSCULAR | Status: DC | PRN
  Administered 2024-09-15 (×2): 25 ug via INTRAVENOUS
  Filled 2024-09-15 (×2): qty 1

## 2024-09-15 MED ORDER — HEPARIN (PORCINE) 25000 UT/250ML-% IV SOLN
1700.0000 [IU]/h | INTRAVENOUS | Status: DC
  Administered 2024-09-15: 1700 [IU]/h via INTRAVENOUS
  Filled 2024-09-15: qty 250

## 2024-09-15 MED ORDER — NALOXONE HCL 0.4 MG/ML IJ SOLN: 0.4000 mg | INTRAMUSCULAR | Status: DC | PRN

## 2024-09-15 MED ORDER — APIXABAN 5 MG PO TABS: 5.0000 mg | ORAL_TABLET | Freq: Two times a day (BID) | ORAL | Status: DC

## 2024-09-15 MED ORDER — ASPIRIN 81 MG PO TBEC
81.0000 mg | DELAYED_RELEASE_TABLET | Freq: Every day | ORAL | Status: DC
  Administered 2024-09-15: 81 mg via ORAL
  Filled 2024-09-15: qty 1

## 2024-09-15 MED ORDER — ACETAMINOPHEN 650 MG RE SUPP: 650.0000 mg | Freq: Four times a day (QID) | RECTAL | Status: DC | PRN

## 2024-09-15 MED ORDER — ONDANSETRON HCL 4 MG/2ML IJ SOLN
4.0000 mg | Freq: Once | INTRAMUSCULAR | Status: AC
  Administered 2024-09-15: 4 mg via INTRAVENOUS
  Filled 2024-09-15: qty 2

## 2024-09-15 MED ORDER — ATORVASTATIN CALCIUM 80 MG PO TABS
80.0000 mg | ORAL_TABLET | Freq: Every day | ORAL | Status: DC
  Administered 2024-09-15: 80 mg via ORAL
  Filled 2024-09-15: qty 1

## 2024-09-15 MED ORDER — FAMOTIDINE 20 MG PO TABS: 20.0000 mg | ORAL_TABLET | Freq: Once | ORAL | Status: DC

## 2024-09-15 MED ORDER — MELATONIN 3 MG PO TABS: 3.0000 mg | ORAL_TABLET | Freq: Every evening | ORAL | Status: DC | PRN

## 2024-09-15 MED ORDER — PANTOPRAZOLE SODIUM 40 MG PO TBEC
40.0000 mg | DELAYED_RELEASE_TABLET | Freq: Every day | ORAL | Status: DC
  Administered 2024-09-15: 40 mg via ORAL
  Filled 2024-09-15: qty 1

## 2024-09-15 MED ORDER — ACETAMINOPHEN 325 MG PO TABS: 650.0000 mg | ORAL_TABLET | Freq: Four times a day (QID) | ORAL | Status: DC | PRN

## 2024-09-15 MED ORDER — FENOFIBRATE 160 MG PO TABS
160.0000 mg | ORAL_TABLET | Freq: Every day | ORAL | Status: DC
  Administered 2024-09-15: 160 mg via ORAL
  Filled 2024-09-15: qty 1

## 2024-09-15 MED ORDER — ONDANSETRON HCL 4 MG/2ML IJ SOLN
4.0000 mg | Freq: Four times a day (QID) | INTRAMUSCULAR | Status: DC | PRN
  Administered 2024-09-15: 4 mg via INTRAVENOUS
  Filled 2024-09-15: qty 2

## 2024-09-15 MED ORDER — HYDROCODONE-ACETAMINOPHEN 5-325 MG PO TABS
1.0000 | ORAL_TABLET | ORAL | Status: DC | PRN
  Administered 2024-09-15 (×2): 2 via ORAL
  Filled 2024-09-15 (×2): qty 2

## 2024-09-15 MED ORDER — ICOSAPENT ETHYL 1 G PO CAPS
2.0000 g | ORAL_CAPSULE | Freq: Two times a day (BID) | ORAL | Status: DC
  Administered 2024-09-15: 2 g via ORAL
  Filled 2024-09-15 (×2): qty 2

## 2024-09-15 MED ORDER — ASPIRIN 81 MG PO CHEW: 324.0000 mg | CHEWABLE_TABLET | Freq: Once | ORAL | Status: DC

## 2024-09-15 MED ORDER — MORPHINE SULFATE (PF) 4 MG/ML IV SOLN
4.0000 mg | Freq: Once | INTRAVENOUS | Status: AC
  Administered 2024-09-15: 4 mg via INTRAVENOUS
  Filled 2024-09-15: qty 1

## 2024-09-15 MED ORDER — HEPARIN BOLUS VIA INFUSION
5000.0000 [IU] | Freq: Once | INTRAVENOUS | Status: AC
  Administered 2024-09-15: 5000 [IU] via INTRAVENOUS
  Filled 2024-09-15: qty 5000

## 2024-09-15 NOTE — TOC CM/SW Note (Signed)
 CM received request to act as patient advocate. Met with patient and friend, Devere, at bedside to discuss their concerns over patient discharge. Both patient and friend expressed displeasure/disappointment with the care received and communication regarding discharge. Per patient, he was told by one MD that he would be staying for continued observation and told by an alternate MD that he was being discharged. Patient reports that he still is having pain and feels dizzy. He also reported that he had an episode of emesis earlier after attempting to eat. Per patient/friend, these symptoms were reported to MD who decided to uphold discharge.  CM offered to discuss further with MD. However, this was declined. Patient was encouraged to return to the ED should symptoms persist. His friend, Devere, will be providing patient d/c transport and having patient stay with her for patient safety and care.   CM contact information provided should patient/friend have further concerns.   Merilee Batty, MSN, RN Case Management [phone]

## 2024-09-15 NOTE — ED Notes (Signed)
 Pt states pain has increased d/t moving in bed.

## 2024-09-15 NOTE — Discharge Summary (Signed)
 Physician Discharge Summary  Tanner Gomez FMW:[id number] DOB: [birth year] DOA: 09/15/2024  PCP: Norleen Lynwood LELON, MD  Admit date: 09/15/2024 Discharge date: 09/15/2024  Admitted From: Home Disposition: Home  Recommendations for Outpatient Follow-up:  Follow up with PCP in 1-2 weeks  Home Health: None Equipment/Devices: None  Discharge Condition: Stable CODE STATUS:Full  Diet recommendation: Low-salt low-fat low-carb diet  Brief/Interim Summary: Tanner Gomez is a 58 y.o. male with medical history significant for GERD, send hypertension, hyperlipidemia, generalized anxiety disorder, who is admitted to [facility] on 09/15/2024 with acute pulmonary embolism after presenting from home to [facility] ED complaining of right-sided chest pain.  Patient notably been traveling to [place]  via airplane with worsening lower extremity edema and swelling over the past 2 weeks subsequently culminating in a ground-level fall while in [place]  with with negative workup at that time.  Patient presented here with worsening chest pain, imaging confirmed segmental branch of the right upper lobe pulmonary embolism.  DVT studies negative, CTA negative for heart strain.  Echocardiogram completed and no notable heart strain - EF WNL. At this time per patient continues to improve at this time with improving chest pain, ambulating without any difficulty dyspnea or hypoxia. Patient does have notably low blood pressure but per chart review appears to be consistent with his baseline.  Patient has been transition from heparin  as been transition from heparin  drip to Eliquis  and is otherwise stable and agreeable for discharge home.  Close follow-up in 1 to close follow-up in 1 to 2 weeks with PCP and close follow-up in 1 to 2 weeks with PCP as discussed, also discussed risks of bleeding and need for return to hospital should he have any further fall further falls, head trauma or dark maroon-red stools/emesis.  Discharge Diagnoses:   Principal Problem:   Acute pulmonary embolism (HCC) Active Problems:   HLD (hyperlipidemia)   GERD (gastroesophageal reflux disease)   GAD (generalized anxiety disorder)   Right-sided chest pain   Leukocytosis   History of essential hypertension  Acute provoked PE, right segmental branch, POA - Transition to Eliquis  from heparin  - no right heart strain on CTA - echo without overt findings of heart strain,  DVT study negative - Continue outpatient follow up and repeat imaging per protocol in 3-6 months with PCP  Discharge Instructions   Allergies as of 09/15/2024       Reactions   Sular Swelling   nisoldipine   Ibuprofen  Other (See Comments)   GI Bleed        Medication List     STOP taking these medications    aspirin  EC 81 MG tablet   atenolol  100 MG tablet Commonly known as: TENORMIN    celecoxib  200 MG capsule Commonly known as: CeleBREX    valsartan  160 MG tablet Commonly known as: DIOVAN        TAKE these medications    ALPRAZolam  1 MG tablet Commonly known as: XANAX  TAKE 1 TABLET BY MOUTH THREE TIMES A DAY AS NEEDED   apixaban  5 MG Tabs tablet Commonly known as: ELIQUIS  Take 2 tablets (10 mg total) by mouth 2 (two) times daily for 7 days, THEN 1 tablet (5 mg total) 2 (two) times daily. Start taking on: September 15, 2024   ascorbic acid 500 MG tablet Commonly known as: VITAMIN C Take 500 mg by mouth in the morning.   atorvastatin  80 MG tablet Commonly known as: LIPITOR  TAKE 1 TABLET BY MOUTH EVERY DAY   baclofen  10 MG tablet  Commonly known as: LIORESAL  Take 1 tablet (10 mg total) by mouth 3 (three) times daily.   Coenzyme Q10 100 MG capsule Take 100 mg by mouth daily.   cyanocobalamin  500 MCG tablet Commonly known as: VITAMIN B12 Take 500 mcg by mouth daily.   fenofibrate  160 MG tablet TAKE 1 TABLET BY MOUTH EVERY DAY   HYDROcodone -acetaminophen  5-325 MG tablet Commonly known as: NORCO/VICODIN Take 1-2 tablets by mouth every 4 (four)  hours as needed for severe pain (pain score 7-10).   icosapent  Ethyl 1 g capsule Commonly known as: VASCEPA  TAKE 2 CAPSULES BY MOUTH 2 TIMES DAILY.   magnesium  gluconate 500 MG tablet Commonly known as: MAGONATE Take 500 mg by mouth daily.   meclizine  12.5 MG tablet Commonly known as: ANTIVERT  TAKE 1 TABLET BY MOUTH 3 TIMES DAILY AS NEEDED.   Mounjaro  2.5 MG/0.5ML Pen Generic drug: tirzepatide  Inject 2.5 mg into the skin once a week. What changed: when to take this   multivitamin with minerals Tabs tablet Take 2 tablets by mouth daily.   omeprazole  40 MG capsule Commonly known as: PRILOSEC TAKE 1 CAPSULE BY MOUTH EVERY DAY   sildenafil  100 MG tablet Commonly known as: Viagra  Take 0.5-1 tablets (50-100 mg total) by mouth daily as needed for erectile dysfunction.   tamsulosin  0.4 MG Caps capsule Commonly known as: FLOMAX  Take 1 capsule (0.4 mg total) by mouth daily.   Vitamin D3 250 MCG (10000 UT) capsule Take 20,000 Units by mouth daily.        Allergies  Allergen Reactions   Sular Swelling    nisoldipine   Ibuprofen  Other (See Comments)    GI Bleed    Consultations: None   Procedures/Studies: ECHOCARDIOGRAM COMPLETE Result Date: 09/15/2024    ECHOCARDIOGRAM REPORT   Patient Name:   Tanner Gomez Date of Exam: 09/15/2024 Medical Rec #:  [id number]    Height:       69.0 in Accession #:    [id number]   Weight:       250.0 lb Date of Birth:  1966/08/11   BSA:          2.272 m Patient Age:    58 years     BP:           94/45 mmHg Patient Gender: M            HR:           68 bpm. Exam Location:  Inpatient Procedure: 2D Echo, Color Doppler, Cardiac Doppler and Intracardiac            Opacification Agent (Both Spectral and Color Flow Doppler were            utilized during procedure). Indications:    I26.02 Pulmonary embolus  History:        Patient has no prior history of Echocardiogram examinations.                 Signs/Symptoms:Shortness of Breath and Dyspnea; Risk                  Factors:Hypertension, Dyslipidemia, Current Smoker and Sleep                 Apnea.  Sonographer:    Ellouise Mose RDCS Referring Phys: [id number] EVA KATHEE PORE  Sonographer Comments: Technically difficult study due to poor echo windows and patient is obese. Image acquisition challenging due to patient body habitus. IMPRESSIONS  1. Left ventricular ejection fraction, by estimation,  is 60 to 65%. The left ventricle has normal function. The left ventricle has no regional wall motion abnormalities. Left ventricular diastolic parameters were normal.  2. Right ventricular systolic function is normal. The right ventricular size is mildly enlarged.  3. The mitral valve is grossly normal. No evidence of mitral valve regurgitation. No evidence of mitral stenosis.  4. The aortic valve is grossly normal. There is mild calcification of the aortic valve. There is mild thickening of the aortic valve. Aortic valve regurgitation is not visualized. Aortic valve sclerosis is present, with no evidence of aortic valve stenosis.  5. The inferior vena cava is normal in size with greater than 50% respiratory variability, suggesting right atrial pressure of 3 mmHg. Comparison(s): No prior Echocardiogram. Conclusion(s)/Recommendation(s): Otherwise normal echocardiogram, with minor abnormalities described in the report. FINDINGS  Left Ventricle: Left ventricular ejection fraction, by estimation, is 60 to 65%. The left ventricle has normal function. The left ventricle has no regional wall motion abnormalities. Definity contrast agent was given IV to delineate the left ventricular  endocardial borders. The left ventricular internal cavity size was normal in size. There is no left ventricular hypertrophy. Left ventricular diastolic parameters were normal. Right Ventricle: The right ventricular size is mildly enlarged. No increase in right ventricular wall thickness. Right ventricular systolic function is normal. Left Atrium: Left  atrial size was normal in size. Right Atrium: Right atrial size was normal in size. Pericardium: There is no evidence of pericardial effusion. Presence of epicardial fat layer. Mitral Valve: The mitral valve is grossly normal. No evidence of mitral valve regurgitation. No evidence of mitral valve stenosis. Tricuspid Valve: The tricuspid valve is not well visualized. Tricuspid valve regurgitation is trivial. No evidence of tricuspid stenosis. Aortic Valve: The aortic valve is grossly normal. There is mild calcification of the aortic valve. There is mild thickening of the aortic valve. Aortic valve regurgitation is not visualized. Aortic valve sclerosis is present, with no evidence of aortic valve stenosis. Pulmonic Valve: The pulmonic valve was not well visualized. Pulmonic valve regurgitation is not visualized. No evidence of pulmonic stenosis. Aorta: The aortic root and ascending aorta are structurally normal, with no evidence of dilitation. Venous: The inferior vena cava is normal in size with greater than 50% respiratory variability, suggesting right atrial pressure of 3 mmHg. IAS/Shunts: No atrial level shunt detected by color flow Doppler.  LEFT VENTRICLE PLAX 2D LVIDd:         5.70 cm      Diastology LVIDs:         3.55 cm      LV e' medial:    8.81 cm/s LV PW:         1.10 cm      LV E/e' medial:  8.9 LV IVS:        1.20 cm      LV e' lateral:   11.40 cm/s LVOT diam:     2.40 cm      LV E/e' lateral: 6.9 LV SV:         107 LV SV Index:   47 LVOT Area:     4.52 cm  LV Volumes (MOD) LV vol d, MOD A2C: 61.5 ml LV vol d, MOD A4C: 118.0 ml LV vol s, MOD A2C: 18.3 ml LV vol s, MOD A4C: 40.1 ml LV SV MOD A2C:     43.2 ml LV SV MOD A4C:     118.0 ml LV SV MOD BP:  57.9 ml RIGHT VENTRICLE             IVC RV Basal diam:  4.29 cm     IVC diam: 2.00 cm RV S prime:     11.60 cm/s TAPSE (M-mode): 2.2 cm LEFT ATRIUM           Index        RIGHT ATRIUM           Index LA diam:      3.90 cm 1.72 cm/m   RA Area:      18.70 cm LA Vol (A2C): 25.9 ml 11.40 ml/m  RA Volume:   47.70 ml  21.00 ml/m LA Vol (A4C): 51.9 ml 22.85 ml/m  AORTIC VALVE LVOT Vmax:   116.00 cm/s LVOT Vmean:  72.700 cm/s LVOT VTI:    0.237 m  AORTA Ao Root diam: 3.20 cm Ao Asc diam:  3.30 cm MITRAL VALVE MV Area (PHT): 3.50 cm    SHUNTS MV Decel Time: 217 msec    Systemic VTI:  0.24 m MV E velocity: 78.62 cm/s  Systemic Diam: 2.40 cm MV A velocity: 77.84 cm/s MV E/A ratio:  1.01 Shelda Bruckner MD Electronically signed by Shelda Bruckner MD Signature Date/Time: 09/15/2024/1:49:17 PM    Final    VAS US  LOWER EXTREMITY VENOUS (DVT) Result Date: 09/15/2024  Lower Venous DVT Study Patient Name:  Tanner Gomez  Date of Exam:   09/15/2024 Medical Rec #: [id number]     Accession #:    [id number] Date of Birth: [birth year]    Patient Gender: M Patient Age:   23 years Exam Location:  [facility] [place] [facility] Procedure:      VAS US  LOWER EXTREMITY VENOUS (DVT) Referring Phys: JUSTIN HOWERTER --------------------------------------------------------------------------------  Indications: Pulmonary embolism.  Comparison Study: No previous exams Performing Technologist: Jody Hill RVT, RDMS  Examination Guidelines: A complete evaluation includes B-mode imaging, spectral Doppler, color Doppler, and power Doppler as needed of all accessible portions of each vessel. Bilateral testing is considered an integral part of a complete examination. Limited examinations for reoccurring indications may be performed as noted. The reflux portion of the exam is performed with the patient in reverse Trendelenburg.  +---------+---------------+---------+-----------+----------+--------------+ RIGHT    CompressibilityPhasicitySpontaneityPropertiesThrombus Aging +---------+---------------+---------+-----------+----------+--------------+ CFV      Full           Yes      Yes                                  +---------+---------------+---------+-----------+----------+--------------+ SFJ      Full                                                        +---------+---------------+---------+-----------+----------+--------------+ FV Prox  Full           Yes      Yes                                 +---------+---------------+---------+-----------+----------+--------------+ FV Mid   Full           Yes      Yes                                 +---------+---------------+---------+-----------+----------+--------------+  FV DistalFull           Yes      Yes                                 +---------+---------------+---------+-----------+----------+--------------+ PFV      Full                                                        +---------+---------------+---------+-----------+----------+--------------+ POP      Full           Yes      Yes                                 +---------+---------------+---------+-----------+----------+--------------+ PTV      Full                                                        +---------+---------------+---------+-----------+----------+--------------+ PERO     Full                                                        +---------+---------------+---------+-----------+----------+--------------+   +---------+---------------+---------+-----------+----------+--------------+ LEFT     CompressibilityPhasicitySpontaneityPropertiesThrombus Aging +---------+---------------+---------+-----------+----------+--------------+ CFV      Full           Yes      Yes                                 +---------+---------------+---------+-----------+----------+--------------+ SFJ      Full                                                        +---------+---------------+---------+-----------+----------+--------------+ FV Prox  Full           Yes      Yes                                  +---------+---------------+---------+-----------+----------+--------------+ FV Mid   Full           Yes      Yes                                 +---------+---------------+---------+-----------+----------+--------------+ FV DistalFull           Yes      Yes                                 +---------+---------------+---------+-----------+----------+--------------+ PFV      Full                                                        +---------+---------------+---------+-----------+----------+--------------+  POP      Full           Yes      Yes                                 +---------+---------------+---------+-----------+----------+--------------+ PTV      Full                                                        +---------+---------------+---------+-----------+----------+--------------+ PERO     Full                                                        +---------+---------------+---------+-----------+----------+--------------+     Summary: BILATERAL: - No evidence of deep vein thrombosis seen in the lower extremities, bilaterally. -No evidence of popliteal cyst, bilaterally. RIGHT: - Ultrasound characteristics of enlarged lymph nodes are noted in the groin.  LEFT: - Ultrasound characteristics of a ruptured Baker's Cyst are noted.  *See table(s) above for measurements and observations. Electronically signed by Debby Robertson on 09/15/2024 at 9:16:09 AM.    Final    CT Angio Chest Pulmonary Embolism (PE) W or WO Contrast Result Date: 09/15/2024 EXAM: CTA CHEST 09/15/2024 03:24:11 AM TECHNIQUE: CTA of the chest was performed after the administration of 125 mL of iohexol  (OMNIPAQUE ) 350 MG/ML injection. Multiplanar reformatted images are provided for review. MIP images are provided for review. Automated exposure control, iterative reconstruction, and/or weight based adjustment of the mA/kV was utilized to reduce the radiation dose to as low as reasonably achievable.  COMPARISON: Portable chest from today, chest radiograph 12/12/2017, and chest CT for low dose lung cancer screening protocol from 05/30/2024 and 05/28/2023. CLINICAL HISTORY: Pulmonary embolism (PE) suspected, high prob. Chest pain and shortness of breath. FINDINGS: PULMONARY ARTERIES: Pulmonary arteries are adequately opacified for evaluation. There is an embolic filling defect in a single subsegmental branch artery in the posterior segment of the right upper lobe, best seen on series 7 axial 48. No further arterial emboli are seen. There is no arterial dilatation. No findings of an acute right heart strain. MEDIASTINUM: The heart is slightly enlarged. The pericardium demonstrates no acute abnormality. There is moderate atherosclerosis in the aorta and great vessels without aneurysm, stenosis, or dissection. There are left main and heavy for age 79-vessel coronary artery calcifications. The pulmonary veins are nondilated. LYMPH NODES: No mediastinal, hilar or axillary lymphadenopathy. LUNGS AND PLEURA: There is respiratory motion limiting fine detail in the lungs. There is mild diffuse bronchial thickening. Mild apical centrilobular emphysema. There is diffuse mosaic attenuation of the lungs consistent with air trapping likely related to small airway disease. There is no consolidation, effusion, or pneumothorax or visible nodules. UPPER ABDOMEN: In the abdomen the liver is moderately steatotic with enlargement, with no acute findings. SOFT TISSUES AND BONES: There are mild features of thoracic spondylosis. No acute or significant osseous findings. No mass in the visualized chest wall. No acute soft tissue abnormality. IMPRESSION: 1. Acute subsegmental pulmonary embolism in a solitary posterior segmental branch of the right upper lobe, without CT evidence of right heart strain. 2.  No other acute chest CTA or CT findings . 3. Left main and heavy for age 58 vessel coronary calcifications. Aortic atherosclerosis. 4.  Diffuse bronchial thickening and mosaicism. No focal pneumonic infiltrate. 5. Moderately steatotic liver. Electronically signed by: Francis Quam MD 09/15/2024 03:53 AM EST RP Workstation: [id number]   DG Chest Port 1 View Result Date: 09/15/2024 EXAM: 1 VIEW XRAY OF THE CHEST 09/15/2024 02:28:33 AM COMPARISON: 2 / 27 / 19. CLINICAL HISTORY: chest pain, shortness of breath FINDINGS: LUNGS AND PLEURA: Low lung volumes. No focal pulmonary opacity. No pleural effusion. No pneumothorax. HEART AND MEDIASTINUM: Aortic atherosclerosis. BONES AND SOFT TISSUES: No acute osseous abnormality. IMPRESSION: 1. No acute cardiopulmonary process. Electronically signed by: Norman Gatlin MD 09/15/2024 02:43 AM EST RP Workstation: [id number]     Subjective: No acute issues/events overnight - pleuritic chest pain resolving   Discharge Exam: Vitals:   09/15/24 1100 09/15/24 1356  BP: (!) 93/53 (!) 112/46  Pulse: 67 67  Resp: 12 18  Temp:  97.7 F (36.5 C)  SpO2: 96% 93%   Vitals:   09/15/24 0909 09/15/24 0954 09/15/24 1100 09/15/24 1356  BP: (!) 94/45  (!) 93/53 (!) 112/46  Pulse: 79  67 67  Resp: 15  12 18   Temp:  99.2 F (37.3 C)  97.7 F (36.5 C)  TempSrc:  Oral  Oral  SpO2: 95%  96% 93%  Weight:      Height:        General: Pt is alert, awake, not in acute distress Cardiovascular: RRR, S1/S2 +, no rubs, no gallops Respiratory: CTA bilaterally, no wheezing, no rhonchi Abdominal: Soft, NT, ND, bowel sounds + Extremities: no edema, no cyanosis    The results of significant diagnostics from this hospitalization (including imaging, microbiology, ancillary and laboratory) are listed below for reference.     Microbiology: No results found for this or any previous visit (from the past 240 hours).   Labs: BNP (last 3 results) Recent Labs    09/15/24 0205  BNP 15.6   Basic Metabolic Panel: Recent Labs  Lab 09/15/24 0206  NA 135  K 3.9  CL 100  CO2 22  GLUCOSE 136*  BUN 17   CREATININE 1.22  CALCIUM  9.5  MG 1.4*  PHOS 3.2   CBC: Recent Labs  Lab 09/15/24 0206  WBC 17.8*  NEUTROABS 12.8*  HGB 13.8  HCT 41.9  MCV 91.9  PLT 292   Urinalysis    Component Value Date/Time   COLORURINE YELLOW 12/14/2023 0944   APPEARANCEUR CLEAR 12/14/2023 0944   LABSPEC 1.010 12/14/2023 0944   PHURINE 7.0 12/14/2023 0944   GLUCOSEU NEGATIVE 12/14/2023 0944   HGBUR NEGATIVE 12/14/2023 0944   BILIRUBINUR NEGATIVE 12/14/2023 0944   BILIRUBINUR neg 12/30/2014 1456   KETONESUR NEGATIVE 12/14/2023 0944   PROTEINUR trace 12/30/2014 1456   PROTEINUR 30 (A) 07/07/2009 1412   UROBILINOGEN 0.2 12/14/2023 0944   NITRITE NEGATIVE 12/14/2023 0944   LEUKOCYTESUR NEGATIVE 12/14/2023 0944   Sepsis Labs Recent Labs  Lab 09/15/24 0206  WBC 17.8*   Microbiology No results found for this or any previous visit (from the past 240 hours).   Time coordinating discharge: Over 30 minutes  SIGNED:   Elsie JAYSON [place], DO [facility] 09/15/2024, 3:58 PM Pager   If 7PM-7AM, please contact night-coverage [email or web address]

## 2024-09-15 NOTE — ED Notes (Signed)
 Pt has returned from CT scan. RN has resumed care at this time.     RN adjusted pt BP cuff 3 times with BP trending low. RN will alert EDP.  Pt endorses dizziness which he states may be from previous medication given earlier.

## 2024-09-15 NOTE — Plan of Care (Signed)
  Problem: Education: Goal: Knowledge of General Education information will improve Description: Including pain rating scale, medication(s)/side effects and non-pharmacologic comfort measures 09/15/2024 1824 by Charl Nyle LABOR, RN Outcome: Adequate for Discharge 09/15/2024 1538 by Charl Nyle LABOR, RN Outcome: Progressing   Problem: Health Behavior/Discharge Planning: Goal: Ability to manage health-related needs will improve 09/15/2024 1824 by Charl Nyle LABOR, RN Outcome: Adequate for Discharge 09/15/2024 1538 by Charl Nyle LABOR, RN Outcome: Progressing   Problem: Clinical Measurements: Goal: Ability to maintain clinical measurements within normal limits will improve 09/15/2024 1824 by Charl Nyle LABOR, RN Outcome: Adequate for Discharge 09/15/2024 1538 by Charl Nyle LABOR, RN Outcome: Progressing Goal: Will remain free from infection 09/15/2024 1824 by Charl Nyle LABOR, RN Outcome: Adequate for Discharge 09/15/2024 1538 by Charl Nyle LABOR, RN Outcome: Progressing Goal: Diagnostic test results will improve 09/15/2024 1824 by Charl Nyle LABOR, RN Outcome: Adequate for Discharge 09/15/2024 1538 by Charl Nyle LABOR, RN Outcome: Progressing Goal: Respiratory complications will improve 09/15/2024 1824 by Charl Nyle LABOR, RN Outcome: Adequate for Discharge 09/15/2024 1538 by Charl Nyle LABOR, RN Outcome: Progressing Goal: Cardiovascular complication will be avoided 09/15/2024 1824 by Charl Nyle LABOR, RN Outcome: Adequate for Discharge 09/15/2024 1538 by Charl Nyle LABOR, RN Outcome: Progressing   Problem: Activity: Goal: Risk for activity intolerance will decrease 09/15/2024 1824 by Charl Nyle LABOR, RN Outcome: Adequate for Discharge 09/15/2024 1538 by Charl Nyle LABOR, RN Outcome: Progressing   Problem: Nutrition: Goal: Adequate nutrition will be maintained 09/15/2024 1824 by Charl Nyle LABOR, RN Outcome: Adequate for Discharge 09/15/2024 1538 by Charl Nyle LABOR, RN Outcome:  Progressing   Problem: Coping: Goal: Level of anxiety will decrease 09/15/2024 1824 by Charl Nyle LABOR, RN Outcome: Adequate for Discharge 09/15/2024 1538 by Charl Nyle LABOR, RN Outcome: Progressing   Problem: Elimination: Goal: Will not experience complications related to bowel motility 09/15/2024 1824 by Charl Nyle LABOR, RN Outcome: Adequate for Discharge 09/15/2024 1538 by Charl Nyle LABOR, RN Outcome: Progressing Goal: Will not experience complications related to urinary retention 09/15/2024 1824 by Charl Nyle LABOR, RN Outcome: Adequate for Discharge 09/15/2024 1538 by Charl Nyle LABOR, RN Outcome: Progressing   Problem: Pain Managment: Goal: General experience of comfort will improve and/or be controlled 09/15/2024 1824 by Charl Nyle LABOR, RN Outcome: Adequate for Discharge 09/15/2024 1538 by Charl Nyle LABOR, RN Outcome: Progressing   Problem: Safety: Goal: Ability to remain free from injury will improve 09/15/2024 1824 by Charl Nyle LABOR, RN Outcome: Adequate for Discharge 09/15/2024 1538 by Charl Nyle LABOR, RN Outcome: Progressing   Problem: Skin Integrity: Goal: Risk for impaired skin integrity will decrease 09/15/2024 1824 by Charl Nyle LABOR, RN Outcome: Adequate for Discharge 09/15/2024 1538 by Charl Nyle LABOR, RN Outcome: Progressing

## 2024-09-15 NOTE — Progress Notes (Signed)
 PHARMACY - ANTICOAGULATION CONSULT NOTE  Pharmacy Consult for Heparin  Indication: pulmonary embolus  Allergies  Allergen Reactions   Sular Swelling    Patient Measurements: Height: 5' 9 (175.3 cm) Weight: 113.4 kg (250 lb) IBW/kg (Calculated) : 70.7 HEPARIN  DW (KG): 95.9  Vital Signs: Temp: 97.8 F (36.6 C) (12/01 0152) Temp Source: Oral (12/01 0152) BP: 88/54 (12/01 0400) Pulse Rate: 84 (12/01 0400)  Labs: Recent Labs    09/15/24 0206  HGB 13.8  HCT 41.9  PLT 292  CREATININE 1.22  TROPONINIHS 4    Estimated Creatinine Clearance: 81 mL/min (by C-G formula based on SCr of 1.22 mg/dL).   Medical History: Past Medical History:  Diagnosis Date   Anxiety    Arthritis 1 year   Depression    GERD (gastroesophageal reflux disease)    Hyperlipidemia    Hypertension    Pancreatitis    Sleep apnea 10/16/2018   Tobacco abuse     Medications:  No current facility-administered medications on file prior to encounter.   Current Outpatient Medications on File Prior to Encounter  Medication Sig Dispense Refill   ALPRAZolam  (XANAX ) 1 MG tablet TAKE 1 TABLET BY MOUTH THREE TIMES A DAY AS NEEDED 90 tablet 2   ascorbic acid (VITAMIN C) 500 MG tablet Take 500 mg by mouth daily.     aspirin  EC 81 MG tablet Take 1 tablet (81 mg total) by mouth daily. Swallow whole. 100 tablet 11   atenolol  (TENORMIN ) 100 MG tablet TAKE 1 TABLET BY MOUTH DAILY. MUST KEEP SCHEDULE APPT W/NEW PROVIDER FOR FUTURE REFILLS 30 tablet 11   atorvastatin  (LIPITOR ) 80 MG tablet TAKE 1 TABLET BY MOUTH EVERY DAY 30 tablet 11   baclofen  (LIORESAL ) 10 MG tablet Take 1 tablet (10 mg total) by mouth 3 (three) times daily. 30 each 2   celecoxib  (CELEBREX ) 200 MG capsule Take 1 capsule (200 mg total) by mouth 2 (two) times daily. 180 capsule 3   Cholecalciferol (VITAMIN D3) 250 MCG (10000 UT) capsule Take 20,000 Units by mouth daily.     Coenzyme Q10 100 MG capsule Take 100 mg by mouth daily.      cyanocobalamin   (VITAMIN B12) 500 MCG tablet Take 500 mcg by mouth daily.     fenofibrate  160 MG tablet TAKE 1 TABLET BY MOUTH EVERY DAY 30 tablet 11   icosapent  Ethyl (VASCEPA ) 1 g capsule TAKE 2 CAPSULES BY MOUTH 2 TIMES DAILY. 120 capsule 0   magnesium  gluconate (MAGONATE) 500 MG tablet Take 500 mg by mouth daily.     meclizine  (ANTIVERT ) 12.5 MG tablet TAKE 1 TABLET BY MOUTH 3 TIMES DAILY AS NEEDED. 40 tablet 1   Multiple Vitamin (MULTIVITAMIN WITH MINERALS) TABS tablet Take 2 tablets by mouth daily.     omeprazole  (PRILOSEC) 40 MG capsule TAKE 1 CAPSULE BY MOUTH EVERY DAY 30 capsule 11   PRESCRIPTION MEDICATION Pt uses CPAP Machine     sildenafil  (VIAGRA ) 100 MG tablet Take 0.5-1 tablets (50-100 mg total) by mouth daily as needed for erectile dysfunction. 30 tablet 11   tamsulosin  (FLOMAX ) 0.4 MG CAPS capsule Take 1 capsule (0.4 mg total) by mouth daily. 90 capsule 3   tirzepatide  (MOUNJARO ) 2.5 MG/0.5ML Pen Inject 2.5 mg into the skin once a week. 2 mL 11   valsartan  (DIOVAN ) 160 MG tablet Take 1 tablet (160 mg total) by mouth daily. 90 tablet 3     Assessment: 58 y.o. male with PE for heparin  Goal of Therapy:  Heparin  level 0.3-0.7 units/ml Monitor platelets by anticoagulation protocol: Yes   Plan:  Heparin  5000 units IV bolus, then start heparin  1700 units/hr Check heparin  level in 6 hours.   Dail Cordella Misty 09/15/2024,4:11 AM

## 2024-09-15 NOTE — Progress Notes (Signed)
 PHARMACY - ANTICOAGULATION CONSULT NOTE  Pharmacy Consult for Heparin  Indication: pulmonary embolus  Allergies  Allergen Reactions   Sular Swelling    nisoldipine   Ibuprofen  Other (See Comments)    GI Bleed    Patient Measurements: Height: 5' 9 (175.3 cm) Weight: 113.4 kg (250 lb) IBW/kg (Calculated) : 70.7 HEPARIN  DW (KG): 95.9  Vital Signs: Temp: 99.2 F (37.3 C) (12/01 0954) Temp Source: Oral (12/01 0954) BP: 93/53 (12/01 1100) Pulse Rate: 67 (12/01 1100)  Labs: Recent Labs    09/15/24 0206 09/15/24 0347 09/15/24 1310  HGB 13.8  --   --   HCT 41.9  --   --   PLT 292  --   --   HEPARINUNFRC  --   --  0.32  CREATININE 1.22  --   --   TROPONINIHS 4 4  --     Estimated Creatinine Clearance: 81 mL/min (by C-G formula based on SCr of 1.22 mg/dL).   Medical History: Past Medical History:  Diagnosis Date   Anxiety    Arthritis 1 year   Depression    GERD (gastroesophageal reflux disease)    Hyperlipidemia    Hypertension    Pancreatitis    Sleep apnea 10/16/2018   Tobacco abuse     Medications:  No current facility-administered medications on file prior to encounter.   Current Outpatient Medications on File Prior to Encounter  Medication Sig Dispense Refill   ALPRAZolam  (XANAX ) 1 MG tablet TAKE 1 TABLET BY MOUTH THREE TIMES A DAY AS NEEDED 90 tablet 2   ascorbic acid (VITAMIN C) 500 MG tablet Take 500 mg by mouth in the morning.     aspirin  EC 81 MG tablet Take 1 tablet (81 mg total) by mouth daily. Swallow whole. 100 tablet 11   atenolol  (TENORMIN ) 100 MG tablet Take 100 mg by mouth in the morning.     atorvastatin  (LIPITOR ) 80 MG tablet TAKE 1 TABLET BY MOUTH EVERY DAY 30 tablet 11   baclofen  (LIORESAL ) 10 MG tablet Take 1 tablet (10 mg total) by mouth 3 (three) times daily. 30 each 2   celecoxib  (CELEBREX ) 200 MG capsule Take 1 capsule (200 mg total) by mouth 2 (two) times daily. 180 capsule 3   Cholecalciferol (VITAMIN D3) 250 MCG (10000 UT) capsule  Take 20,000 Units by mouth daily.     Coenzyme Q10 100 MG capsule Take 100 mg by mouth daily.      cyanocobalamin  (VITAMIN B12) 500 MCG tablet Take 500 mcg by mouth daily.     fenofibrate  160 MG tablet TAKE 1 TABLET BY MOUTH EVERY DAY 30 tablet 11   icosapent  Ethyl (VASCEPA ) 1 g capsule TAKE 2 CAPSULES BY MOUTH 2 TIMES DAILY. 120 capsule 0   magnesium  gluconate (MAGONATE) 500 MG tablet Take 500 mg by mouth daily.     meclizine  (ANTIVERT ) 12.5 MG tablet TAKE 1 TABLET BY MOUTH 3 TIMES DAILY AS NEEDED. 40 tablet 1   Multiple Vitamin (MULTIVITAMIN WITH MINERALS) TABS tablet Take 2 tablets by mouth daily.     omeprazole  (PRILOSEC) 40 MG capsule TAKE 1 CAPSULE BY MOUTH EVERY DAY 30 capsule 11   sildenafil  (VIAGRA ) 100 MG tablet Take 0.5-1 tablets (50-100 mg total) by mouth daily as needed for erectile dysfunction. 30 tablet 11   tamsulosin  (FLOMAX ) 0.4 MG CAPS capsule Take 1 capsule (0.4 mg total) by mouth daily. 90 capsule 3   tirzepatide  (MOUNJARO ) 2.5 MG/0.5ML Pen Inject 2.5 mg into the skin once  a week. (Patient taking differently: Inject 2.5 mg into the skin every Sunday.) 2 mL 11   valsartan  (DIOVAN ) 160 MG tablet Take 1 tablet (160 mg total) by mouth daily. 90 tablet 3   [DISCONTINUED] atenolol  (TENORMIN ) 100 MG tablet TAKE 1 TABLET BY MOUTH DAILY. MUST KEEP SCHEDULE APPT W/NEW PROVIDER FOR FUTURE REFILLS (Patient not taking: Reported on 09/15/2024) 30 tablet 11   [DISCONTINUED] PRESCRIPTION MEDICATION Pt uses CPAP Machine       Assessment: 58 y.o. male with PE for heparin   12/1 PM update: HL 0.32 Hgb wnl PLT wnl  Goal of Therapy:  Heparin  level 0.3-0.7 units/ml Monitor platelets by anticoagulation protocol: Yes   Plan:  Continue heparin  1700 units/hr Check confirmatory heparin  level in 8 hours.  HL / CBC daily Monitor for signs of bleeding and pauses w/ gtt. Report to Pharmacy    Benedetta Elvan HECKLE, PharmD, BCPS Clinical Pharmacist 09/15/2024 1:46 PM  Contact: [phone] after  3 PM

## 2024-09-15 NOTE — Progress Notes (Signed)
 [facility]  EXPEDITER LEVEL LOADING ASSESSMENT NOTE  Patient Name: Tanner Gomez  DOB:May 14, 1966 Date of Admission: 09/15/2024  Date of Assessment:09/15/24   -------------------------------------------------------------------------------------------------------------------   Brief clinical summary: 58 yo male with a PMH of GERD, HTN, HLD, GAD, admiited with PE.    Is there Bed Availability at another [facility]? Yes  If yes, what facility: [name]  Level of Care Needed:  Yes  MD Agree to transfer: N/A  Patient agrees to transfer: No    -------------------------------------------------------------------------------------------------------------------  [facility] RN Expediter, Sharolyn JONETTA Batman Please contact us  directly via secure chat (search for [facility]) or by calling us  at [phone] [facility]).

## 2024-09-15 NOTE — ED Notes (Signed)
 NT provided pt fan

## 2024-09-15 NOTE — ED Notes (Signed)
 Pt states since receiving the medicine he can breathe better. Pt still endorses discomfort with deep breaths on the right side.

## 2024-09-15 NOTE — Plan of Care (Signed)

## 2024-09-15 NOTE — ED Provider Notes (Addendum)
 [facility] DE[facility] AT [facility] Provider Note   CSN: [id number] Arrival date & time: 09/15/24  9853     Patient presents with: Shortness of Breath   Tanner Gomez is a 58 y.o. male.   Presents to the emergency department for evaluation of shortness of breath.  Patient flew home from [place]  earlier today.  After getting home he started to feel progressively worsening shortness of breath, unable to sleep tonight.  Patient reports pain on the right side of the chest area which worsens with breaths and moving.  He reports that he did have a fall several days ago onto his right side, at that time mostly had right shoulder pain.       Prior to Admission medications   Medication Sig Start Date End Date Taking? Authorizing Provider  ALPRAZolam  (XANAX ) 1 MG tablet TAKE 1 TABLET BY MOUTH THREE TIMES A DAY AS NEEDED 08/05/24   Norleen Lynwood LELON, MD  ascorbic acid (VITAMIN C) 500 MG tablet Take 500 mg by mouth daily.    [provider]  aspirin  EC 81 MG tablet Take 1 tablet (81 mg total) by mouth daily. Swallow whole. 07/05/23   Norleen Lynwood LELON, MD  atenolol  (TENORMIN ) 100 MG tablet TAKE 1 TABLET BY MOUTH DAILY. MUST KEEP SCHEDULE APPT W/NEW PROVIDER FOR FUTURE REFILLS 12/26/23   Norleen Lynwood LELON, MD  atorvastatin  (LIPITOR ) 80 MG tablet TAKE 1 TABLET BY MOUTH EVERY DAY 09/03/24   Norleen Lynwood LELON, MD  baclofen  (LIORESAL ) 10 MG tablet Take 1 tablet (10 mg total) by mouth 3 (three) times daily. 04/11/24   Norleen Lynwood LELON, MD  celecoxib  (CELEBREX ) 200 MG capsule Take 1 capsule (200 mg total) by mouth 2 (two) times daily. 12/14/23   Norleen Lynwood LELON, MD  Cholecalciferol (VITAMIN D3) 250 MCG (10000 UT) capsule Take 20,000 Units by mouth daily.    [provider]  Coenzyme Q10 100 MG capsule Take 100 mg by mouth daily.     [provider]  cyanocobalamin  (VITAMIN B12) 500 MCG tablet Take 500 mcg by mouth daily.    [provider]  fenofibrate  160 MG tablet TAKE 1  TABLET BY MOUTH EVERY DAY 12/26/23   Norleen Lynwood LELON, MD  icosapent  Ethyl (VASCEPA ) 1 g capsule TAKE 2 CAPSULES BY MOUTH 2 TIMES DAILY. 09/05/24   Hilty, Vinie BROCKS, MD  magnesium  gluconate (MAGONATE) 500 MG tablet Take 500 mg by mouth daily.    [provider]  meclizine  (ANTIVERT ) 12.5 MG tablet TAKE 1 TABLET BY MOUTH 3 TIMES DAILY AS NEEDED. 09/03/24   Norleen Lynwood LELON, MD  Multiple Vitamin (MULTIVITAMIN WITH MINERALS) TABS tablet Take 2 tablets by mouth daily.    [provider]  omeprazole  (PRILOSEC) 40 MG capsule TAKE 1 CAPSULE BY MOUTH EVERY DAY 07/18/24   Norleen Lynwood LELON, MD  PRESCRIPTION MEDICATION Pt uses CPAP Machine    [provider]  sildenafil  (VIAGRA ) 100 MG tablet Take 0.5-1 tablets (50-100 mg total) by mouth daily as needed for erectile dysfunction. 07/25/24   Norleen Lynwood LELON, MD  tamsulosin  (FLOMAX ) 0.4 MG CAPS capsule Take 1 capsule (0.4 mg total) by mouth daily. 12/14/23   Norleen Lynwood LELON, MD  tirzepatide  (MOUNJARO ) 2.5 MG/0.5ML Pen Inject 2.5 mg into the skin once a week. 08/05/24   Norleen Lynwood LELON, MD  valsartan  (DIOVAN ) 160 MG tablet Take 1 tablet (160 mg total) by mouth daily. 04/11/24   Norleen Lynwood LELON, MD  Allergies: Sular    Review of Systems  Updated Vital Signs BP (!) 89/51   Pulse 83   Temp 97.8 F (36.6 C) (Oral)   Resp 16   Ht 5' 9 (1.753 m)   Wt 113.4 kg   SpO2 92%   BMI 36.92 kg/m   Physical Exam Vitals and nursing note reviewed.  Constitutional:      General: He is not in acute distress.    Appearance: He is well-developed.  HENT:     Head: Normocephalic and atraumatic.     Mouth/Throat:     Mouth: Mucous membranes are moist.  Eyes:     General: Vision grossly intact. Gaze aligned appropriately.     Extraocular Movements: Extraocular movements intact.     Conjunctiva/sclera: Conjunctivae normal.  Cardiovascular:     Rate and Rhythm: Normal rate and regular rhythm.     Pulses: Normal pulses.     Heart sounds: Normal heart  sounds, S1 normal and S2 normal. No murmur heard.    No friction rub. No gallop.  Pulmonary:     Effort: Pulmonary effort is normal. No respiratory distress.     Breath sounds: Decreased breath sounds present.  Abdominal:     Palpations: Abdomen is soft.     Tenderness: There is no abdominal tenderness. There is no guarding or rebound.     Hernia: No hernia is present.  Musculoskeletal:        General: No swelling.     Cervical back: Full passive range of motion without pain, normal range of motion and neck supple. No pain with movement, spinous process tenderness or muscular tenderness. Normal range of motion.     Right lower leg: No edema.     Left lower leg: No edema.  Skin:    General: Skin is warm and dry.     Capillary Refill: Capillary refill takes less than 2 seconds.     Findings: No ecchymosis, erythema, lesion or wound.  Neurological:     Mental Status: He is alert and oriented to person, place, and time.     GCS: GCS eye subscore is 4. GCS verbal subscore is 5. GCS motor subscore is 6.     Cranial Nerves: Cranial nerves 2-12 are intact.     Sensory: Sensation is intact.     Motor: Motor function is intact. No weakness or abnormal muscle tone.     Coordination: Coordination is intact.  Psychiatric:        Mood and Affect: Mood normal.        Speech: Speech normal.        Behavior: Behavior normal.     (all labs ordered are listed, but only abnormal results are displayed) Labs Reviewed  CBC WITH DIFFERENTIAL/PLATELET - Abnormal; Notable for the following components:      Result Value   WBC 17.8 (*)    Neutro Abs 12.8 (*)    Monocytes Absolute 1.3 (*)    Abs Immature Granulocytes 0.10 (*)    All other components within normal limits  BASIC METABOLIC PANEL WITH GFR - Abnormal; Notable for the following components:   Glucose, Bld 136 (*)    All other components within normal limits  BRAIN NATRIURETIC PEPTIDE  TROPONIN I (HIGH SENSITIVITY)  TROPONIN I (HIGH  SENSITIVITY)    EKG: EKG Interpretation Date/Time:  Monday September 15 2024 02:04:07 EST Ventricular Rate:  90 PR Interval:  155 QRS Duration:  109 QT Interval:  369 QTC Calculation:  452 R Axis:   -19  Text Interpretation: Sinus rhythm Borderline left axis deviation No acute changes Confirmed by Haze Lonni PARAS [id number]) on 09/15/2024 2:48:55 AM  Radiology: CT Angio Chest Pulmonary Embolism (PE) W or WO Contrast Result Date: 09/15/2024 EXAM: CTA CHEST 09/15/2024 03:24:11 AM TECHNIQUE: CTA of the chest was performed after the administration of 125 mL of iohexol  (OMNIPAQUE ) 350 MG/ML injection. Multiplanar reformatted images are provided for review. MIP images are provided for review. Automated exposure control, iterative reconstruction, and/or weight based adjustment of the mA/kV was utilized to reduce the radiation dose to as low as reasonably achievable. COMPARISON: Portable chest from today, chest radiograph 12/12/2017, and chest CT for low dose lung cancer screening protocol from 05/30/2024 and 05/28/2023. CLINICAL HISTORY: Pulmonary embolism (PE) suspected, high prob. Chest pain and shortness of breath. FINDINGS: PULMONARY ARTERIES: Pulmonary arteries are adequately opacified for evaluation. There is an embolic filling defect in a single subsegmental branch artery in the posterior segment of the right upper lobe, best seen on series 7 axial 48. No further arterial emboli are seen. There is no arterial dilatation. No findings of an acute right heart strain. MEDIASTINUM: The heart is slightly enlarged. The pericardium demonstrates no acute abnormality. There is moderate atherosclerosis in the aorta and great vessels without aneurysm, stenosis, or dissection. There are left main and heavy for age 50-vessel coronary artery calcifications. The pulmonary veins are nondilated. LYMPH NODES: No mediastinal, hilar or axillary lymphadenopathy. LUNGS AND PLEURA: There is respiratory motion limiting fine  detail in the lungs. There is mild diffuse bronchial thickening. Mild apical centrilobular emphysema. There is diffuse mosaic attenuation of the lungs consistent with air trapping likely related to small airway disease. There is no consolidation, effusion, or pneumothorax or visible nodules. UPPER ABDOMEN: In the abdomen the liver is moderately steatotic with enlargement, with no acute findings. SOFT TISSUES AND BONES: There are mild features of thoracic spondylosis. No acute or significant osseous findings. No mass in the visualized chest wall. No acute soft tissue abnormality. IMPRESSION: 1. Acute subsegmental pulmonary embolism in a solitary posterior segmental branch of the right upper lobe, without CT evidence of right heart strain. 2. No other acute chest CTA or CT findings . 3. Left main and heavy for age 49 vessel coronary calcifications. Aortic atherosclerosis. 4. Diffuse bronchial thickening and mosaicism. No focal pneumonic infiltrate. 5. Moderately steatotic liver. Electronically signed by: Francis Quam MD 09/15/2024 03:53 AM EST RP Workstation: [id number]   DG Chest Port 1 View Result Date: 09/15/2024 EXAM: 1 VIEW XRAY OF THE CHEST 09/15/2024 02:28:33 AM COMPARISON: 2 / 27 / 19. CLINICAL HISTORY: chest pain, shortness of breath FINDINGS: LUNGS AND PLEURA: Low lung volumes. No focal pulmonary opacity. No pleural effusion. No pneumothorax. HEART AND MEDIASTINUM: Aortic atherosclerosis. BONES AND SOFT TISSUES: No acute osseous abnormality. IMPRESSION: 1. No acute cardiopulmonary process. Electronically signed by: Norman Gatlin MD 09/15/2024 02:43 AM EST RP Workstation: [id number]     Procedures   Medications Ordered in the ED  morphine (PF) 4 MG/ML injection 4 mg (4 mg Intravenous Given 09/15/24 0209)  ondansetron (ZOFRAN) injection 4 mg (4 mg Intravenous Given 09/15/24 0209)  methocarbamol (ROBAXIN) injection 500 mg (500 mg Intravenous Given 09/15/24 0251)  iohexol  (OMNIPAQUE ) 350 MG/ML  injection 125 mL (125 mLs Intravenous Contrast Given 09/15/24 0324)  sodium chloride  0.9 % bolus 1,000 mL (1,000 mLs Intravenous New Bag/Given 09/15/24 0345)  Medical Decision Making Amount and/or Complexity of Data Reviewed Labs: ordered. Decision-making details documented in ED Course. Radiology: ordered and independent interpretation performed. Decision-making details documented in ED Course. ECG/medicine tests: ordered and independent interpretation performed. Decision-making details documented in ED Course.  Risk Prescription drug management. Decision regarding hospitalization.   Differential Diagnosis considered includes, but not limited to: STEMI; NSTEMI; myocarditis; pericarditis; pulmonary embolism; aortic dissection; pneumothorax; pneumonia; gastritis; musculoskeletal pain  Presents to the emergency department for evaluation of right-sided chest pain and some shortness of breath.  Patient reports that he did fall several days ago, landing on his right side.  He did not have this pain after the fall, however.  Patient just traveled back from [place]  by plane.  After getting off the plane he started having this pain in the right rib cage area which is causing him to have difficulty breathing.  Examination on arrival did reveal some tenderness and pain with motion of the chest wall, chest wall pain from the fall considered.  Patient given morphine, still having what appears to be intermittent spasms.  Given a muscle relaxer.  Patient sent for CT angiography to further evaluate.  No pneumothorax or rib fractures, patient does have a subsegmental pulmonary embolism WITHOUT heart strain.  Patient now hypotensive.  This is potentially medication effect, as he was not hypotensive at arrival.  Given IV fluids.  Will hospitalized for anticoagulation and pain management.   CRITICAL CARE Performed by: Lonni JINNY Seats   Total critical care time: 30  minutes  Critical care time was exclusive of separately billable procedures and treating other patients.  Critical care was necessary to treat or prevent imminent or life-threatening deterioration.  Critical care was time spent personally by me on the following activities: development of treatment plan with patient and/or surrogate as well as nursing, discussions with consultants, evaluation of patient's response to treatment, examination of patient, obtaining history from patient or surrogate, ordering and performing treatments and interventions, ordering and review of laboratory studies, ordering and review of radiographic studies, pulse oximetry and re-evaluation of patient's condition.   Final diagnoses:  Single subsegmental pulmonary embolism without acute cor pulmonale [facility])    ED Discharge Orders     None          Seats Lonni JINNY, MD 09/15/24 [id number]    Seats Lonni JINNY, MD 09/15/24 [phone]

## 2024-09-15 NOTE — Progress Notes (Signed)
 BLE venous duplex has been completed.   Results can be found under chart review under CV PROC. 09/15/2024 9:05 AM Jemery Stacey RVT, RDMS

## 2024-09-15 NOTE — Telephone Encounter (Signed)
 Patient Product/process Development Scientist completed.    The patient is insured through [employer]. Patient has [employer], may use a copay card, and/or apply for patient assistance if available.    Ran test claim for Eliquis 5 mg and the current 30 day co-pay is $50.00.  Ran test claim for Xarelto 20 mg and the current 30 day co-pay is $50.00.   This test claim was processed through [facility]- copay amounts may vary at other pharmacies due to pharmacy/plan contracts, or as the patient moves through the different stages of their insurance plan.     Reyes Sharps, CPHT Pharmacy Technician Patient Advocate Specialist [facility] Patient Advocate Team Direct Number: [phone]  Fax: [phone]

## 2024-09-15 NOTE — H&P (Signed)
 History and Physical      Tanner Gomez FMW:[id number] DOB: [birth year] DOA: 09/15/2024; DOS: 09/15/2024  PCP: Tanner Lynwood LELON, MD  Patient coming from: home   I have personally briefly reviewed patient's old medical records in [facility]  Chief Complaint: Right-sided chest pain  HPI: Tanner Gomez is a 58 y.o. male with medical history significant for GERD, send hypertension, hyperlipidemia, generalized anxiety disorder, who is admitted to [facility] on 09/15/2024 with acute pulmonary embolism after presenting from home to [facility] complaining of right-sided chest pain.   The patient reports that he recently traveled via airplane to and from [place]  within the last 2 weeks.  On [facility], he notes that he experienced a ground-level mechanical fall, falling on the right portion of his chest.  He conveys that he did not hit his head as given this fall, and that the fall was not associate with any loss of consciousness.  over the last 3 to 4 days, he notes sharp, nonradiating right sided chest discomfort worse with deep inspiration, nonexertional, and denies any associated left-sided chest pain.  Has been associated some mild shortness of breath.  Denies any associated diaphoresis or palpitations.  No known history of prior PE or DVT.  No recent surgical procedures or known history of underlying malignancy.  Is on a daily baby aspirin  as an outpatient in the context of history of coronary artery disease, with most recent left-sided heart cath occurring during the first week of October 2024, which showed 100% occlusion of the mid RCA with evidence of collateral flow, but otherwise demonstrated evidence of nonobstructive CAD.  Denies any recent subjective fever, chills, rigors, or generalized myalgias.  He also notes some mild swelling involving the left lower extremity over the last few days.  Denies any associate orthopnea or PND.    ED Course:  Vital signs in the ED were notable for the  following: Afebrile; heart rates in the 80s; systolic blood pressures initially in the 120s, although systolic blood pressures decreased into the 80s after administration of morphine  and naproxen , before subsequently increasing into the 1 teens following IV fluids, as further detailed below; respiratory rate 14-25, oxygen saturation 94 to 99% on room air.  Labs were notable for the following: BMP was notable for the following: Potassium 3.9, creatinine 1.22, glucose 136.  BNP 15.6.  High sensitive troponin I x 2 are both out of before.  CBC notable for white cell count 17,800, hemoglobin 13.8.  Platelet count 292.  Per my interpretation, EKG in ED demonstrated the following: Sinus rhythm with heart rate 90, nonspecific T wave inversion in aVL, no evidence of Tanner changes, including no evidence of Tanner elevation.  Imaging in the ED, per corresponding formal radiology read, was notable for the following: 1 view chest x-ray showed nones of acute cardiopulmonary process.  CTA chest with PE protocol showed evidence of acute pulmonary embolism involving the segmental branch of the right upper lobe, without CTA evidence of right heart strain, and no evidence of rib fracture, infiltrate, edema, effusion, or pneumothorax.  While in the ED, the following were administered: Heparin  bolus followed by initiation of heparin  drip.  Robaxin  500 mg IV x 1 dose, morphine  4 mg IV x 1 dose, Zofran  4 mg IV x 1 dose, normal saline x 1 L bolus.  Subsequently, the patient was admitted for further evaluation management of presenting acute pulmonary embolism after presenting with atypical right-sided chest pain.  Review of Systems: As per HPI otherwise 10 point review of systems negative.   Past Medical History:  Diagnosis Date   Anxiety    Arthritis 1 year   Depression    GERD (gastroesophageal reflux disease)    Hyperlipidemia    Hypertension    Pancreatitis    Sleep apnea 10/16/2018   Tobacco abuse     Past  Surgical History:  Procedure Laterality Date   EYE SURGERY     RIGHT (CHILDHOOD)   FRACTURE SURGERY     JOINT REPLACEMENT     LEFT HEART CATH AND CORONARY ANGIOGRAPHY N/A 07/30/2023   Procedure: LEFT HEART CATH AND CORONARY ANGIOGRAPHY;  Surgeon: Jordan, Peter M, MD;  Location: [facility] INVASIVE CV LAB;  Service: Cardiovascular;  Laterality: N/A;    Social History:  reports that he has been smoking cigarettes. He has a 74 pack-year smoking history. He has never used smokeless tobacco. He reports current alcohol use of about 33.0 standard drinks of alcohol per week. He reports that he does not use drugs.   Allergies  Allergen Reactions   Ibuprofen  Other (See Comments)    GI Bleed   Sular Swelling    Family History  Problem Relation Age of Onset   Kidney disease Mother    Hypertension Mother    Heart disease Mother    Heart attack Mother    Diabetes Father    Hypertension Father    Kidney Stones Sister    Heart attack Maternal Grandmother    Heart attack Paternal Grandfather    Colon cancer Neg Hx     Family history reviewed and not pertinent    Prior to Admission medications   Medication Sig Start Date End Date Taking? Authorizing Provider  ALPRAZolam  (XANAX ) 1 MG tablet TAKE 1 TABLET BY MOUTH THREE TIMES A DAY AS NEEDED 08/05/24  Yes Tanner Lynwood ORN, MD  ascorbic acid (VITAMIN C) 500 MG tablet Take 500 mg by mouth in the morning.   Yes [provider]  aspirin  EC 81 MG tablet Take 1 tablet (81 mg total) by mouth daily. Swallow whole. 07/05/23  Yes Tanner Lynwood ORN, MD  atenolol  (TENORMIN ) 100 MG tablet Take 100 mg by mouth in the morning.   Yes [provider]  atorvastatin  (LIPITOR ) 80 MG tablet TAKE 1 TABLET BY MOUTH EVERY DAY 09/03/24  Yes Tanner Lynwood ORN, MD  baclofen  (LIORESAL ) 10 MG tablet Take 1 tablet (10 mg total) by mouth 3 (three) times daily. 04/11/24  Yes Tanner Lynwood ORN, MD  Cholecalciferol (VITAMIN D3) 250 MCG (10000 UT) capsule Take 20,000 Units by mouth  daily.   Yes [provider]  Coenzyme Q10 100 MG capsule Take 100 mg by mouth daily.    Yes [provider]  cyanocobalamin  (VITAMIN B12) 500 MCG tablet Take 500 mcg by mouth daily.   Yes [provider]  fenofibrate  160 MG tablet TAKE 1 TABLET BY MOUTH EVERY DAY 12/26/23  Yes Tanner Lynwood ORN, MD  icosapent  Ethyl (VASCEPA ) 1 g capsule TAKE 2 CAPSULES BY MOUTH 2 TIMES DAILY. 09/05/24  Yes Hilty, Vinie BROCKS, MD  magnesium  gluconate (MAGONATE) 500 MG tablet Take 500 mg by mouth daily.   Yes [provider]  meclizine  (ANTIVERT ) 12.5 MG tablet TAKE 1 TABLET BY MOUTH 3 TIMES DAILY AS NEEDED. 09/03/24  Yes Tanner Lynwood ORN, MD  Multiple Vitamin (MULTIVITAMIN WITH MINERALS) TABS tablet Take 2 tablets by mouth daily.   Yes [provider]  omeprazole  (  PRILOSEC) 40 MG capsule TAKE 1 CAPSULE BY MOUTH EVERY DAY 07/18/24  Yes Tanner Lynwood ORN, MD  sildenafil  (VIAGRA ) 100 MG tablet Take 0.5-1 tablets (50-100 mg total) by mouth daily as needed for erectile dysfunction. 07/25/24  Yes Tanner Lynwood ORN, MD  tamsulosin  (FLOMAX ) 0.4 MG CAPS capsule Take 1 capsule (0.4 mg total) by mouth daily. 12/14/23  Yes Tanner Lynwood ORN, MD  tirzepatide  (MOUNJARO ) 2.5 MG/0.5ML Pen Inject 2.5 mg into the skin once a week. Patient taking differently: Inject 2.5 mg into the skin every Sunday. 08/05/24  Yes Tanner Lynwood ORN, MD  valsartan  (DIOVAN ) 160 MG tablet Take 1 tablet (160 mg total) by mouth daily. 04/11/24  Yes Tanner Lynwood ORN, MD  celecoxib  (CELEBREX ) 200 MG capsule Take 1 capsule (200 mg total) by mouth 2 (two) times daily. 12/14/23   Tanner Lynwood ORN, MD     Objective    Physical Exam: Vitals:   09/15/24 0345 09/15/24 0400 09/15/24 0430 09/15/24 0445  BP: (!) 89/51 (!) 88/54 97/61 100/60  Pulse: 83 84 81 81  Resp: 16 20 17 15   Temp:      TempSrc:      SpO2: 92% 92% 94% 92%  Weight:      Height:        General: appears to be stated age; alert, oriented Skin: warm, dry, no rash Head:   AT/ Mouth:  Oral mucosa membranes appear moist, normal dentition Neck: supple; trachea midline Heart:  RRR; did not appreciate any M/R/G Lungs: CTAB, did not appreciate any wheezes, rales, or rhonchi Abdomen: + BS; soft, ND, NT Vascular: 2+ pedal pulses b/l; 2+ radial pulses b/l Extremities: non-pitting edema associated with LLE;  no muscle wasting         Labs on Admission: I have personally reviewed following labs and imaging studies  CBC: Recent Labs  Lab 09/15/24 0206  WBC 17.8*  NEUTROABS 12.8*  HGB 13.8  HCT 41.9  MCV 91.9  PLT 292   Basic Metabolic Panel: Recent Labs  Lab 09/15/24 0206  NA 135  K 3.9  CL 100  CO2 22  GLUCOSE 136*  BUN 17  CREATININE 1.22  CALCIUM  9.5   GFR: Estimated Creatinine Clearance: 81 mL/min (by C-G formula based on SCr of 1.22 mg/dL). Liver Function Tests: No results for input(s): AST, ALT, ALKPHOS, BILITOT, PROT, ALBUMIN in the last 168 hours. No results for input(s): LIPASE, AMYLASE in the last 168 hours. No results for input(s): AMMONIA in the last 168 hours. Coagulation Profile: No results for input(s): INR, PROTIME in the last 168 hours. Cardiac Enzymes: No results for input(s): CKTOTAL, CKMB, CKMBINDEX, TROPONINI in the last 168 hours. BNP (last 3 results) Recent Labs    03/28/24 0919  PROBNP 33.0   HbA1C: No results for input(s): HGBA1C in the last 72 hours. CBG: No results for input(s): GLUCAP in the last 168 hours. Lipid Profile: No results for input(s): CHOL, HDL, LDLCALC, TRIG, CHOLHDL, LDLDIRECT in the last 72 hours. Thyroid  Function Tests: No results for input(s): TSH, T4TOTAL, FREET4, T3FREE, THYROIDAB in the last 72 hours. Anemia Panel: No results for input(s): VITAMINB12, FOLATE, FERRITIN, TIBC, IRON, RETICCTPCT in the last 72 hours. Urine analysis:    Component Value Date/Time   COLORURINE YELLOW 12/14/2023 0944   APPEARANCEUR  CLEAR 12/14/2023 0944   LABSPEC 1.010 12/14/2023 0944   PHURINE 7.0 12/14/2023 0944   GLUCOSEU NEGATIVE 12/14/2023 0944   HGBUR NEGATIVE 12/14/2023 0944   BILIRUBINUR NEGATIVE 12/14/2023 0944  BILIRUBINUR neg 12/30/2014 1456   KETONESUR NEGATIVE 12/14/2023 0944   PROTEINUR trace 12/30/2014 1456   PROTEINUR 30 (A) 07/07/2009 1412   UROBILINOGEN 0.2 12/14/2023 0944   NITRITE NEGATIVE 12/14/2023 0944   LEUKOCYTESUR NEGATIVE 12/14/2023 0944    Radiological Exams on Admission: CT Angio Chest Pulmonary Embolism (PE) W or WO Contrast Result Date: 09/15/2024 EXAM: CTA CHEST 09/15/2024 03:24:11 AM TECHNIQUE: CTA of the chest was performed after the administration of 125 mL of iohexol  (OMNIPAQUE ) 350 MG/ML injection. Multiplanar reformatted images are provided for review. MIP images are provided for review. Automated exposure control, iterative reconstruction, and/or weight based adjustment of the mA/kV was utilized to reduce the radiation dose to as low as reasonably achievable. COMPARISON: Portable chest from today, chest radiograph 12/12/2017, and chest CT for low dose lung cancer screening protocol from 05/30/2024 and 05/28/2023. CLINICAL HISTORY: Pulmonary embolism (PE) suspected, high prob. Chest pain and shortness of breath. FINDINGS: PULMONARY ARTERIES: Pulmonary arteries are adequately opacified for evaluation. There is an embolic filling defect in a single subsegmental branch artery in the posterior segment of the right upper lobe, best seen on series 7 axial 48. No further arterial emboli are seen. There is no arterial dilatation. No findings of an acute right heart strain. MEDIASTINUM: The heart is slightly enlarged. The pericardium demonstrates no acute abnormality. There is moderate atherosclerosis in the aorta and great vessels without aneurysm, stenosis, or dissection. There are left main and heavy for age 175-vessel coronary artery calcifications. The pulmonary veins are nondilated. LYMPH  NODES: No mediastinal, hilar or axillary lymphadenopathy. LUNGS AND PLEURA: There is respiratory motion limiting fine detail in the lungs. There is mild diffuse bronchial thickening. Mild apical centrilobular emphysema. There is diffuse mosaic attenuation of the lungs consistent with air trapping likely related to small airway disease. There is no consolidation, effusion, or pneumothorax or visible nodules. UPPER ABDOMEN: In the abdomen the liver is moderately steatotic with enlargement, with no acute findings. SOFT TISSUES AND BONES: There are mild features of thoracic spondylosis. No acute or significant osseous findings. No mass in the visualized chest wall. No acute soft tissue abnormality. IMPRESSION: 1. Acute subsegmental pulmonary embolism in a solitary posterior segmental branch of the right upper lobe, without CT evidence of right heart strain. 2. No other acute chest CTA or CT findings . 3. Left main and heavy for age 89 vessel coronary calcifications. Aortic atherosclerosis. 4. Diffuse bronchial thickening and mosaicism. No focal pneumonic infiltrate. 5. Moderately steatotic liver. Electronically signed by: Francis Quam MD 09/15/2024 03:53 AM EST RP Workstation: [id number]   DG Chest Port 1 View Result Date: 09/15/2024 EXAM: 1 VIEW XRAY OF THE CHEST 09/15/2024 02:28:33 AM COMPARISON: 2 / 27 / 19. CLINICAL HISTORY: chest pain, shortness of breath FINDINGS: LUNGS AND PLEURA: Low lung volumes. No focal pulmonary opacity. No pleural effusion. No pneumothorax. HEART AND MEDIASTINUM: Aortic atherosclerosis. BONES AND SOFT TISSUES: No acute osseous abnormality. IMPRESSION: 1. No acute cardiopulmonary process. Electronically signed by: Norman Gatlin MD 09/15/2024 02:43 AM EST RP Workstation: [id number]      Assessment/Plan   Principal Problem:   Acute pulmonary embolism (HCC) Active Problems:   HLD (hyperlipidemia)   GERD (gastroesophageal reflux disease)   GAD (generalized anxiety disorder)    Right-sided chest pain   Leukocytosis   History of essential hypertension      #) Acute pulmonary embolism: In the setting of 3 to 4 days of new onset right sided pleuritic chest discomfort, associated with mild shortness of  breath, CTA chest with PE protocol shows evidence of acute pulmonary embolism involving segmental branch of the right upper lobe, without CTA evidence to suggest right heart strain.  Appears to be the patient's first PE, also noting no history of prior DVT.  Risk factors for acute PE for this patient include his recent travel history, as above, as well as recent trauma in the form of ground-level mechanical fall.  No evidence of hypoxia in the ED.  While he did transiently become hypotensive in the ED, he was initially noted to be normotensive with development of hypotension only after receiving Robaxin and morphine.  Clinically, this transient hypotension, which is now resolved with IV fluids, appears more likely to be pharmacologic in nature, as opposed to representing hypotension due to his acute pulmonary embolism.  However, we will continue to closely monitor and seeing hemodynamic status.  He also notes recent development of swelling in the left lower extremity.  Will pursue venous ultrasound of the bilateral lower extremities to assess for any lower extremity DVT.  Started on heparin  drip in the ED.  Plan: Continue heparin  drip.  Echocardiogram ordered for the morning.  Venous ultrasound of the bilateral lower extremities, as above.  prn Norco.  Prn IV fentanyl  for pain not relieved by Norco.  Incentive spirometry.  Normal saline x 1 L bolus followed by continuous NS at 100 cc/h x 12 hours.                     #) Leukocytosis: Mildly elevated white blood cell count of 17,800.  No evidence of underlying factious process at this time, including CTA chest which showed no evidence of infiltrate to suggest pneumonia.  Suspect that his cytosis is  reactive/inflammatory in nature in the context of his new diagnosis of acute pulmonary embolism.  However, will also assess urinalysis.  Refraining from initiation of antibiotics for now.  In the absence of evidence of underlying factious process, criteria for sepsis are not currently met.  Plan: Check urinalysis.  Monitor strict I's and O's and daily weights.  Further evaluation management of presenting acute pulmonary embolism, as above.                      #) GERD: documented h/o such; additionally, patient reports a history of peptic ulcer disease, noting that he has a bleeding stomach ulcer several years ago while taking high-dose NSAIDs.  Denies any ensuing evidence of gastrointestinal bleed since discontinuing NSAIDs.  on omeprazole  as outpatient.  Consequently, we will refrain from NSAIDs as component of pain management for his presenting right-sided pulmonary embolism.  Plan: continue home PPI.                         #) Essential Hypertension: documented h/o such, with outpatient antihypertensive regimen including atenolol , valsartan .  Given transient hypotension in the ED, will hold next doses of home antihypertensive medications.  Plan: Close monitoring of subsequent BP via routine VS. hold next doses of atenolol  and valsartan  as further detailed above.  Monitor strict I's and O's and daily weights.  Monitor on telemetry.                         #) Hyperlipidemia: documented h/o such. On high intensity Lipitor  in addition to fenofibrate  and Vascepa .  As outpatient.   Plan: continue home statin, Vascepa , as well as fenofibrate .SABRA                         #)  Generalized anxiety disorder: documented h/o such. On as needed Xanax  as outpatient.    Plan: Continue outpatient as needed Xanax .      DVT prophylaxis: Heparin  drip Code Status: Full code Family Communication: none Disposition Plan: Per  Rounding Team Consults called: none;  Admission status: Observation     I SPENT GREATER THAN 75  MINUTES IN CLINICAL CARE TIME/MEDICAL DECISION-MAKING IN COMPLETING THIS ADMISSION.      Eva NOVAK Peggi Yono DO [facility]  From 7PM - 7AM   09/15/2024, 5:56 AM

## 2024-09-15 NOTE — ED Notes (Signed)
RN and NT slid pt up in bed. ?

## 2024-09-15 NOTE — ED Triage Notes (Signed)
 Pt BIB [facility] from home c/o R shoulder blade pain after fall last week. Coming home after a plane ride today from [place] , pt was beginning to get short of breath as the R sided flank pain increased.   2 albuterol  tx 99% RA 132/100 90HR

## 2024-09-15 NOTE — Progress Notes (Signed)
 The patient is discharged from the hospital. He was accompanied by a family member and the NT escorted him in a wheel chair. Patient was in stable condition.

## 2024-09-15 NOTE — ED Notes (Signed)
 EDP ordered a bolus of NS

## 2024-09-15 NOTE — ED Notes (Signed)
 MD notified of pts soft blood pressures this morning

## 2024-09-15 NOTE — Discharge Instructions (Addendum)
 Information on my medicine - ELIQUIS (apixaban)  This medication education was reviewed with me or my healthcare representative as part of my discharge preparation.  Why was Eliquis prescribed for you? Eliquis was prescribed to treat blood clots that may have been found in the veins of your legs (deep vein thrombosis) or in your lungs (pulmonary embolism) and to reduce the risk of them occurring again.  What do You need to know about Eliquis ? The starting dose is 10 mg (two 5 mg tablets) taken TWICE daily for the FIRST SEVEN (7) DAYS, then on  12/8 AM  the dose is reduced to ONE 5 mg tablet taken TWICE daily.  Eliquis may be taken with or without food.   Try to take the dose about the same time in the morning and in the evening. If you have difficulty swallowing the tablet whole please discuss with your pharmacist how to take the medication safely.  Take Eliquis exactly as prescribed and DO NOT stop taking Eliquis without talking to the doctor who prescribed the medication.  Stopping may increase your risk of developing a new blood clot.  Refill your prescription before you run out.  After discharge, you should have regular check-up appointments with your healthcare provider that is prescribing your Eliquis.    What do you do if you miss a dose? If a dose of ELIQUIS is not taken at the scheduled time, take it as soon as possible on the same day and twice-daily administration should be resumed. The dose should not be doubled to make up for a missed dose.  Important Safety Information A possible side effect of Eliquis is bleeding. You should call your healthcare provider right away if you experience any of the following: Bleeding from an injury or your nose that does not stop. Unusual colored urine (red or dark brown) or unusual colored stools (red or black). Unusual bruising for unknown reasons. A serious fall or if you hit your head (even if there is no bleeding).  Some medicines  may interact with Eliquis and might increase your risk of bleeding or clotting while on Eliquis. To help avoid this, consult your healthcare provider or pharmacist prior to using any new prescription or non-prescription medications, including herbals, vitamins, non-steroidal anti-inflammatory drugs (NSAIDs) and supplements.  This website has more information on Eliquis (apixaban): http://www.eliquis.com/eliquis/home

## 2024-09-15 NOTE — ED Notes (Signed)
 Pt educated on to use incentive spirometer. Pt states he would like to start intervention at 07:00 d/t just getting comfortable.

## 2024-09-15 NOTE — Progress Notes (Signed)
 PHARMACY - ANTICOAGULATION CONSULT NOTE  Pharmacy Consult for Heparin  transition to apixaban Indication: pulmonary embolus  Allergies  Allergen Reactions   Sular Swelling    nisoldipine   Ibuprofen  Other (See Comments)    GI Bleed    Patient Measurements: Height: 5' 9 (175.3 cm) Weight: 113.4 kg (250 lb) IBW/kg (Calculated) : 70.7 HEPARIN  DW (KG): 95.9  Vital Signs: Temp: 99.2 F (37.3 C) (12/01 0954) Temp Source: Oral (12/01 0954) BP: 93/53 (12/01 1100) Pulse Rate: 67 (12/01 1100)  Labs: Recent Labs    09/15/24 0206 09/15/24 0347 09/15/24 1310  HGB 13.8  --   --   HCT 41.9  --   --   PLT 292  --   --   HEPARINUNFRC  --   --  0.32  CREATININE 1.22  --   --   TROPONINIHS 4 4  --     Estimated Creatinine Clearance: 81 mL/min (by C-G formula based on SCr of 1.22 mg/dL).   Medical History: Past Medical History:  Diagnosis Date   Anxiety    Arthritis 1 year   Depression    GERD (gastroesophageal reflux disease)    Hyperlipidemia    Hypertension    Pancreatitis    Sleep apnea 10/16/2018   Tobacco abuse     Medications:  No current facility-administered medications on file prior to encounter.   Current Outpatient Medications on File Prior to Encounter  Medication Sig Dispense Refill   ALPRAZolam  (XANAX ) 1 MG tablet TAKE 1 TABLET BY MOUTH THREE TIMES A DAY AS NEEDED 90 tablet 2   ascorbic acid (VITAMIN C) 500 MG tablet Take 500 mg by mouth in the morning.     aspirin  EC 81 MG tablet Take 1 tablet (81 mg total) by mouth daily. Swallow whole. 100 tablet 11   atenolol  (TENORMIN ) 100 MG tablet Take 100 mg by mouth in the morning.     atorvastatin  (LIPITOR ) 80 MG tablet TAKE 1 TABLET BY MOUTH EVERY DAY 30 tablet 11   baclofen  (LIORESAL ) 10 MG tablet Take 1 tablet (10 mg total) by mouth 3 (three) times daily. 30 each 2   celecoxib  (CELEBREX ) 200 MG capsule Take 1 capsule (200 mg total) by mouth 2 (two) times daily. 180 capsule 3   Cholecalciferol (VITAMIN D3) 250  MCG (10000 UT) capsule Take 20,000 Units by mouth daily.     Coenzyme Q10 100 MG capsule Take 100 mg by mouth daily.      cyanocobalamin  (VITAMIN B12) 500 MCG tablet Take 500 mcg by mouth daily.     fenofibrate  160 MG tablet TAKE 1 TABLET BY MOUTH EVERY DAY 30 tablet 11   icosapent  Ethyl (VASCEPA ) 1 g capsule TAKE 2 CAPSULES BY MOUTH 2 TIMES DAILY. 120 capsule 0   magnesium  gluconate (MAGONATE) 500 MG tablet Take 500 mg by mouth daily.     meclizine  (ANTIVERT ) 12.5 MG tablet TAKE 1 TABLET BY MOUTH 3 TIMES DAILY AS NEEDED. 40 tablet 1   Multiple Vitamin (MULTIVITAMIN WITH MINERALS) TABS tablet Take 2 tablets by mouth daily.     omeprazole  (PRILOSEC) 40 MG capsule TAKE 1 CAPSULE BY MOUTH EVERY DAY 30 capsule 11   sildenafil  (VIAGRA ) 100 MG tablet Take 0.5-1 tablets (50-100 mg total) by mouth daily as needed for erectile dysfunction. 30 tablet 11   tamsulosin  (FLOMAX ) 0.4 MG CAPS capsule Take 1 capsule (0.4 mg total) by mouth daily. 90 capsule 3   tirzepatide  (MOUNJARO ) 2.5 MG/0.5ML Pen Inject 2.5 mg into  the skin once a week. (Patient taking differently: Inject 2.5 mg into the skin every Sunday.) 2 mL 11   valsartan  (DIOVAN ) 160 MG tablet Take 1 tablet (160 mg total) by mouth daily. 90 tablet 3   [DISCONTINUED] atenolol  (TENORMIN ) 100 MG tablet TAKE 1 TABLET BY MOUTH DAILY. MUST KEEP SCHEDULE APPT W/NEW PROVIDER FOR FUTURE REFILLS (Patient not taking: Reported on 09/15/2024) 30 tablet 11   [DISCONTINUED] PRESCRIPTION MEDICATION Pt uses CPAP Machine       Assessment: 58 y.o. male with PE for heparin   12/1 PM update: HL 0.32 Hgb wnl PLT wnl  Goal of Therapy:  Monitor platelets by anticoagulation protocol: Yes   Plan:  Stop heparin  infusion Start apixaban 10 mg po bid x7 days f/b 5 mg po bid thereafter Monitor for signs of bleeding.    Benedetta Heath BS, PharmD, BCPS Clinical Pharmacist 09/15/2024 2:07 PM  Contact: [phone] after 3 PM

## 2024-09-15 NOTE — Progress Notes (Signed)
  Echocardiogram 2D Echocardiogram has been performed.  Tanner Gomez 09/15/2024, 12:06 PM

## 2024-09-15 NOTE — ED Notes (Signed)
 RN educated pt on benefits and risks of taking heparin . RN provided pt call bell and placed urinal at bedside. Pt verbalized understanding to use call bell to alert RN when he needs to walk to restroom.

## 2024-09-16 ENCOUNTER — Encounter: Payer: Self-pay | Admitting: Internal Medicine

## 2024-09-17 ENCOUNTER — Telehealth: Payer: Self-pay

## 2024-09-17 NOTE — Transitions of Care (Post Inpatient/ED Visit) (Signed)
 09/17/2024  Name: Tanner Gomez MRN: [id number] DOB: July 30, 1966  Today's TOC FU Call Status: Today's TOC FU Call Status:: Successful TOC FU Call Completed TOC FU Call Complete Date: 09/17/24  Patient's Name and Date of Birth confirmed. Name, DOB  Transition Care Management Follow-up Telephone Call Date of Discharge: 09/15/24 Discharge Facility: [facility]) Type of Discharge: Inpatient Admission Primary Inpatient Discharge Diagnosis:: PE How have you been since you were released from the hospital?: Same Any questions or concerns?: No  Items Reviewed: Did you receive and understand the discharge instructions provided?: Yes Medications obtained,verified, and reconciled?: Yes (Medications Reviewed) Any new allergies since your discharge?: No Dietary orders reviewed?: Yes Do you have support at home?: Yes People in Home [RPT]: significant other  Medications Reviewed Today: Medications Reviewed Today     Reviewed by Emmitt Pan, LPN (Licensed Practical Nurse) on 09/17/24 at 1410  Med List Status: <None>   Medication Order Taking? Sig Documenting Provider Last Dose Status Informant  ALPRAZolam  (XANAX ) 1 MG tablet [id number] Yes TAKE 1 TABLET BY MOUTH THREE TIMES A DAY AS NEEDED Norleen Lynwood LELON, MD  Active Self, Pharmacy Records  APIXABAN  (ELIQUIS ) VTE STARTER PACK (10MG  AND 5MG ) [id number] Yes Take 2 tablets (10 mg) by mouth 2 (two) times daily for 7 days, THEN 1 tablet (5 mg) 2 (two) times daily thereafter Lue Elsie BROCKS, MD  Active   ascorbic acid (VITAMIN C) 500 MG tablet [id number] Yes Take 500 mg by mouth in the morning. [provider]  Active Self, Pharmacy Records  atorvastatin  (LIPITOR ) 80 MG tablet [id number] Yes TAKE 1 TABLET BY MOUTH EVERY DAY Norleen Lynwood LELON, MD  Active Self, Pharmacy Records  baclofen  (LIORESAL ) 10 MG tablet [id number] Yes Take 1 tablet (10 mg total) by mouth 3 (three) times daily. Norleen Lynwood LELON, MD  Active Self, Pharmacy Records   Cholecalciferol (VITAMIN D3) 250 MCG (10000 UT) capsule [id number] Yes Take 20,000 Units by mouth daily. [provider]  Active Self, Pharmacy Records  Coenzyme Q10 100 MG capsule [id number] Yes Take 100 mg by mouth daily.  [provider]  Active Self, Pharmacy Records  cyanocobalamin  (VITAMIN B12) 500 MCG tablet [id number] Yes Take 500 mcg by mouth daily. [provider]  Active Self, Pharmacy Records  fenofibrate  160 MG tablet [id number] Yes TAKE 1 TABLET BY MOUTH EVERY DAY Norleen Lynwood LELON, MD  Active Self, Pharmacy Records  HYDROcodone -acetaminophen  (NORCO/VICODIN) 5-325 MG tablet [id number] Yes Take 1-2 tablets by mouth every 4 (four) hours as needed for severe pain (pain score 7-10). Lue Elsie BROCKS, MD  Active   icosapent  Ethyl (VASCEPA ) 1 g capsule [id number] Yes TAKE 2 CAPSULES BY MOUTH 2 TIMES DAILY. Mona Vinie BROCKS, MD  Active Self, Pharmacy Records  magnesium  gluconate (MAGONATE) 500 MG tablet [id number] Yes Take 500 mg by mouth daily. [provider]  Active Self, Pharmacy Records  meclizine  (ANTIVERT ) 12.5 MG tablet [id number] Yes TAKE 1 TABLET BY MOUTH 3 TIMES DAILY AS NEEDED. Norleen Lynwood LELON, MD  Active Self, Pharmacy Records  Multiple Vitamin (MULTIVITAMIN WITH MINERALS) TABS tablet [id number] Yes Take 2 tablets by mouth daily. [provider]  Active Self, Pharmacy Records  omeprazole  [facility]) 40 MG capsule [id number] Yes TAKE 1 CAPSULE BY MOUTH EVERY DAY Norleen Lynwood LELON, MD  Active Self, Pharmacy Records  ondansetron  (ZOFRAN ) 4 MG tablet [id number] Yes Take 1 tablet (4 mg total) by mouth daily as needed for nausea or vomiting. Lue Elsie BROCKS, MD  Active  sildenafil  (VIAGRA ) 100 MG tablet [id number] Yes Take 0.5-1 tablets (50-100 mg total) by mouth daily as needed for erectile dysfunction. Norleen Lynwood ORN, MD  Active Self, Pharmacy Records  tamsulosin  (FLOMAX ) 0.4 MG CAPS capsule [id number] Yes Take 1 capsule (0.4 mg total) by mouth daily. Norleen Lynwood ORN, MD  Active Self, Pharmacy Records  tirzepatide  (MOUNJARO ) 2.5 MG/0.5ML Pen [id number] Yes Inject 2.5 mg into the skin once a week.  Patient taking differently: Inject 2.5 mg into the skin every Sunday.   Norleen Lynwood ORN, MD  Active Self, Pharmacy Records  Med List Note Leobardo Garre, CPhT 09/15/24 [id number]): CPAP at home.             Home Care and Equipment/Supplies: Were Home Health Services Ordered?: NA Any new equipment or medical supplies ordered?: NA  Functional Questionnaire: Do you need assistance with bathing/showering or dressing?: No Do you need assistance with meal preparation?: No Do you need assistance with eating?: No Do you have difficulty maintaining continence: No Do you need assistance with getting out of bed/getting out of a chair/moving?: No Do you have difficulty managing or taking your medications?: No  Follow up appointments reviewed: PCP Follow-up appointment confirmed?: Yes Date of PCP follow-up appointment?: 09/18/24 Follow-up Provider: [facility] Follow-up appointment confirmed?: NA Do you need transportation to your follow-up appointment?: No Do you understand care options if your condition(s) worsen?: Yes-patient verbalized understanding    SIGNATURE Julian Lemmings, LPN [facility] Nurse Health Advisor Direct Dial [phone]

## 2024-09-17 NOTE — Telephone Encounter (Signed)
 Pt has been scheduled for hospital follow up on 09/18/24.

## 2024-09-18 ENCOUNTER — Ambulatory Visit: Admitting: Internal Medicine

## 2024-09-18 ENCOUNTER — Encounter: Payer: Self-pay | Admitting: Internal Medicine

## 2024-09-18 VITALS — BP 160/88 | HR 86 | Temp 98.2°F | Ht 69.0 in | Wt 247.0 lb

## 2024-09-18 DIAGNOSIS — I10 Essential (primary) hypertension: Secondary | ICD-10-CM

## 2024-09-18 DIAGNOSIS — I2510 Atherosclerotic heart disease of native coronary artery without angina pectoris: Secondary | ICD-10-CM

## 2024-09-18 DIAGNOSIS — R42 Dizziness and giddiness: Secondary | ICD-10-CM

## 2024-09-18 DIAGNOSIS — R079 Chest pain, unspecified: Secondary | ICD-10-CM

## 2024-09-18 DIAGNOSIS — I2699 Other pulmonary embolism without acute cor pulmonale: Secondary | ICD-10-CM

## 2024-09-18 MED ORDER — VALSARTAN 160 MG PO TABS: 160.0000 mg | ORAL_TABLET | Freq: Every day | ORAL | 3 refills | Status: AC

## 2024-09-18 MED ORDER — MECLIZINE HCL 12.5 MG PO TABS: 12.5000 mg | ORAL_TABLET | Freq: Three times a day (TID) | ORAL | 3 refills | Status: AC | PRN

## 2024-09-18 MED ORDER — ATENOLOL 100 MG PO TABS: 100.0000 mg | ORAL_TABLET | Freq: Every day | ORAL | 3 refills | Status: AC

## 2024-09-18 MED ORDER — BACLOFEN 10 MG PO TABS: 10.0000 mg | ORAL_TABLET | Freq: Three times a day (TID) | ORAL | 2 refills | Status: AC

## 2024-09-18 MED ORDER — TRAMADOL HCL 50 MG PO TABS: 50.0000 mg | ORAL_TABLET | Freq: Four times a day (QID) | ORAL | 0 refills | Status: AC | PRN

## 2024-09-18 MED ORDER — APIXABAN 5 MG PO TABS: 5.0000 mg | ORAL_TABLET | Freq: Two times a day (BID) | ORAL | 4 refills | Status: AC

## 2024-09-18 NOTE — Patient Instructions (Signed)
 Ok to finish the Eliquis  starter pack, then take Elqiuis 5 mg twice per day for 5 more months, then stop  Ok to restart the Aspirin  81 mg after the eliquis .    Please take all new medication as prescribed - the tramadol  as needed for pain  Please continue all other medications as before, and refills have been done for the meclizine  as needed, and restarting the atenolol  and valsartan  as before  Please have the pharmacy call with any other refills you may need.  Please keep your appointments with your specialists as you may have planned  You are given the work note

## 2024-09-18 NOTE — Progress Notes (Unsigned)
 Patient ID: Tanner Gomez, male   DOB: 12/07/1965, 58 y.o.   MRN: [id number]        Chief Complaint: follow up ED visit Sep 15 2024       HPI:  Tanner Gomez is a 58 y.o. male here with c/o        Wt Readings from Last 3 Encounters:  09/18/24 247 lb (112 kg)  09/15/24 250 lb (113.4 kg)  06/13/24 250 lb (113.4 kg)   BP Readings from Last 3 Encounters:  09/18/24 (!) 160/88  09/15/24 (!) (P) 122/106  06/13/24 126/78         Past Medical History:  Diagnosis Date   Anxiety    Arthritis 1 year   Depression    GERD (gastroesophageal reflux disease)    Hyperlipidemia    Hypertension    Pancreatitis    Sleep apnea 10/16/2018   Tobacco abuse    Past Surgical History:  Procedure Laterality Date   EYE SURGERY     RIGHT (CHILDHOOD)   FRACTURE SURGERY     JOINT REPLACEMENT     LEFT HEART CATH AND CORONARY ANGIOGRAPHY N/A 07/30/2023   Procedure: LEFT HEART CATH AND CORONARY ANGIOGRAPHY;  Surgeon: Jordan, Peter M, MD;  Location: [facility] INVASIVE CV [facility];  Service: Cardiovascular;  Laterality: N/A;    reports that he has been smoking cigarettes. He has a 74 pack-year smoking history. He has never used smokeless tobacco. He reports current alcohol use of about 33.0 standard drinks of alcohol per week. He reports that he does not use drugs. family history includes Diabetes in his father; Heart attack in his maternal grandmother, mother, and paternal grandfather; Heart disease in his mother; Hypertension in his father and mother; Kidney Stones in his sister; Kidney disease in his mother. Allergies  Allergen Reactions   Sular Swelling    nisoldipine   Ibuprofen  Other (See Comments)    GI Bleed   Current Outpatient Medications on File Prior to Visit  Medication Sig Dispense Refill   ALPRAZolam  (XANAX ) 1 MG tablet TAKE 1 TABLET BY MOUTH THREE TIMES A DAY AS NEEDED 90 tablet 2   APIXABAN  (ELIQUIS ) VTE STARTER PACK (10MG  AND 5MG ) Take 2 tablets (10 mg) by mouth 2 (two) times daily for 7 days, THEN 1  tablet (5 mg) 2 (two) times daily thereafter 74 tablet 0   ascorbic acid (VITAMIN C) 500 MG tablet Take 500 mg by mouth in the morning.     atorvastatin  (LIPITOR ) 80 MG tablet TAKE 1 TABLET BY MOUTH EVERY DAY 30 tablet 11   baclofen  (LIORESAL ) 10 MG tablet Take 1 tablet (10 mg total) by mouth 3 (three) times daily. 30 each 2   Cholecalciferol (VITAMIN D3) 250 MCG (10000 UT) capsule Take 20,000 Units by mouth daily.     Coenzyme Q10 100 MG capsule Take 100 mg by mouth daily.      cyanocobalamin  (VITAMIN B12) 500 MCG tablet Take 500 mcg by mouth daily.     fenofibrate  160 MG tablet TAKE 1 TABLET BY MOUTH EVERY DAY 30 tablet 11   HYDROcodone -acetaminophen  (NORCO/VICODIN) 5-325 MG tablet Take 1-2 tablets by mouth every 4 (four) hours as needed for severe pain (pain score 7-10). 30 tablet 0   icosapent  Ethyl (VASCEPA ) 1 g capsule TAKE 2 CAPSULES BY MOUTH 2 TIMES DAILY. 120 capsule 0   magnesium  gluconate (MAGONATE) 500 MG tablet Take 500 mg by mouth daily.     meclizine  (ANTIVERT ) 12.5 MG tablet TAKE  1 TABLET BY MOUTH 3 TIMES DAILY AS NEEDED. 40 tablet 1   Multiple Vitamin (MULTIVITAMIN WITH MINERALS) TABS tablet Take 2 tablets by mouth daily.     omeprazole  (PRILOSEC) 40 MG capsule TAKE 1 CAPSULE BY MOUTH EVERY DAY 30 capsule 11   ondansetron  (ZOFRAN ) 4 MG tablet Take 1 tablet (4 mg total) by mouth daily as needed for nausea or vomiting. 30 tablet 1   sildenafil  (VIAGRA ) 100 MG tablet Take 0.5-1 tablets (50-100 mg total) by mouth daily as needed for erectile dysfunction. 30 tablet 11   tamsulosin  (FLOMAX ) 0.4 MG CAPS capsule Take 1 capsule (0.4 mg total) by mouth daily. 90 capsule 3   tirzepatide  (MOUNJARO ) 2.5 MG/0.5ML Pen Inject 2.5 mg into the skin once a week. (Patient taking differently: Inject 2.5 mg into the skin every Sunday.) 2 mL 11   No current facility-administered medications on file prior to visit.        ROS:  All others reviewed and negative.  Objective        PE:  BP (!) 160/88  (BP Location: Left Arm, Patient Position: Sitting, Cuff Size: Normal)   Pulse 86   Temp 98.2 F (36.8 C) (Oral)   Ht 5' 9 (1.753 m)   Wt 247 lb (112 kg)   SpO2 96%   BMI 36.48 kg/m                 Constitutional: Pt appears in NAD               HENT: Head: NCAT.                Right Ear: External ear normal.                 Left Ear: External ear normal.                Eyes: . Pupils are equal, round, and reactive to light. Conjunctivae and EOM are normal               Nose: without d/c or deformity               Neck: Neck supple. Gross normal ROM               Cardiovascular: Normal rate and regular rhythm.                 Pulmonary/Chest: Effort normal and breath sounds without rales or wheezing.                Abd:  Soft, NT, ND, + BS, no organomegaly               Neurological: Pt is alert. At baseline orientation, motor grossly intact               Skin: Skin is warm. No rashes, no other new lesions, LE edema - ***               Psychiatric: Pt behavior is normal without agitation   Micro: none  Cardiac tracings I have personally interpreted today:  none  Pertinent Radiological findings (summarize): none   Lab Results  Component Value Date   WBC 17.8 (H) 09/15/2024   HGB 13.8 09/15/2024   HCT 41.9 09/15/2024   PLT 292 09/15/2024   GLUCOSE 136 (H) 09/15/2024   CHOL 126 06/13/2024   TRIG 149.0 06/13/2024   HDL 28.60 (L) 06/13/2024   LDLDIRECT 82.0 06/14/2023   LDLCALC  67 06/13/2024   ALT 36 06/13/2024   AST 50 (H) 06/13/2024   NA 135 09/15/2024   K 3.9 09/15/2024   CL 100 09/15/2024   CREATININE 1.22 09/15/2024   BUN 17 09/15/2024   CO2 22 09/15/2024   TSH 4.19 12/14/2023   PSA 0.12 12/14/2023   INR 1.61 (H) 03/07/2011   HGBA1C 6.9 (H) 06/13/2024   MICROALBUR <0.7 12/14/2023   Assessment/Plan:  Tanner Gomez is a 58 y.o. White or Caucasian [1] male with  has a past medical history of Anxiety, Arthritis (1 year), Depression, GERD (gastroesophageal reflux  disease), Hyperlipidemia, Hypertension, Pancreatitis, Sleep apnea (10/16/2018), and Tobacco abuse.  No problem-specific Assessment & Plan notes found for this encounter.  Followup: No follow-ups on file.  Lynwood Rush, MD 09/18/2024 4:10 PM [facility]

## 2024-09-20 ENCOUNTER — Encounter: Payer: Self-pay | Admitting: Internal Medicine

## 2024-09-20 NOTE — Assessment & Plan Note (Signed)
 Pt to restart asa after course of eliquis  x 6 mo

## 2024-09-20 NOTE — Assessment & Plan Note (Signed)
 Occurred dec 1, tolerating eliquis  well, has starter pack restarted, also for eliquis  5 mg bid x 5 months after initial starter pack

## 2024-09-20 NOTE — Assessment & Plan Note (Signed)
 Uncontrolled, pt to restart atenolol  100 mg every day, valsartan   BP Readings from Last 3 Encounters:  09/18/24 (!) 160/88  09/15/24 (!) (P) 122/106  06/13/24 126/78

## 2024-09-20 NOTE — Assessment & Plan Note (Signed)
 Also for tramadol  50 mg prn limited rx

## 2024-09-20 NOTE — Assessment & Plan Note (Signed)
 Mild recurrent, for meclizine  prn

## 2024-09-29 ENCOUNTER — Ambulatory Visit: Admitting: Internal Medicine

## 2024-09-29 ENCOUNTER — Other Ambulatory Visit: Payer: Self-pay

## 2024-09-29 ENCOUNTER — Other Ambulatory Visit (HOSPITAL_COMMUNITY): Payer: Self-pay

## 2024-10-10 ENCOUNTER — Other Ambulatory Visit: Payer: Self-pay | Admitting: Internal Medicine

## 2024-10-17 ENCOUNTER — Other Ambulatory Visit (HOSPITAL_COMMUNITY): Payer: Self-pay

## 2024-12-15 ENCOUNTER — Encounter: Admitting: Internal Medicine

## 2025-01-15 ENCOUNTER — Encounter: Admitting: Family Medicine
